# Patient Record
Sex: Male | Born: 1935 | Race: White | Hispanic: No | Marital: Single | Smoking: Former smoker
Health system: Southern US, Community
[De-identification: ages and names within clinical notes are randomized; demographics above are authoritative.]

## PROBLEM LIST (undated history)

## (undated) DIAGNOSIS — D649 Anemia, unspecified: Secondary | ICD-10-CM

## (undated) DIAGNOSIS — E876 Hypokalemia: Secondary | ICD-10-CM

## (undated) DIAGNOSIS — M1990 Unspecified osteoarthritis, unspecified site: Secondary | ICD-10-CM

## (undated) DIAGNOSIS — F0390 Unspecified dementia without behavioral disturbance: Secondary | ICD-10-CM

## (undated) DIAGNOSIS — I872 Venous insufficiency (chronic) (peripheral): Secondary | ICD-10-CM

## (undated) DIAGNOSIS — C4440 Unspecified malignant neoplasm of skin of scalp and neck: Secondary | ICD-10-CM

## (undated) DIAGNOSIS — J189 Pneumonia, unspecified organism: Secondary | ICD-10-CM

## (undated) DIAGNOSIS — I639 Cerebral infarction, unspecified: Secondary | ICD-10-CM

## (undated) DIAGNOSIS — E538 Deficiency of other specified B group vitamins: Secondary | ICD-10-CM

## (undated) DIAGNOSIS — Z8719 Personal history of other diseases of the digestive system: Secondary | ICD-10-CM

## (undated) DIAGNOSIS — E871 Hypo-osmolality and hyponatremia: Secondary | ICD-10-CM

## (undated) DIAGNOSIS — D485 Neoplasm of uncertain behavior of skin: Secondary | ICD-10-CM

## (undated) DIAGNOSIS — I714 Abdominal aortic aneurysm, without rupture, unspecified: Secondary | ICD-10-CM

## (undated) DIAGNOSIS — R634 Abnormal weight loss: Secondary | ICD-10-CM

## (undated) DIAGNOSIS — C679 Malignant neoplasm of bladder, unspecified: Secondary | ICD-10-CM

## (undated) DIAGNOSIS — C649 Malignant neoplasm of unspecified kidney, except renal pelvis: Secondary | ICD-10-CM

## (undated) DIAGNOSIS — I80299 Phlebitis and thrombophlebitis of other deep vessels of unspecified lower extremity: Secondary | ICD-10-CM

## (undated) DIAGNOSIS — E119 Type 2 diabetes mellitus without complications: Secondary | ICD-10-CM

## (undated) DIAGNOSIS — K625 Hemorrhage of anus and rectum: Secondary | ICD-10-CM

## (undated) DIAGNOSIS — M216X9 Other acquired deformities of unspecified foot: Secondary | ICD-10-CM

## (undated) DIAGNOSIS — I739 Peripheral vascular disease, unspecified: Secondary | ICD-10-CM

## (undated) DIAGNOSIS — K219 Gastro-esophageal reflux disease without esophagitis: Secondary | ICD-10-CM

## (undated) DIAGNOSIS — Z9181 History of falling: Secondary | ICD-10-CM

## (undated) DIAGNOSIS — M545 Low back pain: Secondary | ICD-10-CM

## (undated) DIAGNOSIS — N433 Hydrocele, unspecified: Secondary | ICD-10-CM

## (undated) DIAGNOSIS — M7020 Olecranon bursitis, unspecified elbow: Secondary | ICD-10-CM

## (undated) DIAGNOSIS — K5790 Diverticulosis of intestine, part unspecified, without perforation or abscess without bleeding: Secondary | ICD-10-CM

## (undated) DIAGNOSIS — R404 Transient alteration of awareness: Secondary | ICD-10-CM

## (undated) DIAGNOSIS — F329 Major depressive disorder, single episode, unspecified: Secondary | ICD-10-CM

## (undated) DIAGNOSIS — R609 Edema, unspecified: Secondary | ICD-10-CM

## (undated) DIAGNOSIS — I219 Acute myocardial infarction, unspecified: Secondary | ICD-10-CM

## (undated) DIAGNOSIS — I509 Heart failure, unspecified: Secondary | ICD-10-CM

## (undated) DIAGNOSIS — I10 Essential (primary) hypertension: Secondary | ICD-10-CM

## (undated) DIAGNOSIS — R269 Unspecified abnormalities of gait and mobility: Secondary | ICD-10-CM

## (undated) DIAGNOSIS — I4891 Unspecified atrial fibrillation: Secondary | ICD-10-CM

## (undated) DIAGNOSIS — I499 Cardiac arrhythmia, unspecified: Secondary | ICD-10-CM

## (undated) DIAGNOSIS — K5900 Constipation, unspecified: Secondary | ICD-10-CM

## (undated) DIAGNOSIS — E785 Hyperlipidemia, unspecified: Secondary | ICD-10-CM

## (undated) DIAGNOSIS — I2510 Atherosclerotic heart disease of native coronary artery without angina pectoris: Secondary | ICD-10-CM

## (undated) HISTORY — DX: Type 2 diabetes mellitus without complications: E11.9

## (undated) HISTORY — DX: Acute myocardial infarction, unspecified: I21.9

## (undated) HISTORY — DX: Abdominal aortic aneurysm, without rupture: I71.4

## (undated) HISTORY — DX: Constipation, unspecified: K59.00

## (undated) HISTORY — PX: APPENDECTOMY: SHX54

## (undated) HISTORY — DX: Personal history of other diseases of the digestive system: Z87.19

## (undated) HISTORY — DX: Unspecified atrial fibrillation: I48.91

## (undated) HISTORY — DX: Neoplasm of uncertain behavior of skin: D48.5

## (undated) HISTORY — DX: Hemorrhage of anus and rectum: K62.5

## (undated) HISTORY — DX: Heart failure, unspecified: I50.9

## (undated) HISTORY — DX: Peripheral vascular disease, unspecified: I73.9

## (undated) HISTORY — DX: Abdominal aortic aneurysm, without rupture, unspecified: I71.40

## (undated) HISTORY — DX: Edema, unspecified: R60.9

## (undated) HISTORY — DX: Low back pain: M54.5

## (undated) HISTORY — DX: Olecranon bursitis, unspecified elbow: M70.20

## (undated) HISTORY — DX: Unspecified abnormalities of gait and mobility: R26.9

## (undated) HISTORY — DX: Hydrocele, unspecified: N43.3

## (undated) HISTORY — DX: Transient alteration of awareness: R40.4

## (undated) HISTORY — DX: Hypo-osmolality and hyponatremia: E87.1

## (undated) HISTORY — DX: Unspecified dementia without behavioral disturbance: F03.90

## (undated) HISTORY — DX: Deficiency of other specified B group vitamins: E53.8

## (undated) HISTORY — DX: Other acquired deformities of unspecified foot: M21.6X9

## (undated) HISTORY — DX: Anemia, unspecified: D64.9

## (undated) HISTORY — PX: CORONARY ARTERY BYPASS GRAFT: SHX141

## (undated) HISTORY — DX: Malignant neoplasm of bladder, unspecified: C67.9

## (undated) HISTORY — DX: Pneumonia, unspecified organism: J18.9

## (undated) HISTORY — DX: Diverticulosis of intestine, part unspecified, without perforation or abscess without bleeding: K57.90

## (undated) HISTORY — DX: Malignant neoplasm of unspecified kidney, except renal pelvis: C64.9

## (undated) HISTORY — DX: Unspecified malignant neoplasm of skin of scalp and neck: C44.40

## (undated) HISTORY — DX: Cerebral infarction, unspecified: I63.9

## (undated) HISTORY — DX: Essential (primary) hypertension: I10

## (undated) HISTORY — DX: History of falling: Z91.81

## (undated) HISTORY — DX: Hypokalemia: E87.6

## (undated) HISTORY — DX: Venous insufficiency (chronic) (peripheral): I87.2

## (undated) HISTORY — DX: Abnormal weight loss: R63.4

## (undated) HISTORY — PX: EYE SURGERY: SHX253

## (undated) HISTORY — DX: Phlebitis and thrombophlebitis of other deep vessels of unspecified lower extremity: I80.299

## (undated) HISTORY — DX: Hyperlipidemia, unspecified: E78.5

## (undated) HISTORY — DX: Major depressive disorder, single episode, unspecified: F32.9

---

## 1979-06-02 DIAGNOSIS — I219 Acute myocardial infarction, unspecified: Secondary | ICD-10-CM

## 1979-06-02 DIAGNOSIS — I639 Cerebral infarction, unspecified: Secondary | ICD-10-CM

## 1979-06-02 HISTORY — DX: Cerebral infarction, unspecified: I63.9

## 1979-06-02 HISTORY — DX: Acute myocardial infarction, unspecified: I21.9

## 1992-06-01 HISTORY — PX: CORONARY ARTERY BYPASS GRAFT: SHX141

## 2004-06-01 HISTORY — PX: OTHER SURGICAL HISTORY: SHX169

## 2004-06-09 ENCOUNTER — Ambulatory Visit: Payer: Self-pay | Admitting: Internal Medicine

## 2004-08-18 ENCOUNTER — Ambulatory Visit: Payer: Self-pay | Admitting: Internal Medicine

## 2004-12-15 ENCOUNTER — Ambulatory Visit: Payer: Self-pay | Admitting: Internal Medicine

## 2004-12-22 ENCOUNTER — Ambulatory Visit: Payer: Self-pay | Admitting: Internal Medicine

## 2004-12-24 ENCOUNTER — Encounter: Admission: RE | Admit: 2004-12-24 | Discharge: 2004-12-24 | Payer: Self-pay | Admitting: Internal Medicine

## 2005-01-13 ENCOUNTER — Ambulatory Visit: Payer: Self-pay | Admitting: Internal Medicine

## 2005-04-08 DIAGNOSIS — C649 Malignant neoplasm of unspecified kidney, except renal pelvis: Secondary | ICD-10-CM

## 2005-04-08 HISTORY — DX: Malignant neoplasm of unspecified kidney, except renal pelvis: C64.9

## 2005-05-06 ENCOUNTER — Encounter: Admission: RE | Admit: 2005-05-06 | Discharge: 2005-05-06 | Payer: Self-pay | Admitting: Urology

## 2005-05-11 ENCOUNTER — Encounter (INDEPENDENT_AMBULATORY_CARE_PROVIDER_SITE_OTHER): Payer: Self-pay | Admitting: Specialist

## 2005-05-11 ENCOUNTER — Ambulatory Visit (HOSPITAL_COMMUNITY): Admission: RE | Admit: 2005-05-11 | Discharge: 2005-05-11 | Payer: Self-pay | Admitting: Urology

## 2005-05-11 ENCOUNTER — Ambulatory Visit (HOSPITAL_BASED_OUTPATIENT_CLINIC_OR_DEPARTMENT_OTHER): Admission: RE | Admit: 2005-05-11 | Discharge: 2005-05-11 | Payer: Self-pay | Admitting: Urology

## 2005-06-01 DIAGNOSIS — Z8719 Personal history of other diseases of the digestive system: Secondary | ICD-10-CM

## 2005-06-01 HISTORY — DX: Personal history of other diseases of the digestive system: Z87.19

## 2005-06-01 HISTORY — PX: CATARACT EXTRACTION: SUR2

## 2005-10-19 ENCOUNTER — Inpatient Hospital Stay (HOSPITAL_COMMUNITY): Admission: EM | Admit: 2005-10-19 | Discharge: 2005-10-20 | Payer: Self-pay | Admitting: Emergency Medicine

## 2005-10-22 ENCOUNTER — Ambulatory Visit: Payer: Self-pay | Admitting: Gastroenterology

## 2005-10-22 ENCOUNTER — Ambulatory Visit: Payer: Self-pay | Admitting: Internal Medicine

## 2005-11-04 ENCOUNTER — Ambulatory Visit: Payer: Self-pay | Admitting: Gastroenterology

## 2005-11-11 ENCOUNTER — Ambulatory Visit: Payer: Self-pay | Admitting: Gastroenterology

## 2005-11-27 ENCOUNTER — Encounter (INDEPENDENT_AMBULATORY_CARE_PROVIDER_SITE_OTHER): Payer: Self-pay | Admitting: *Deleted

## 2005-11-27 ENCOUNTER — Ambulatory Visit: Payer: Self-pay | Admitting: Gastroenterology

## 2005-11-27 LAB — HM COLONOSCOPY: HM Colonoscopy: ABNORMAL

## 2006-04-02 ENCOUNTER — Ambulatory Visit: Payer: Self-pay | Admitting: Internal Medicine

## 2006-07-19 ENCOUNTER — Ambulatory Visit: Payer: Self-pay | Admitting: Internal Medicine

## 2006-08-31 ENCOUNTER — Ambulatory Visit: Payer: Self-pay | Admitting: Internal Medicine

## 2006-12-13 ENCOUNTER — Ambulatory Visit (HOSPITAL_BASED_OUTPATIENT_CLINIC_OR_DEPARTMENT_OTHER): Admission: RE | Admit: 2006-12-13 | Discharge: 2006-12-13 | Payer: Self-pay | Admitting: Urology

## 2007-01-15 ENCOUNTER — Ambulatory Visit: Payer: Self-pay | Admitting: Family Medicine

## 2007-01-18 ENCOUNTER — Ambulatory Visit: Payer: Self-pay | Admitting: Internal Medicine

## 2007-01-19 ENCOUNTER — Ambulatory Visit: Payer: Self-pay | Admitting: Internal Medicine

## 2007-03-07 ENCOUNTER — Ambulatory Visit (HOSPITAL_BASED_OUTPATIENT_CLINIC_OR_DEPARTMENT_OTHER): Admission: RE | Admit: 2007-03-07 | Discharge: 2007-03-07 | Payer: Self-pay | Admitting: Urology

## 2007-04-01 ENCOUNTER — Ambulatory Visit: Payer: Self-pay | Admitting: Internal Medicine

## 2007-06-27 ENCOUNTER — Encounter: Payer: Self-pay | Admitting: Internal Medicine

## 2007-06-27 DIAGNOSIS — I10 Essential (primary) hypertension: Secondary | ICD-10-CM

## 2007-06-27 DIAGNOSIS — I719 Aortic aneurysm of unspecified site, without rupture: Secondary | ICD-10-CM | POA: Insufficient documentation

## 2007-06-27 DIAGNOSIS — I2510 Atherosclerotic heart disease of native coronary artery without angina pectoris: Secondary | ICD-10-CM

## 2007-06-27 DIAGNOSIS — K5730 Diverticulosis of large intestine without perforation or abscess without bleeding: Secondary | ICD-10-CM | POA: Insufficient documentation

## 2007-06-27 DIAGNOSIS — E1129 Type 2 diabetes mellitus with other diabetic kidney complication: Secondary | ICD-10-CM | POA: Insufficient documentation

## 2007-08-18 ENCOUNTER — Telehealth: Payer: Self-pay | Admitting: Internal Medicine

## 2007-09-27 ENCOUNTER — Encounter: Payer: Self-pay | Admitting: Internal Medicine

## 2007-11-18 ENCOUNTER — Telehealth: Payer: Self-pay | Admitting: Internal Medicine

## 2007-11-30 ENCOUNTER — Ambulatory Visit: Payer: Self-pay | Admitting: Internal Medicine

## 2007-12-01 DIAGNOSIS — E785 Hyperlipidemia, unspecified: Secondary | ICD-10-CM

## 2007-12-22 ENCOUNTER — Encounter: Payer: Self-pay | Admitting: Internal Medicine

## 2007-12-27 ENCOUNTER — Encounter: Payer: Self-pay | Admitting: Internal Medicine

## 2008-01-08 ENCOUNTER — Encounter: Payer: Self-pay | Admitting: Internal Medicine

## 2008-03-15 ENCOUNTER — Encounter: Payer: Self-pay | Admitting: Internal Medicine

## 2008-04-05 ENCOUNTER — Encounter: Payer: Self-pay | Admitting: Internal Medicine

## 2008-04-08 DIAGNOSIS — C679 Malignant neoplasm of bladder, unspecified: Secondary | ICD-10-CM

## 2008-04-08 HISTORY — DX: Malignant neoplasm of bladder, unspecified: C67.9

## 2008-04-18 ENCOUNTER — Encounter: Payer: Self-pay | Admitting: Internal Medicine

## 2008-06-28 ENCOUNTER — Encounter: Payer: Self-pay | Admitting: Internal Medicine

## 2008-08-15 ENCOUNTER — Telehealth: Payer: Self-pay | Admitting: Internal Medicine

## 2008-12-11 ENCOUNTER — Encounter: Payer: Self-pay | Admitting: Internal Medicine

## 2008-12-21 ENCOUNTER — Encounter: Admission: RE | Admit: 2008-12-21 | Discharge: 2008-12-21 | Payer: Self-pay | Admitting: Urology

## 2008-12-28 ENCOUNTER — Encounter (INDEPENDENT_AMBULATORY_CARE_PROVIDER_SITE_OTHER): Payer: Self-pay | Admitting: Urology

## 2008-12-28 ENCOUNTER — Ambulatory Visit (HOSPITAL_BASED_OUTPATIENT_CLINIC_OR_DEPARTMENT_OTHER): Admission: RE | Admit: 2008-12-28 | Discharge: 2008-12-28 | Payer: Self-pay | Admitting: Urology

## 2009-01-07 ENCOUNTER — Encounter: Payer: Self-pay | Admitting: Internal Medicine

## 2009-03-18 ENCOUNTER — Encounter: Payer: Self-pay | Admitting: Internal Medicine

## 2009-04-09 ENCOUNTER — Encounter: Payer: Self-pay | Admitting: Internal Medicine

## 2009-04-24 ENCOUNTER — Encounter: Payer: Self-pay | Admitting: Internal Medicine

## 2009-05-01 ENCOUNTER — Telehealth (INDEPENDENT_AMBULATORY_CARE_PROVIDER_SITE_OTHER): Payer: Self-pay | Admitting: *Deleted

## 2009-05-13 ENCOUNTER — Ambulatory Visit: Payer: Self-pay | Admitting: Internal Medicine

## 2009-05-14 DIAGNOSIS — C679 Malignant neoplasm of bladder, unspecified: Secondary | ICD-10-CM | POA: Insufficient documentation

## 2009-07-11 ENCOUNTER — Encounter: Payer: Self-pay | Admitting: Internal Medicine

## 2009-07-19 ENCOUNTER — Encounter: Payer: Self-pay | Admitting: Internal Medicine

## 2009-07-19 ENCOUNTER — Ambulatory Visit (HOSPITAL_BASED_OUTPATIENT_CLINIC_OR_DEPARTMENT_OTHER): Admission: RE | Admit: 2009-07-19 | Discharge: 2009-07-19 | Payer: Self-pay | Admitting: Urology

## 2009-07-30 ENCOUNTER — Encounter: Payer: Self-pay | Admitting: Internal Medicine

## 2009-08-28 ENCOUNTER — Telehealth: Payer: Self-pay | Admitting: Internal Medicine

## 2009-10-01 ENCOUNTER — Encounter: Payer: Self-pay | Admitting: Internal Medicine

## 2009-10-01 DIAGNOSIS — I2510 Atherosclerotic heart disease of native coronary artery without angina pectoris: Secondary | ICD-10-CM

## 2009-10-01 HISTORY — DX: Atherosclerotic heart disease of native coronary artery without angina pectoris: I25.10

## 2009-10-23 ENCOUNTER — Encounter: Payer: Self-pay | Admitting: Internal Medicine

## 2009-10-24 ENCOUNTER — Encounter: Payer: Self-pay | Admitting: Internal Medicine

## 2009-10-30 ENCOUNTER — Encounter: Payer: Self-pay | Admitting: Internal Medicine

## 2009-12-05 ENCOUNTER — Encounter: Payer: Self-pay | Admitting: Internal Medicine

## 2009-12-10 ENCOUNTER — Ambulatory Visit: Payer: Self-pay | Admitting: Internal Medicine

## 2010-01-30 ENCOUNTER — Encounter: Payer: Self-pay | Admitting: Internal Medicine

## 2010-02-21 ENCOUNTER — Encounter: Payer: Self-pay | Admitting: Internal Medicine

## 2010-02-21 ENCOUNTER — Ambulatory Visit (HOSPITAL_BASED_OUTPATIENT_CLINIC_OR_DEPARTMENT_OTHER): Admission: RE | Admit: 2010-02-21 | Discharge: 2010-02-21 | Payer: Self-pay | Admitting: Urology

## 2010-03-04 ENCOUNTER — Encounter: Payer: Self-pay | Admitting: Internal Medicine

## 2010-03-19 ENCOUNTER — Encounter: Payer: Self-pay | Admitting: Internal Medicine

## 2010-04-02 ENCOUNTER — Encounter: Payer: Self-pay | Admitting: Internal Medicine

## 2010-04-03 ENCOUNTER — Telehealth: Payer: Self-pay | Admitting: Internal Medicine

## 2010-04-04 ENCOUNTER — Ambulatory Visit: Payer: Self-pay | Admitting: Internal Medicine

## 2010-04-04 DIAGNOSIS — R279 Unspecified lack of coordination: Secondary | ICD-10-CM

## 2010-04-04 DIAGNOSIS — H8309 Labyrinthitis, unspecified ear: Secondary | ICD-10-CM | POA: Insufficient documentation

## 2010-04-08 ENCOUNTER — Ambulatory Visit: Payer: Self-pay | Admitting: Internal Medicine

## 2010-04-10 ENCOUNTER — Ambulatory Visit (HOSPITAL_COMMUNITY): Admission: RE | Admit: 2010-04-10 | Discharge: 2010-04-10 | Payer: Self-pay | Admitting: Internal Medicine

## 2010-04-10 ENCOUNTER — Encounter: Payer: Self-pay | Admitting: Internal Medicine

## 2010-04-15 ENCOUNTER — Encounter: Payer: Self-pay | Admitting: Internal Medicine

## 2010-04-18 ENCOUNTER — Ambulatory Visit: Payer: Self-pay | Admitting: Internal Medicine

## 2010-04-29 ENCOUNTER — Encounter: Payer: Self-pay | Admitting: Internal Medicine

## 2010-04-29 ENCOUNTER — Telehealth: Payer: Self-pay | Admitting: Internal Medicine

## 2010-04-30 ENCOUNTER — Encounter: Payer: Self-pay | Admitting: Internal Medicine

## 2010-05-02 ENCOUNTER — Encounter: Payer: Self-pay | Admitting: Internal Medicine

## 2010-05-20 ENCOUNTER — Ambulatory Visit: Payer: Self-pay | Admitting: Internal Medicine

## 2010-05-27 ENCOUNTER — Encounter: Payer: Self-pay | Admitting: Internal Medicine

## 2010-06-04 ENCOUNTER — Encounter: Payer: Self-pay | Admitting: Internal Medicine

## 2010-06-10 ENCOUNTER — Encounter: Payer: Self-pay | Admitting: Internal Medicine

## 2010-06-12 ENCOUNTER — Encounter: Payer: Self-pay | Admitting: Internal Medicine

## 2010-06-30 ENCOUNTER — Encounter
Admission: RE | Admit: 2010-06-30 | Discharge: 2010-06-30 | Payer: Self-pay | Source: Home / Self Care | Attending: Cardiovascular Disease | Admitting: Cardiovascular Disease

## 2010-07-03 NOTE — Miscellaneous (Signed)
Summary: Order/[facility]  Order/[facility]   Imported By: Lester [place] 04/04/2010 10:09:16  _____________________________________________________________________  External Attachment:    Type:   Image     Comment:   External Document

## 2010-07-03 NOTE — Miscellaneous (Signed)
Summary: Treatment plan/[facility]  Treatment plan/[facility]   Imported By: Sherian Rein 05/22/2010 07:46:42  _____________________________________________________________________  External Attachment:    Type:   Image     Comment:   External Document

## 2010-07-03 NOTE — Letter (Signed)
Summary: [facility]   Imported By: Sherian Rein 08/19/2009 08:31:59  _____________________________________________________________________  External Attachment:    Type:   Image     Comment:   External Document

## 2010-07-03 NOTE — Letter (Signed)
Summary: [facility]  [facility]   Imported By: Sherian Rein 06/12/2010 14:09:30  _____________________________________________________________________  External Attachment:    Type:   Image     Comment:   External Document

## 2010-07-03 NOTE — Letter (Signed)
Summary: [facility]  [facility]   Imported By: Lester [place] 07/18/2009 08:13:08  _____________________________________________________________________  External Attachment:    Type:   Image     Comment:   External Document

## 2010-07-03 NOTE — Letter (Signed)
Summary: [facility]  [facility]   Imported By: Sherian Rein 06/26/2010 10:55:57  _____________________________________________________________________  External Attachment:    Type:   Image     Comment:   External Document

## 2010-07-03 NOTE — Letter (Signed)
Summary: Medical Report forms/[facility]  Medical Report forms/[facility]   Imported By: Sherian Rein 12/18/2009 12:18:12  _____________________________________________________________________  External Attachment:    Type:   Image     Comment:   External Document

## 2010-07-03 NOTE — Assessment & Plan Note (Signed)
Summary: f/u to MRI/#/cd   Vital Signs:  Patient profile:   77 year old male Height:      69 inches Weight:      164 pounds BMI:     24.31 O2 Sat:      98 % on Room air Temp:     97.3 degrees F oral Pulse rate:   64 / minute BP sitting:   130 / 62  (left arm) Cuff size:   regular  Vitals Entered By: Bill Salinas CMA (April 18, 2010 11:23 AM)  O2 Flow:  Room air  Primary Care Provider:  Jacques Navy MD   History of Present Illness: [name] [name] returns for follow-up of his dizziness. He did have a negative MRI brain. He reports that the off balance issue remains but is intermittent and not severe.  HIs main concern is driving restrictions. A DMV form was completed that recommended no interstate drving, and no night-time driving. He is unhappy with this restriction of no night time driving. He reports that he took a driving test for his license and passed. He has seen his eye doctor and been given a clean bill of heath. When questioned I told him I restricted him to daytime driving due to the increased hazards of poor visibility in an elderly man with delayed, age approrpriate, reflexes. He does have a hearing Dec 6th in [place] with the [facility]  Current Medications (verified): 1)  Librium 10 Mg Caps (Chlordiazepoxide Hcl) .... Take 1 Tablet By Mouth Every 12 Hours As Needed 2)  Toprol Xl 50 Mg  Tb24 (Metoprolol Succinate) .... Take 1 By Mouth Qd 3)  Altace 10 Mg  Tabs (Ramipril) .... Take 1 By Mouth Qd 4)  Metformin Hcl 1000 Mg  Tabs (Metformin Hcl) .... Take 1 By Mouth Two Times A Day 5)  Norvasc 5 Mg  Tabs (Amlodipine Besylate) .... Take 1 By Mouth Qd 6)  Lipitor 10 Mg  Tabs (Atorvastatin Calcium) .... Take 1 Tablet By Mouth Once A Day 7)  Ecotrin Low Strength 81 Mg  Tbec (Aspirin) .... Take 1 Tablet By Mouth Once A Day 8)  Multivitamins   Tabs (Multiple Vitamin) .... Take 1 Tablet By Mouth Once A Day 9)  Vitamin C .... One Tablet Daily 10)  Levemir Flexpen 100 Unit/ml Soln  (Insulin Detemir) .... 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 - Dr. Tresa Endo                 GI        Dr. Lina Sar                 GU -    Dr. Ginette Pitman - Dr. Elmer Picker  Past Surgical History: Last updated: 11/30/2007 Coronary artery bypass graft-'94  LIMA-LAD, SVG-OM,PD, RCA Cataract extraction '07 EGD (11/27/2005) Cardiac Catherization (03/18/1993) Stress Cardiolite (10/06/04) 02/22/07 Lower extremity arterial doppler (1997) TUR-BT '06  Renal laser ablation of tumor '06  Family History: Last updated: 11/30/2007 non-contributory PMH-FH-SH reviewed-no changes except otherwise noted  Review of Systems  The patient denies anorexia, fever, vision loss, chest pain, syncope, dyspnea on exertion, abdominal pain, and muscle weakness.    Physical Exam  General:  Well-developed,well-nourished,in no acute distress; alert,appropriate and cooperative throughout examination Head:  Normocephalic and atraumatic without obvious abnormalities. No apparent alopecia or balding. Eyes:  pupils equal and pupils round.  C&S clear Lungs:  normal respiratory effort.   Heart:  normal rate and regular rhythm.   Neurologic:  alert & oriented X3, cranial nerves II-XII intact, and gait normal.   Psych:  Oriented X3, normally interactive, good eye contact, and not anxious appearing.     Impression & Recommendations:  Problem # 1:  ATAXIA (ICD-781.3) Continues to be a problem. MRI reviewed  and the report does state there may be old injury at the pons but no acute abnormality. Working diagnosis remains labyrinthitis although CNS based ataxia cannot be entirely ruled out.  Problem # 2:  driving restriction Patient will move ahead with appeal on nighttime driving restriction.  Complete Medication List: 1)  Librium 10 Mg Caps (Chlordiazepoxide hcl) .... Take 1 tablet by mouth every 12 hours as needed 2)  Toprol Xl 50 Mg Tb24 (Metoprolol succinate) .... Take 1 by mouth qd 3)  Altace 10 Mg Tabs (Ramipril) .... Take 1 by mouth qd 4)  Metformin Hcl 1000 Mg Tabs (Metformin hcl) .... Take 1 by mouth two times a day 5)  Norvasc 5 Mg Tabs (Amlodipine besylate) .... Take 1 by mouth qd 6)  Lipitor 10 Mg Tabs (Atorvastatin calcium) .... Take 1 tablet by mouth once a day 7)  Ecotrin Low Strength 81 Mg Tbec (Aspirin) .... Take 1 tablet by mouth once a day 8)  Multivitamins Tabs (Multiple vitamin) .... Take 1 tablet by mouth once a day 9)  Vitamin C  .... One tablet daily 10)  Levemir Flexpen 100 Unit/ml Soln (Insulin detemir) .... 20 unitss at bedtime, 3 day titration schedule Prescriptions: LEVEMIR FLEXPEN 100 UNIT/ML SOLN (INSULIN DETEMIR) 20 unitss at bedtime, 3 day titration schedule  #2 x 12   Entered and Authorized by:   Jacques Navy MD   Signed by:   Jacques Navy MD on 04/18/2010   Method used:   Electronically to        [facility]. [phone]* (retail)       [address]       [place], [place]  [address]       Ph: [phone]       Fax: [phone]   RxID:   [id number]    Orders Added: 1)  Est. Patient Level II [44010]

## 2010-07-03 NOTE — Progress Notes (Signed)
  Phone Note Refill Request Message from:  Fax from Pharmacy on April 29, 2010 3:26 PM     New/Updated Medications: NOVOFINE 32G X 6 MM MISC (INSULIN PEN NEEDLE) 1 three times a day Prescriptions: NOVOFINE 32G X 6 MM MISC (INSULIN PEN NEEDLE) 1 three times a day  #100 x 4   Entered by:   Ami Bullins CMA   Authorized by:   Jacques Navy MD   Signed by:   Bill Salinas CMA on 04/29/2010   Method used:   Electronically to        [facility]. [phone]* (retail)       [address]       [place], [place]  [address]       Ph: [phone]       Fax: [phone]   RxID:   [id number]

## 2010-07-03 NOTE — Progress Notes (Signed)
Summary: OT PT/scheduled appt/pt daughter wants to discontinue Actos.  Phone Note Other Incoming   Caller: [facility] [phone] Rosanne Summary of Call: Rosanne called to check the status of paperwork faxed over requesting MD ok PT and OT for pt for balance problems and repeated falls. Rosanne is requesting paperwork be faxed ASAP to  [phone].  Pt's daughter Junious Dresser also called from [phone] wanting to know if MEN would need to eval pt in office for balance and repeated falls? Please advise, has paperwork been recieved? Initial call taken by: Margaret Pyle, CMA,  April 03, 2010 11:38 AM  Follow-up for Phone Call        Paperwork concerning falls has been received and faxed back in to [facility]. Follow-up by: Daphane Shepherd,  April 03, 2010 11:39 AM  Additional Follow-up for Phone Call Additional follow up Details #1::        Facility called and scheduled an appt per pt daughter for falls. Appt scheduled for 04/04/10 at 2:50pm.  Facility stated pt daughter is requesting that Actos be discontinued as well. [facility]    Additional Follow-up for Phone Call Additional follow up Details #2::    pt here  for ov Follow-up by: Ami Bullins CMA,  April 04, 2010 4:04 PM

## 2010-07-03 NOTE — Letter (Signed)
Summary: [facility]  [facility]   Imported By: Lester [place] 11/12/2009 12:36:53  _____________________________________________________________________  External Attachment:    Type:   Image     Comment:   External Document

## 2010-07-03 NOTE — Letter (Signed)
Summary: [facility]   Imported By: Lester [place] 04/02/2010 09:24:51  _____________________________________________________________________  External Attachment:    Type:   Image     Comment:   External Document

## 2010-07-03 NOTE — Letter (Signed)
Summary: [facility]  [facility]   Imported By: Lester [place] 11/12/2009 11:56:16  _____________________________________________________________________  External Attachment:    Type:   Image     Comment:   External Document

## 2010-07-03 NOTE — Letter (Signed)
Summary: [facility]   Imported By: Sherian Rein 03/11/2010 14:52:14  _____________________________________________________________________  External Attachment:    Type:   Image     Comment:   External Document

## 2010-07-03 NOTE — Assessment & Plan Note (Signed)
Summary: office visit to discuss elevated A1C   Vital Signs:  Patient profile:   77 year old male Height:      69 inches Weight:      168 pounds BMI:     24.90 O2 Sat:      98 % on Room air Temp:     97.4 degrees F oral Pulse rate:   62 / minute BP sitting:   128 / 60  (left arm) Cuff size:   regular  Vitals Entered By: Bill Salinas CMA (May 20, 2010 9:02 AM)  O2 Flow:  Room air CC: ov to discuss hemoglobin A1C/ ab   Primary Care Provider:  Jacques Navy MD  CC:  ov to discuss hemoglobin A1C/ ab.  History of Present Illness: patinet presents for management of diabetes. His lab Dec 1 st revealed an A1C of 8.1%. Reviewed chart: in November he was started on levemir. His fasting blood sugars are in the 80's. The A1C also included Sept, Oct when he was not on insulin.   Current Medications (verified): 1)  Librium 10 Mg Caps (Chlordiazepoxide Hcl) .... Take 1 Tablet By Mouth Every 12 Hours As Needed 2)  Toprol Xl 50 Mg  Tb24 (Metoprolol Succinate) .... Take 1 By Mouth Qd 3)  Altace 10 Mg  Tabs (Ramipril) .... Take 1 By Mouth Qd 4)  Metformin Hcl 1000 Mg  Tabs (Metformin Hcl) .... Take 1 By Mouth Two Times A Day 5)  Norvasc 5 Mg  Tabs (Amlodipine Besylate) .... Take 1 By Mouth Qd 6)  Lipitor 10 Mg  Tabs (Atorvastatin Calcium) .... Take 1 Tablet By Mouth Once A Day 7)  Ecotrin Low Strength 81 Mg  Tbec (Aspirin) .... Take 1 Tablet By Mouth Once A Day 8)  Multivitamins   Tabs (Multiple Vitamin) .... Take 1 Tablet By Mouth Once A Day 9)  Vitamin C .... One Tablet Daily 10)  Levemir Flexpen 100 Unit/ml Soln (Insulin Detemir) .... 20 Unitss At Bedtime, 3 Day Titration Schedule 11)  Novofine 32g X 6 Mm Misc (Insulin Pen Needle) .Marland Kitchen.. 1 Three Times A Day  Allergies (verified): No Known Drug Allergies   Impression & Recommendations:  Problem # 1:  DIABETES MELLITUS, TYPE II (ICD-250.00) Reivewed patients record of A1C is very good.   Plan - continue present regimen.   His  updated medication list for this problem includes:    Altace 10 Mg Tabs (Ramipril) .Marland Kitchen... Take 1 by mouth qd    Metformin Hcl 1000 Mg Tabs (Metformin hcl) .Marland Kitchen... Take 1 by mouth two times a day    Ecotrin Low Strength 81 Mg Tbec (Aspirin) .Marland Kitchen... Take 1 tablet by mouth once a day    Levemir Flexpen 100 Unit/ml Soln (Insulin detemir) .Marland Kitchen... 20 unitss at bedtime, 3 day titration schedule  Complete Medication List: 1)  Librium 10 Mg Caps (Chlordiazepoxide hcl) .... Take 1 tablet by mouth every 12 hours as needed 2)  Toprol Xl 50 Mg Tb24 (Metoprolol succinate) .... Take 1 by mouth qd 3)  Altace 10 Mg Tabs (Ramipril) .... Take 1 by mouth qd 4)  Metformin Hcl 1000 Mg Tabs (Metformin hcl) .... Take 1 by mouth two times a day 5)  Norvasc 5 Mg Tabs (Amlodipine besylate) .... Take 1 by mouth qd 6)  Lipitor 10 Mg Tabs (Atorvastatin calcium) .... Take 1 tablet by mouth once a day 7)  Ecotrin Low Strength 81 Mg Tbec (Aspirin) .... Take 1 tablet by mouth once a  day 8)  Multivitamins Tabs (Multiple vitamin) .... Take 1 tablet by mouth once a day 9)  Vitamin C  .... One tablet daily 10)  Levemir Flexpen 100 Unit/ml Soln (Insulin detemir) .... 20 unitss at bedtime, 3 day titration schedule 11)  Novofine 32g X 6 Mm Misc (Insulin pen needle) .Marland Kitchen.. 1 three times a day   Orders Added: 1)  Est. Patient Level II [[address]109]

## 2010-07-03 NOTE — Letter (Signed)
Summary: [facility]  [facility]   Imported By: Sherian Rein 06/17/2010 09:19:54  _____________________________________________________________________  External Attachment:    Type:   Image     Comment:   External Document

## 2010-07-03 NOTE — Progress Notes (Signed)
  Phone Note Refill Request Message from:  Fax from Pharmacy on August 28, 2009 2:07 PM  Refills Requested: Medication #1:  LIBRIUM 10 MG CAPS Take 1 tablet by mouth every 12 hours as needed received fax from [facility] [address], please Advise Refill thanks  Initial call taken by: Ami Bullins CMA,  August 28, 2009 2:08 PM  Follow-up for Phone Call        ok to refill x 5 Follow-up by: Jacques Navy MD,  August 28, 2009 2:37 PM    Prescriptions: LIBRIUM 10 MG CAPS (CHLORDIAZEPOXIDE HCL) Take 1 tablet by mouth every 12 hours as needed  #100 x 5   Entered by:   Ami Bullins CMA   Authorized by:   Jacques Navy MD   Signed by:   Bill Salinas CMA on 08/28/2009   Method used:   Electronically to        [facility]. [phone]* (retail)       [address]       [place], [place]  [address]       Ph: [phone]       Fax: [phone]   RxID:   [id number]   Appended Document:  Spoke with pharmacy and verified prescription.

## 2010-07-03 NOTE — Letter (Signed)
   [facility] [address] [place], [place]  [address] Phone: [phone]      April 15, 2010   [facility] [address] [place], [place] [address]  RE:  LAB RESULTS  Dear  Mr. ALMS,  The following is an interpretation of your most recent lab tests.  Please take note of any instructions provided or changes to medications that have resulted from your lab work. MRI of the brain reveals chronic changes of hardened arteries in the Pons = brain stem but no acute stroke. MRA shows normal blood vessels.  Possible but not sure that your poor balance is related to these changes. continue physical therapy to improve gait and balance.  Please come see me if you have any questions about these lab results.   Sincerely Yours,    Jacques Navy MD

## 2010-07-03 NOTE — Letter (Signed)
Summary: [facility]   Imported By: Lester [place] 06/20/2010 08:58:51  _____________________________________________________________________  External Attachment:    Type:   Image     Comment:   External Document

## 2010-07-03 NOTE — Assessment & Plan Note (Signed)
Summary: increased falls-lb   Vital Signs:  Patient profile:   77 year old male Height:      69 inches Weight:      161 pounds BMI:     23.86 O2 Sat:      97 % on Room air Temp:     98.1 degrees F oral Pulse rate:   77 / minute BP sitting:   112 / 64  (left arm) Cuff size:   regular  Vitals Entered By: Bill Salinas CMA (April 04, 2010 4:09 PM)  O2 Flow:  Room air CC: pt here for evaluation of frequent falls/ he will get pneumonia vaccine today/ Pt has also been on round of amoxicillen and mucinex for a cold/ ab   Primary Care Provider:  Jacques Navy MD  CC:  pt here for evaluation of frequent falls/ he will get pneumonia vaccine today/ Pt has also been on round of amoxicillen and mucinex for a cold/ ab.  History of Present Illness: Patient had a bad fall Tuesday sustaining laceration to the lip with facial  bruising. He was seen at [facility] for repair. He had chest x-ray and wrist films that by patient report were normal. He had another fall Wednsday. He was walking back to his room and fell again. He had no LOC, no prodroma, no focal weakness. He was not injured. His daughter is with him and confirms this history: specifically no LOC at time of fall.  Current Medications (verified): 1)  Actos 15 Mg Tabs (Pioglitazone Hcl) .Marland Kitchen.. 1 By Mouth Once Daily 2)  Librium 10 Mg Caps (Chlordiazepoxide Hcl) .... Take 1 Tablet By Mouth Every 12 Hours As Needed 3)  Toprol Xl 50 Mg  Tb24 (Metoprolol Succinate) .... Take 1 By Mouth Qd 4)  Altace 10 Mg  Tabs (Ramipril) .... Take 1 By Mouth Qd 5)  Metformin Hcl 1000 Mg  Tabs (Metformin Hcl) .... Take 1 By Mouth Two Times A Day 6)  Norvasc 5 Mg  Tabs (Amlodipine Besylate) .... Take 1 By Mouth Qd 7)  Lipitor 10 Mg  Tabs (Atorvastatin Calcium) .... Take 1 Tablet By Mouth Once A Day 8)  Ecotrin Low Strength 81 Mg  Tbec (Aspirin) .... Take 1 Tablet By Mouth Once A Day 9)  Multivitamins   Tabs (Multiple Vitamin) .... Take 1 Tablet By Mouth  Once A Day 10)  Vitamin C .... 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 - Dr. Tresa Endo                 GI        Dr. Lina Sar                 GU -    Dr. Ginette Pitman - Dr. 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  exercises daily End-of-Life: DNR, DNI unless reversible illness; no heoric measures to artificially sustain life.   Review of Systems  The patient denies anorexia, fever, weight loss, weight gain, decreased hearing, chest pain, dyspnea on exertion, prolonged cough, abdominal pain, hematuria, incontinence, muscle weakness, difficulty walking, unusual weight change, and abnormal bleeding.    Physical Exam  General:  alert, well-developed, and well-nourished.   Head:  normocephalic and atraumatic.   Eyes:  vision grossly intact, pupils equal, and pupils round.   Neck:  supple and full ROM.   Lungs:  normal respiratory effort and normal breath sounds.   Heart:  normal rate and regular rhythm.   Msk:  normal ROM, no  joint tenderness, no joint swelling, and no joint deformities.   Pulses:  2+ radial Neurologic:  alert & oriented X3, cranial nerves II-XII intact, and strength normal in all extremities.  ataxic gait and unable to do a tandem gait. Skin:  bruised and swollen lower lip. bruised chin and face. Psych:  Oriented X3, memory intact for recent and remote, normally interactive, and not anxious appearing.     Impression & Recommendations:  Problem # 1:  LABYRINTHITIS (ICD-386.30) Neuro exam is unremarkable except for ataxia which is most likely due to labyrinthitis.  Plan meclizine 12.5 mg q 6 for dizziness.        for persistent symptoms will need to have neuro-imaging to r/o any neurologic injury.  Problem # 2:  DIABETES MELLITUS, TYPE II (ICD-250.00) Patient needs to stop actos for cardiac reasons. He has not had good control.  Plan - start basal insulin therapy with levemir. Pt instructed in a 3 day titration cycle - see pt instruction.  The following medications were removed from the medication list:    Actos 15 Mg Tabs (Pioglitazone hcl) .Marland Kitchen... 1 by mouth once daily His updated medication list for this problem includes:    Altace 10 Mg Tabs (Ramipril) .Marland Kitchen... Take 1 by mouth qd    Metformin Hcl 1000 Mg Tabs (Metformin hcl) .Marland Kitchen... Take 1 by mouth two times a day    Ecotrin Low Strength 81 Mg Tbec (Aspirin) .Marland Kitchen... Take 1 tablet by mouth once a day    Levemir Flexpen 100 Unit/ml Soln (Insulin detemir) .Marland Kitchen... 20 unitss at bedtime, 3 day titration schedule  Complete Medication List: 1)  Librium 10 Mg Caps (Chlordiazepoxide hcl) .... Take 1 tablet by mouth every 12 hours as needed 2)  Toprol Xl 50 Mg Tb24 (Metoprolol succinate) .... Take 1 by mouth qd 3)  Altace 10 Mg Tabs (Ramipril) .... Take 1 by mouth qd 4)  Metformin Hcl 1000 Mg Tabs (Metformin hcl) .... Take 1 by mouth two times a day 5)  Norvasc 5 Mg Tabs (Amlodipine besylate) .... Take 1 by mouth qd 6)  Lipitor 10 Mg Tabs (Atorvastatin  calcium) .... Take 1 tablet by mouth once a day 7)  Ecotrin Low Strength 81 Mg Tbec (Aspirin) .... Take 1 tablet by mouth once a day 8)  Multivitamins Tabs (Multiple vitamin) .... Take 1 tablet by mouth once a day 9)  Vitamin C  .... One tablet daily 10)  Levemir Flexpen 100 Unit/ml Soln (Insulin detemir) .... 20 unitss at bedtime, 3 day titration schedule  Other  Orders: Pneumococcal Vaccine (16109) Admin 1st Vaccine (60454) Radiology Referral (Radiology)  Patient Instructions: 1)  Loss of balance - on exam there are problems with gait and balance. May be "inner ear" but the findings make it necessary to rule out any possibility of a small stroke affecting the cerebellum or brainstem. Plan - MRI brain and MRA of the intracranial vessels. This important because it may dictate using a more effective blood thinner than low dose aspirin 2)  Diabetes - poor control and need to come off actos. Plan - basal insulin therapy using Levemir starting at 20 units administered at bedtime. You will need to check your fasting blood sugar every morning. If the glucose is greater than 150 3 days in a row increase the levemir by 3 units. Repeat the same cycle until we have control or you get to  35 units. Continue taking metformin.    Orders Added: 1)  Pneumococcal Vaccine [90732] 2)  Admin 1st Vaccine [90471] 3)  Est. Patient Level III [09811] 4)  Radiology Referral [Radiology] 5)  Est. Patient Level IV [91478]   Immunizations Administered:  Pneumonia Vaccine:    Vaccine Type: Pneumovax    Site: left deltoid    Mfr: Merck    Dose: 0.5 ml    Route: IM    Given by: Lamar Sprinkles, CMA    Exp. Date: 09/26/2011    Lot #: [id number]    VIS given: 12/28/95 version given April 04, 2010.   Immunizations Administered:  Pneumonia Vaccine:    Vaccine Type: Pneumovax    Site: left deltoid    Mfr: Merck    Dose: 0.5 ml    Route: IM    Given by: Lamar Sprinkles, CMA    Exp. Date: 09/26/2011    Lot #:  [id number]    VIS given: 12/28/95 version given April 04, 2010.

## 2010-07-03 NOTE — Letter (Signed)
Summary: [facility]  [facility]   Imported By: Lester [place] 02/06/2010 09:33:14  _____________________________________________________________________  External Attachment:    Type:   Image     Comment:   External Document

## 2010-07-03 NOTE — Miscellaneous (Signed)
Summary: [facility]   Imported By: Lester [place] 04/15/2010 08:35:37  _____________________________________________________________________  External Attachment:    Type:   Image     Comment:   External Document

## 2010-07-03 NOTE — Medication Information (Signed)
Summary: Diabetic supplies/[facility]  Diabetic supplies/[facility]   Imported By: Lester [place] 05/07/2010 08:57:16  _____________________________________________________________________  External Attachment:    Type:   Image     Comment:   External Document

## 2010-07-04 ENCOUNTER — Ambulatory Visit (HOSPITAL_COMMUNITY)
Admission: RE | Admit: 2010-07-04 | Discharge: 2010-07-04 | Disposition: A | Discharge status: Home / Self Care | Payer: Medicare Other | Source: Ambulatory Visit | Attending: Cardiovascular Disease | Admitting: Cardiovascular Disease

## 2010-07-04 DIAGNOSIS — Z951 Presence of aortocoronary bypass graft: Secondary | ICD-10-CM | POA: Insufficient documentation

## 2010-07-04 DIAGNOSIS — I2510 Atherosclerotic heart disease of native coronary artery without angina pectoris: Secondary | ICD-10-CM | POA: Insufficient documentation

## 2010-07-04 DIAGNOSIS — I4891 Unspecified atrial fibrillation: Secondary | ICD-10-CM | POA: Insufficient documentation

## 2010-07-04 DIAGNOSIS — Z01810 Encounter for preprocedural cardiovascular examination: Secondary | ICD-10-CM | POA: Insufficient documentation

## 2010-07-04 DIAGNOSIS — Z7901 Long term (current) use of anticoagulants: Secondary | ICD-10-CM | POA: Insufficient documentation

## 2010-07-04 DIAGNOSIS — I252 Old myocardial infarction: Secondary | ICD-10-CM | POA: Insufficient documentation

## 2010-07-04 HISTORY — DX: Unspecified atrial fibrillation: I48.91

## 2010-07-04 LAB — GLUCOSE, CAPILLARY: Glucose-Capillary: 57 mg/dL — ABNORMAL LOW (ref 70–99)

## 2010-07-13 NOTE — Op Note (Signed)
  NAME:  [name] [name], [name] [name] NO.:  [id number]  MEDICAL RECORD NO.:  [id number]           PATIENT TYPE:  O  LOCATION:  CATH                         FACILITY:  [facility]  PHYSICIAN:  Nicki Guadalajara, M.D.     DATE OF BIRTH:  Jun 28, 1920  DATE OF PROCEDURE:  07/04/2010 DATE OF DISCHARGE:  07/04/2010                              OPERATIVE REPORT   INDICATIONS:  Mr. [name] is an 77 year old gentleman who has known CAD and suffered an inferior wall myocardial infarction in 1981.  He underwent CABG surgery in 1994.  He recently was found to be in atrial fibrillation, questionable duration, in November at which time he was started on Coumadin anticoagulation as well as amiodarone.  He has been documented to have therapeutic anticoagulation for over a month and now presents for outpatient DC cardioversion.  Baseline ECG shows atrial fibrillation but a ventricular rate in the 60s.  Anesthesia was provided by Dr. Gypsy Balsam, and the patient received 80 mg of Diprivan intravenously.  Biphasic synchronized cardioversion was done at 120 joules using AP pads with restoration of sinus rhythm.  The patient tolerated the procedure well.  The 12-lead ECG confirmed sinus bradycardia at 56 beats per minute with first-degree AV block with a PR interval 248 milliseconds.  The patient tolerated the procedure well.  CONCLUSION:  Successful DC cardioversion with restoration of normal sinus rhythm from atrial fibrillation.          ______________________________ Nicki Guadalajara, M.D.     TK/MEDQ  D:  07/04/2010  T:  07/05/2010  Job:  [id number]  Electronically Signed by Nicki Guadalajara M.D. on 07/10/2010 04:16:14 PM

## 2010-07-23 ENCOUNTER — Encounter: Payer: Self-pay | Admitting: Internal Medicine

## 2010-08-14 LAB — POCT I-STAT 4, (NA,K, GLUC, HGB,HCT)
Glucose, Bld: 212 mg/dL — ABNORMAL HIGH (ref 70–99)
HCT: 42 % (ref 39.0–52.0)
Hemoglobin: 14.3 g/dL (ref 13.0–17.0)
Potassium: 4.1 mEq/L (ref 3.5–5.1)
Sodium: 134 mEq/L — ABNORMAL LOW (ref 135–145)

## 2010-08-14 LAB — GLUCOSE, CAPILLARY: Glucose-Capillary: 199 mg/dL — ABNORMAL HIGH (ref 70–99)

## 2010-08-19 NOTE — Letter (Signed)
Summary: [facility]  [facility]   Imported By: Sherian Rein 08/13/2010 11:18:06  _____________________________________________________________________  External Attachment:    Type:   Image     Comment:   External Document

## 2010-08-20 LAB — POCT I-STAT 4, (NA,K, GLUC, HGB,HCT)
Glucose, Bld: 243 mg/dL — ABNORMAL HIGH (ref 70–99)
HCT: 40 % (ref 39.0–52.0)
Hemoglobin: 13.6 g/dL (ref 13.0–17.0)
Potassium: 4.1 mEq/L (ref 3.5–5.1)
Sodium: 131 mEq/L — ABNORMAL LOW (ref 135–145)

## 2010-08-20 LAB — GLUCOSE, CAPILLARY: Glucose-Capillary: 225 mg/dL — ABNORMAL HIGH (ref 70–99)

## 2010-09-07 LAB — GLUCOSE, CAPILLARY
Glucose-Capillary: 204 mg/dL — ABNORMAL HIGH (ref 70–99)
Glucose-Capillary: 208 mg/dL — ABNORMAL HIGH (ref 70–99)
Glucose-Capillary: 222 mg/dL — ABNORMAL HIGH (ref 70–99)

## 2010-09-07 LAB — POCT HEMOGLOBIN-HEMACUE: Hemoglobin: 15.3 g/dL (ref 13.0–17.0)

## 2010-09-07 LAB — BASIC METABOLIC PANEL
BUN: 10 mg/dL (ref 6–23)
CO2: 24 mEq/L (ref 19–32)
Calcium: 9.3 mg/dL (ref 8.4–10.5)
Chloride: 97 mEq/L (ref 96–112)
Creatinine, Ser: 0.9 mg/dL (ref 0.4–1.5)
GFR calc Af Amer: >60 mL/min (ref >60)
GFR calc non Af Amer: >60 mL/min (ref >60)
Glucose, Bld: 303 mg/dL — ABNORMAL HIGH (ref 70–99)
Potassium: 4.6 mEq/L (ref 3.5–5.1)
Sodium: 131 mEq/L — ABNORMAL LOW (ref 135–145)

## 2010-10-02 ENCOUNTER — Telehealth: Payer: Self-pay | Admitting: *Deleted

## 2010-10-02 ENCOUNTER — Ambulatory Visit (INDEPENDENT_AMBULATORY_CARE_PROVIDER_SITE_OTHER): Payer: Medicare Other | Admitting: Internal Medicine

## 2010-10-02 ENCOUNTER — Encounter: Payer: Self-pay | Admitting: *Deleted

## 2010-10-02 VITALS — BP 122/60 | HR 47 | Temp 97.0°F | Wt 146.0 lb

## 2010-10-02 DIAGNOSIS — R634 Abnormal weight loss: Secondary | ICD-10-CM

## 2010-10-02 DIAGNOSIS — R001 Bradycardia, unspecified: Secondary | ICD-10-CM

## 2010-10-02 DIAGNOSIS — E119 Type 2 diabetes mellitus without complications: Secondary | ICD-10-CM

## 2010-10-02 DIAGNOSIS — I498 Other specified cardiac arrhythmias: Secondary | ICD-10-CM

## 2010-10-02 LAB — GLUCOSE, POCT (MANUAL RESULT ENTRY): POC Glucose: 119

## 2010-10-02 MED ORDER — ESOMEPRAZOLE MAGNESIUM 40 MG PO CPDR: 40.0000 mg | DELAYED_RELEASE_CAPSULE | Freq: Every day | ORAL | Status: DC

## 2010-10-02 NOTE — Patient Instructions (Signed)
1. Diabetes - with your weight loss the insulin and metformin doses are too high. Plan - reduce levemir to 210 units at bedtime. Reduce metformin to 500mg  (1/2 tablet) AM and PM. 2. Heart rate - call Dr. Landry Dyke office about stopping the metoprolol 3. Weight loss - sounds like it is due to decreased calorie intake. Plan - resume taking something to protect your stomach from acid irritation - Nexium 40mg  every morning - new Rx sent in. If you miss meals you should have available glucerna supplement for diabetics or something like a protein bar. Please do not loose any more weight. Return in 1 month for a weigh in.

## 2010-10-02 NOTE — Telephone Encounter (Signed)
Spoke w/daughter - Pt has not been checking his cbg's but 2 recent dr's apts pt's cbg was between 50 and 60's. Per daughter - pt was been sleeping a lot lately, has decreased appetite and lost approx 12 lbs over the last few mths. Scheduled for OV today to discuss.

## 2010-10-03 ENCOUNTER — Ambulatory Visit: Payer: Medicare Other | Admitting: Internal Medicine

## 2010-10-05 ENCOUNTER — Encounter: Payer: Self-pay | Admitting: Internal Medicine

## 2010-10-05 NOTE — Progress Notes (Signed)
Subjective:    Patient ID: [name] [name], male    DOB: 08-Jan-1921, 77 y.o.   MRN: [id number]  HPI [name] [name] is an 77 y/o who is followed for diabetes with basal insulin therapy and oral metformin. He presents due to several episodes of hypoglycemia. Since the initiation of this regimen he has lost considerable weight - 22lbs in less than 6 months. He has been experiencing increased fatigue and per his daughter, present today as adjunct historian, worsening confusion, ataxia with falls and very poor calorie intake. It seems he is sleeping through mealtime but when taken out to eat has a good appetite. He denies Nausea, dysphagia, odynophagia but does have diminished appetite.    He has been bradycardic and his metoprolol was recently reduced from 50 to 25mg  daily. He continues to be bradycardic. He is followed closely by his cardiologist - Dr. Marolyn Haller.  Past Medical History  Diagnosis Date  . CVA (cerebral infarction) 1981    MI  . Type II or unspecified type diabetes mellitus without mention of complication, not stated as uncontrolled   . Diverticulosis   . History of GI diverticular bleed 2007  . HTN (hypertension)   . AAA (abdominal aortic aneurysm)     CT 2006 3.2 cm  . Bladder cancer     Transitional cell carcinoma excised 2006  . Renal malignant tumor     transitional cell carcinoma lasered 2006  . Hyperlipidemia   . Hydrocele, left   . Inguinal hernia    Past Surgical History  Procedure Date  . Coronary artery bypass graft '94    LIMA-LAD, SVG-OM, PD, RCA  . Cataract extraction 2007  . Renal laser ablation of tumor 2006   No family history on file. History   Social History  . Marital Status: Single    Spouse Name: N/A    Number of Children: N/A  . Years of Education: N/A   Occupational History  . Retired    Social History Main Topics  . Smoking status: Not on file  . Smokeless tobacco: Not on file  . Alcohol Use: Not on file  . Drug Use: Not on file  .  Sexually Active: Not on file   Other Topics Concern  . 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  Regular Exercise -  YES.  END OF LIFE: DNR, DNI unless reversible illness; no heroic measures to artificially sustain life.        Review of Systems Review of Systems  Constitutional:  Negative for fever, chills, activity change. Marked unexpected weight change with 22 lbs weight loss.  HENT: Positive for mild hearing loss, negative for  ear pain, congestion, neck stiffness and postnasal drip.   Eyes: Negative for pain, discharge and visual disturbance.  Respiratory: Negative for chest tightness and wheezing.   Cardiovascular: Negative for chest pain and palpitations.       [No decreased exercise tolerance Gastrointestinal: [No change in bowel habit. No bloating or gas. No reflux or indigestion Genitourinary: Negative for urgency, frequency, flank pain and difficulty urinating.  Musculoskeletal: Negative for myalgias, back pain, arthralgias and gait problem.  Neurological: Negative for dizziness, tremors, weakness and headaches.  Hematological: Negative for adenopathy.  Psychiatric/Behavioral: Negative for behavioral problems and dysphoric mood.       Objective:   Physical Exam Vitals reviewed - bradycardia noted Elderly white male who is thin but no gaunt in  no distress HEENT - [place]/AT, C&S clear Neck - supple Nodes - negative Chest - lungs are clear Cor - regular bradycardia Abdomen - BS +, no HSM, no tenderness Neuro - A&O x 3, CN II-XII grossly intact.        Assessment & Plan:  1. - hypoglycemia - needs medication dose adjustment in accord with weight loss.  Plan - reduce Lantus to 10 units qhs           Reduce metformin to 500mg  bid.           Continue to monitor CBGs  2. Weight loss - very significant amount of weight loss with no GI symptoms. He does admit to poor intake due to many missed meals.  Plan -  for any missed meal he is to use Glucerna or similar product           Weight check in 4 weeks - for continued loss will need further GI evaluation.  3. Bradycardia - advised the patient, and his daughter, to contact Dr. Landry Dyke office in regard to stopping metoprolol

## 2010-10-14 NOTE — Op Note (Signed)
NAME:  [name] [name], [name] [name]                  ACCOUNT NO.:  [id number]   MEDICAL RECORD NO.:  [id number]          PATIENT TYPE:  AMB   LOCATION:  [facility]                         FACILITY:  [facility]   PHYSICIAN:  Mark C. Vernie Ammons, M.D.  DATE OF BIRTH:  1921-04-24   DATE OF PROCEDURE:  12/28/2008  DATE OF DISCHARGE:                               OPERATIVE REPORT   PREOPERATIVE DIAGNOSES:  1. Bladder tumor.  2. History of renal pelvic transitional cell carcinoma.   POSTOPERATIVE DIAGNOSES:  1. Bladder tumor.  2. History of renal pelvic transitional cell carcinoma.   PROCEDURE.:  1. Diagnostic cystoscopy.  2. Transurethral resection of bladder tumor.  3. Bilateral ureteral catheterization.  4. Bilateral retrograde pyelogram with interpretation.   SURGEON:  Mark C. Vernie Ammons, M.D.   ANESTHESIA:  General.   SPECIMENS:  Bladder tumor to pathology.   BLOOD LOSS:  Zero.   DRAINS:  None.   COMPLICATIONS:  None.   INDICATIONS:  The patient is an 77 year old male who has a history of  transitional cell carcinoma of the bladder.  He was originally found to  have a low grade papillary noninvasive tumor which was resected in  December 2006.  He was then found to have a filling defect in the right  renal pelvis and this was treated endoscopically, also in December 2006.  It was found to be low-grade and he has been followed with upper and  lower tract studies.  A recent CT scan with contrast did not delineate  the distal ureters completely.  However, no hydronephrosis was noted.  A  surveillance cystoscopy revealed a recurrence on the bladder floor, just  posterior to the right ureteral orifice.  He was brought to the  operating room for bilateral retrograde pyelograms and resection of the  tumor.  He understands the risks, complications, alternatives and  limitations.   DESCRIPTION OF OPERATION:  After informed consent, the patient was  brought to the major OR, placed on the table, administered  general  anesthesia and then moved to the dorsal lithotomy position.  His  genitalia were sterilely prepped and draped.  An official time-out was  then performed.   Initially the 21-French cystoscope with 12-degree lens was passed per  urethra, which was noted be normal down to the sphincter, which was  intact.  The prostatic urethra revealed no lesions.  There was some  bilobar hypertrophy with some elongation of the prostatic urethra.  Upon  entering the bladder, I noted mild trabeculation.  The bladder tumor was  identified and appeared to be papillary.  It was located in two areas,  one was lateral and the other was medial to the course of the ureter,  but both were posteriorly located relative to the right ureteral  orifice.  Full and systematic inspection of the bladder revealed no  other tumor, stones or inflammatory lesions.   Bilateral retrograde pyelograms were then performed in the standard  fashion, using direct fluoroscopy throughout the procedure and full-  strength contrast.  I passed a 6-French open-ended ureteral catheter  through the cystoscope and first  into the left ureteral orifice.  After  cannulating the left ureter, the retrograde pyelogram was performed and  revealed a normal ureter throughout its length.  The collecting system  was filled and also noted to be free of any filling defects or mass  effect.  I then cannulated the right ureteral orifice and performed an  identical procedure, noting also a normal ureter, as well as collecting  system.   I then advanced the ureteral 6-French open-end ureteral catheter up the  right ureter under direct fluoroscopy.  I then left this in place in  order to better guide my resection of the bladder tumors.  The  cystoscope was then removed.   The 26-French resectoscope sheath with Timberlake obturator was then  introduced into the bladder and the 12-degree lens with resectoscope  element inserted.  I resected both the  lateral and medial bladder  tumors.  They were small and superficial and the actual resection  appeared to possibly have caused thermal destruction of the lesion.  However, they were photographed previously for documentation purposes.  I then fulgurated the mucosa surrounding both of the resection sites and  at no time was I in close approximation to the ureteral orifice, which  was identified with the ureteral stent in place.  The pieces of bladder  tumor were then removed from the bladder and the bladder was drained.  The open-ended catheter was removed and then the resectoscope was  removed.  The patient was then awakened and taken to the recovery room  in stable and satisfactory condition.  He tolerated the procedure well  and there were no intraoperative complications.  I will give him a  prescription for Pyridium and he will follow up with me in the office  next week to discuss the results of his pathology report.      Mark C. Vernie Ammons, M.D.  Electronically Signed     MCO/MEDQ  D:  12/28/2008  T:  12/28/2008  Job:  [id number]

## 2010-10-14 NOTE — Op Note (Signed)
NAME:  [name] [name], [name] [name]                  ACCOUNT NO.:  [id number]   MEDICAL RECORD NO.:  [id number]          PATIENT TYPE:  AMB   LOCATION:  [facility]                         FACILITY:  [facility]   PHYSICIAN:  Mark C. Vernie Ammons, M.D.  DATE OF BIRTH:  1921-02-15   DATE OF PROCEDURE:  03/07/2007  DATE OF DISCHARGE:                               OPERATIVE REPORT   PREOPERATIVE DIAGNOSIS:  Recurrent left hydrocele.   POSTOPERATIVE DIAGNOSIS:  Recurrent left hydrocele.   OPERATION/PROCEDURE:  Left hydrocelectomy.   SURGEON:  Mark C. Vernie Ammons, M.D.   ANESTHESIA:  General.   ESTIMATED BLOOD LOSS:  Minimal.   DRAINS:  Quarter-inch inch Penrose drain in the left hemi-scrotum.   COMPLICATIONS:  None.   INDICATIONS:  The patient is an 77 year old white male who underwent a  left hydrocelectomy.  The skin who underwent a left hydrocelectomy.  No  unusual findings were noted at the time, and he did well  postoperatively, but the hydrocele recurred.  He is brought back to the  OR for repeat hydrocelectomy as it is symptomatic.  He desires treatment  for that reason.  I went over the risks and complications associated  with the procedure.  He understands and elected to proceed.   DESCRIPTION OF OPERATION:  After informed consent, the patient was major  OR, placed on the table, administered general anesthesia.  Genitalia was  sterilely prepped and draped.  An official time-out was performed.  I  then made a incision in the midline in the median raphe and carried this  down through the deep tissue to expose the hydrocele within the left  hemiscrotum.  I opened the pseudocapsule and drained several hundred  milliliters of dark amber fluid.  I then opened the pseudocapsules  further and noted the testicle to be normal in appearance.  I excised a  portion of the pseudocapsule and the remaining surface of the  pseudocapsule was then fulgurated with the flat side of the Bovie and  also partially denuded with the  use of with a Ray-Tec sponge.  I rubbed  the inner surface with the Ray-Tec vigorously and then fulgurated it.   I then located the most dependent portion of the left hemiscrotum, made  an incision in the scrotal skin and passed a hemostat from within the  left hemiscrotum and out through the scrotal skin incision, grasped the  Penrose drain and drew this back into the left hemiscrotum.  I then  secured it to the scrotal skin using a 3-0 chromic suture and a sterile  safety pin.   I then closed the left hemiscrotum first by closing the pseudo capsule  with a running 3-0 chromic suture and then closed this scrotal skin with  running 3-0 chromic as well.  Collodion was applied to the scrotal skin  incision.  A dry gauze dressing was applied to the area of the Penrose  and fluffed 4x4s and a scrotal support applied as well.  Prior to  closure of the skin, I injected 0.5% Marcaine plain Marcaine and the  patient was awakened and taken  to the recovery room in stable  satisfactory condition.  He  tolerated procedure well.  There were no  intraoperative complications.   I am going to maintain him on Keflex 500 mg b.i.d. I gave him 20 of  those and he will also receive 30 Vicodin HP.  He will follow-up in my  office in 2 weeks.  In 5 days, he will begin advancing the Penrose drain  by approximately 1/2 inch per day until it falls out.  Written  instructions regarding this and postop care were given to the patient.      Mark C. Vernie Ammons, M.D.  Electronically Signed     MCO/MEDQ  D:  03/07/2007  T:  03/07/2007  Job:  [id number]

## 2010-10-14 NOTE — Op Note (Signed)
NAME:  [name] [name], [name] [name]                  ACCOUNT NO.:  [id number]   MEDICAL RECORD NO.:  [id number]          PATIENT TYPE:  AMB   LOCATION:  [facility]                         FACILITY:  [facility]   PHYSICIAN:  Mark C. Vernie Ammons, M.D.  DATE OF BIRTH:  [birth year]   DATE OF PROCEDURE:  12/13/2006  DATE OF DISCHARGE:                               OPERATIVE REPORT   PREOPERATIVE DIAGNOSIS:  Symptomatic left hydrocele.   POSTOPERATIVE DIAGNOSIS:  Symptomatic left hydrocele.   PROCEDURE PERFORMED:  Hydrocelectomy.   SURGEON:  Ihor Gully, MD   ASSISTANT:  Shela Leff   ANESTHESIA:  General.   INDICATION FOR PROCEDURE:  [name] is an 77 year old male with a known  history of simple hydrocele.  This has enlarged to the point where it is  causing him discomfort, and he desires surgical management.   DESCRIPTION OF PROCEDURE:  The patient brought to the operating room.  He is identified by his arm band.  Informed consent is verified, and  preoperative time-out is performed.  After the successful induction of  general anesthesia, the patient's scrotum was shaved.  Sequential  compression devices were in play.   Perioperative antibiotics were administered.  The operative site was  prepped and draped in the usual manner.  A vertical incision was made in  the patient's median raphe.  Blunt and sharp dissection was used to  enter dartos.  We then carried out dissection toward the left  hemiscrotum.  The soft tissue overlying the hydrocele was dissected off  using fine Metzenbaums.  Once we had adequately dissected the tissue  overlying the hydrocele, hydrocele and testis were delivered.  The  hydrocele sac was entered sharply and drained.  Drainage was 225 mL.  We  then performed a subtotal excision of the hydrocele sac.  It was then  everted and closed in a bottle-neck repair.  Then used both cautery  reversibly to ensure excellent hemostasis.  Once our hemostasis was  excellent, we proceeded to  closure.  The testis was redelivered into the  left hemi-scrotum.  The dartos fascia was closed with a running 3-0  chromic.  The skin was closed with running 3-0 chromic.  At this time,  the procedure was terminated.  The patient tolerated the procedure well,  and there were no complications.  Dr. Ihor Gully was the attending  physician, was present for the entirety of the procedure.   DISPOSITION:  The patient was transported safely to the PACU.  He will  be discharged to home with Vicodin and Keflex.     ______________________________  Terie Purser, MD      Veverly Fells. Vernie Ammons, M.D.  Electronically Signed    JH/MEDQ  D:  12/13/2006  T:  12/13/2006  Job:  [id number]

## 2010-10-17 NOTE — Op Note (Signed)
NAME:  [name] [name], [name] [name]                  ACCOUNT NO.:  [id number]   MEDICAL RECORD NO.:  [id number]          PATIENT TYPE:  AMB   LOCATION:  [facility]                         FACILITY:  [facility]   PHYSICIAN:  Mark C. Vernie Ammons, M.D.  DATE OF BIRTH:  11-24-1920   DATE OF PROCEDURE:  05/11/2005  DATE OF DISCHARGE:                                 OPERATIVE REPORT   PREOPERATIVE DIAGNOSES:  1.  Right renal pelvis filling defect.  2.  Bladder tumor.   POSTOPERATIVE DIAGNOSES:  1.  Meatal stenosis.  2.  Right renal pelvis tumor.  3.  Bladder tumor approximately 6 cm in total aggregate diameter.   PROCEDURE:  Cystoscopy with urethral dilatation, right retrograde pyelogram  with interpretation, right ureteroscopy and biopsy of renal pelvis tumor  with laser coagulation of tumor bed and transurethral resection of bladder  tumor with double-J stent placement.   SURGEON:  Mark C. Vernie Ammons, M.D.   ANESTHESIA:  General.   SPECIMENS:  A portion of right renal pelvis tumor to pathology and portions  of bladder tumor to pathology.   BLOOD LOSS:  Minimal.   DRAINS:  18-French Foley catheter and a 6-French 26 cm double-J stent in the  right ureter with string.   COMPLICATIONS:  None.   INDICATIONS:  The patient is an 77 year old white male who had an episode of  hematuria. His urine cytology was unremarkable. However, CT scan showed a  filling defect in the right renal pelvis. Cystoscopy in my office revealed  an obvious bladder tumor on the left wall of the bladder as well as some  suspicious lesions on the right wall. He is brought to the OR today for  evaluation and treatment of the right renal pelvic lesion as well as  transurethral resection of his bladder tumors. The risks, complications and  alternatives were discussed. He understands and elected to proceed.   DESCRIPTION OF OPERATION:  After informed consent, the patient was brought  to the major OR, placed on the table, administered general  anesthesia and  then moved to the dorsal lithotomy position. A 22-French cystoscope was then  passed into the bladder under direct visualization. The urethra was noted to  be normal down to the sphincter which appears intact. The prostatic urethra  revealed trilobar hypertrophy but no lesions. The bladder was then  inspected. A tumor was noted on the left wall that was approximately 5 cm in  size and had one obvious papillary component and then the rest appeared to  be on the surface of the mucosa in a papillary configuration but not raised.  On the right wall, I also noted a papillary configuration of what appeared  to be early transitional cell carcinoma as well. Ureteral orifices normal  configuration and position. The bladder was inspected with both the 12 and  70 degree lenses and no additional lesions were identified.   A 6-French open-ended ureteral catheter was then placed in the right renal  orifice and a right retrograde pyelogram was performed by injecting contrast  under direct fluoroscopy. The ureter was noted to be  entirely normal  throughout its length. As the renal pelvis began to fill, I noted to filling  defect in the central portion of the renal pelvis is quite obvious. The  calyces appeared sharp and normal without any mass effect.   A 0.038 inch floppy-tip guidewire was then passed up the right ureter. I  then passed the access sheath up the right ureter and removed the obturator  as well as the guidewire. The flexible cystoscope was then passed through  the access sheath into the area of the renal pelvis. I was able to identify  the filling defect as a rounded tumor that appeared to have a central stalk.  I was able to get a nitinol basket around the lesion and was able to  essentially pluck it off the floor of the renal pelvis. I was unable to get  the tumor though to pull down through the ureter, it kept falling out of the  nitinol basket, so I removed the basket  and drained the renal pelvis through  the ureteroscope with a syringe and I was able to get some representative  bits of the lesion to send for pathology. I then used the 200 micron holmium  laser fiber to fulgurate the base of the tumor. I then replaced the  guidewire and backloaded the cystoscope and placed a double-J stent with  good curl being noted in the renal pelvis and bladder.   I then attempted to pass the 28-French resectoscope sheath but was unable to  negotiate it beyond the urethral meatus. I therefore dilated the distal  urethra with Sissy Hoff sounds to 30-French and was able to then easily  passed the 28-French resectoscope with Timberlake obturator into the  bladder. The tumor was identified and on the right wall first. I fulgurated  this, it was only a small area and I completely fulgurated the lesion. On  the right wall, the more suspicious lesion was then resected completely. I  then fulgurated the base and the surrounding mucosa. The lesion approximated  the left orifice but did not involve it, it posterior and superior to that.  I then removed the portions of tumor using the Ellik evacuator and an 22-  [place] Foley catheter was then placed. The string was left affixed to the  stent and the catheter connected to close system drainage.   The patient will be given some Pyridium plus and he will return to my office  to have his stent and catheter removed in 1 week. The portions of the  bladder tumor were sent for pathologic evaluation. He tolerated the  procedure well with no intraoperative complications.      Mark C. Vernie Ammons, M.D.  Electronically Signed     MCO/MEDQ  D:  05/11/2005  T:  05/12/2005  Job:  [id number]

## 2010-10-17 NOTE — Discharge Summary (Signed)
NAME:  [name] [name], [name] [name] NO.:  [id number]   MEDICAL RECORD NO.:  [id number]          PATIENT TYPE:  INP   LOCATION:  1407                         FACILITY:  [facility]   PHYSICIAN:  Lina Sar, M.D. [facility]  DATE OF BIRTH:  [birth year]   DATE OF ADMISSION:  10/19/2005  DATE OF DISCHARGE:  10/20/2005                                 DISCHARGE SUMMARY   ADMITTING DIAGNOSES:  1.  An 77 year old male with probable diverticular bleed.  2.  History of diverticulosis and colon polyps.  3.  Hypertension.  4.  Adult onset diabetes mellitus.  5.  Hyperlipidemia.  6.  History of bladder cancer.   DISCHARGE DIAGNOSES:  1.  Resolved diverticular bleed.  2.  Mild anemia secondary to above.  3.  History of diverticulosis and colon polyps.  4.  Hypertension.  5.  Adult onset diabetes mellitus.  6.  Hyperlipidemia.  7.  History of bladder cancer.   CONSULTATIONS:  None.   PROCEDURES:  None   BRIEF HISTORY:  [name] [name] was very nice 77 year old white male with history  of diverticular disease and colon polyps, also with bladder cancer status  post resection November 2006.  This was a papillary carcinoma.  He also has  hypertension, hyperlipidemia, and adult-onset diabetes mellitus.  The  patient has not had any prior history of bleeding other than a very minor  episode prior to colonoscopy about 5 years ago.  At this time he presents  with onset on the evening prior to admission with an urge for a bowel  movement followed by a stool mixed with a lot of bright red blood. On the  morning of admission, he had several similar episodes, though in smaller  amounts and describes the stool as being dark red. His last episode was  about 2 hours prior to admission and seemed to be a lesser volume.  He had  not had any associated abdominal pain, did have some vague nausea, no  diaphoresis, chest pain, shortness of breath, dizziness, etc..  He had  called our office, was advised to come to the  emergency room where he was  seen and admitted, noted to be hemodynamically stable. Hemoglobin was 13.5  on admission, and stool was grossly bloody.   LABORATORY STUDIES ON ADMISSION:  May 21: WBC 9.8, hemoglobin 13.5,  hematocrit of 39.7, MCV of 97, platelets 229.  Serial values were obtained  later on May 21. Hemoglobin was 12.4, hematocrit of 34.7, and May 22,  hemoglobin 12.3, hematocrit of 35.4, and a second value on May 22 was 12.5,  hematocrit of 36.2.  Pro time 12.7, INR 0.9. Sodium 129, potassium 5.5,  glucose 243, BUN 17, creatinine 0.9.  Liver function studies normal.   HOSPITAL COURSE:  The patient was admitted to the service of Dr. Lina Sar  who was covering on call.  He was placed at bedrest, started on clear liquid  diet, IV fluids, and serial hemoglobin and hematocrit.  Fortunately, he did  not manifest any further active bleeding after admission, and the following  morning his  hemoglobin had drifted but was still quite acceptable at 12.3.  He had no further stools after admission, and we opted to follow hemoglobin  later that same day which again was stable at 12.5, and allowed him on  discharge to home with instructions to stay off of aspirin for approximately  2 weeks, to follow a low-residue diet over the next week and then regular  thereafter. He was to follow up with Dr. Victorino Dike in the office on June 6  at 2:30 p.m. and to call for any problems with evidence of re-bleeding in  the interim.   CONDITION ON DISCHARGE:  Stable.      Mike Gip, P.A.-C. [facility]      Lina Sar, M.D. [facility]  Electronically Signed    AE/MEDQ  D:  11/04/2005  T:  11/04/2005  Job:  [id number]

## 2010-10-17 NOTE — Assessment & Plan Note (Signed)
[facility]                           PRIMARY CARE OFFICE NOTE   NAME:[name], [name] BUSK                         MRN:          [id number]  DATE:07/19/2006                            DOB:          1921-05-31    [name] [name] is an 77 year old soon to be 77 year old Caucasian gentleman  followed for multiple medical problems who presents for a followup and  evaluation. The patient was last seen in primary care January 13, 2005.  In the interval, he has continued to be followed by cardiology, Dr. Daphene Jaeger with last office note from June 02, 2006. The patient at that  time was stable and doing well. The patient is also followed by the GI  service and was last seen November 04, 2005 after having had a recent  hospitalization for a GI bleed based on a diverticular bleed May 21 to  Oct 20, 2005.   The patient continues to be followed by Dr. Vernie Ammons every 3 months for  bladder cancer treated December 2006 with laser coagulation. Also  followed for erectile dysfunction. The patient did have a cystoscopy  February 24, 2006 with the bladder having 1+ trabeculation and normal-  appearing mucosa. He is due for IVP and tomography in March 2008.   The patient has been followed by ophthalmology, Dr. Elmer Picker and has  undergone bilateral cataract extraction staged October 2007 and December  2007 with intraocular lens implants and has been doing well.   The patient reports he has low back pain but has managed to find a  method of relief using ice packs such as back pain and radiation of leg  pain is resolved.   PAST MEDICAL HISTORY:  SURGICAL:  1. CABG with LIMA to LAD,  SVG to OM, PD, RCA in 1994.  2. Cataract extraction staged October 2007 and December 2007.   MEDICAL:  1. The patient had the usual childhood diseases.  2. Hypertension.  3. Coronary artery disease with MI in 1981.  4. Non-insulin dependent diabetes.  5. Aortic aneurysm that has been stable.  6.  Diverticulosis with diverticular bleed.   CHART REVIEW:  1. Last upper endoscopy November 27, 2005 with gastritis that was chronic      with duodenitis without hemorrhage.  2. Last colonoscopy November 27, 2005 with colon polyps, diverticulosis      that is significant.  3. Last cardiac catheterization on our chart from March 18, 1993.  4. Last nuclear stress test August 06, 2004 with an estimated ejection      fraction of 55% evident of inferior and apical scar with no signs      of active ischemia.  5. Last abdominal ultrasound December 05, 1996 with a stable aortic      aneurysm 2.9 x 3.0 cm.  6. Last lower extremity arterial Doppler in 1997 showed normal ABIs      with no abnormalities noted.  7. Last hospitalization May 21 to Oct 20, 2005 for diverticular bleed.  8. Last CT abdomen with and without contrast from April 21, 2005  with gallstones, early aneurysmal change of the abdominal aorta      with maximal diameter of 3.2 cm. CT of the pelvis with prostatic      enlargement, bilateral inguinal hernias with left hydrocele.  9. Last lumbar spine film December 24, 2004 with scoliosis with associated      severe degenerative change.  10.Left knee film with no significant abnormality was noted. Right      knee film with mild degenerative changes was noted.  11.Last chest x-ray from November 08, 2001 with no evidence of acute      disease.   CURRENT MEDICATIONS:  1. Toprol XL 50 mg daily.  2. Vitamin C 1000 mg daily.  3. Amaryl 4 mg daily.  4. Lipitor 10 mg daily.  5. Altace 10 mg daily.  6. Metformin 500 mg b.i.d.  7. Enteric coated aspirin 81 mg daily.  8. Norvasc 5 mg daily.  9. Librium 10 mg p.r.n.   FAMILY HISTORY:  Noncontributory.   SOCIAL HISTORY:  The patient is widowed now I think 7 years. He does  have a significant other who has been seeing on a regular basis. The  patient goes to the gym on a daily basis, he is walking at least a mile  a day. 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   REVIEW OF SYSTEMS:  The patient has had no constitutional,  cardiovascular, respiratory, GI or GU complaints and reports he feels  well.   HABITS:  Tobacco none, alcohol averaging 1 ounce per day.   PHYSICAL EXAMINATION:  VITAL SIGNS:  Temperature was 97, blood pressure  129/57, pulse was 47, weight 167.  GENERAL:  This is a well-nourished gentleman looking younger than his  stated chronologic age in no acute distress.  HEENT:  Normocephalic, atraumatic. EACs and TMs were normal. Oropharynx  revealed the patient to have full dentures, no buccal lesions were  noted. The posterior pharynx was clear. Conjunctiva and sclera was  clear. Pupils equal, round and reactive to light and accommodation.  Funduscopic exam was deferred to Dr. Elmer Picker.  NECK:  Supple without thyromegaly.  NODES:  No adenopathy was noted in the cervical or supraclavicular  regions.  CHEST:  No CVA tenderness.  LUNGS:  Clear to auscultation and percussion.  CARDIOVASCULAR:  2+ radial pulses, no JVD or carotid bruits. He had a  quiet precordium with a regular rate and rhythm without murmurs, rubs or  gallops.  ABDOMEN:  Soft, no guarding, no rebound. No organosplenomegaly was  appreciated.  GENITALIA/RECTAL:  Deferred to GI and GU.  EXTREMITIES:  Without clubbing, cyanosis, edema or deformity.  NEUROLOGIC:  Nonfocal.   DATABASE:  From [facility] June 23, 2006,  hemoglobin 14.8 grams, white count was 7900 with a normal differential,  hemoglobin A1c 7.3%. Chemistries with a glucose of 162, creatinine was  0.9. Liver functions were normal. Cholesterol 141, triglycerides 138,  HDL was 50, LDL was 62.   ASSESSMENT/PLAN:  1. Diabetes. The patient's hemoglobin A1c is mildly elevated at 7.3%.      The patient reports his CBGs have been slightly elevated in the 140     range.  The patient is to increase his  metformin to 1000 mg q.a.m.,      500 mg in the p.m. He will have routine followup of his A1c.  2. Cardiovascular. The patient seems stable at this  time. He is      followed on a regular basis by Dr. Daphene Jaeger. The patient's blood      pressure has been well controlled and remains so.  3. Lipids. The patient's last lipid panel was excellent at goal. He      will continue on his present regimen.  4. GU. The patient is followed on a regular basis by Dr.  Vernie Ammons. He      has been stable and doing well.  5. Peripheral vascular disease. The patient has a stable aortic      aneurysm that has been followed on a regular basis.   HEALTH MAINTENANCE:  The patient's current and up-to-date with  colorectal cancer screening. He had a pneumonia vaccine in 1999.   In summary, he is a very delightful gentleman who is medically stable at  this time. I have asked him to return to see me on an as needed basis.     Rosalyn Gess Norins, MD  Electronically Signed    MEN/MedQ  DD: 07/20/2006  DT: 07/20/2006  Job #: [id number]   cc:   Nicki Guadalajara, M.D.  Veverly Fells. Vernie Ammons, M.D.  Mayer Masker

## 2010-10-17 NOTE — H&P (Signed)
NAME:  [name] [name], [name] [name] NO.:  [id number]   MEDICAL RECORD NO.:  [id number]          PATIENT TYPE:  INP   LOCATION:  1407                         FACILITY:  [facility]   PHYSICIAN:  Lina Sar, M.D. [facility]  DATE OF BIRTH:  [birth year]   DATE OF ADMISSION:  10/19/2005  DATE OF DISCHARGE:                                HISTORY & PHYSICAL   CHIEF COMPLAINT:  Rectal bleeding.   HISTORY:  [name] [name] is a very nice 77 year old white male with history of  diverticulosis and colon polyps.  He has also had a bladder CA which was  removed in November 2006.  This was a papillary carcinoma, he has  hypertension, hyperlipidemia and adult-onset diabetes mellitus.   The patient has not had any prior history of bleeding other than a minor  episode prior to his colonoscopy 5 years ago.  He has now had onset last  evening with an urge for a bowel movement which was followed by a stool with  lots of bright red blood in the commode.  This morning he has had several  similar episodes, though smaller amounts.  He says these all had dark red  blood.  His last episode was approximately 2 hours ago and again decreasing  in amount.  He has not had any associated abdominal pain, has had some vague  nausea, no diaphoresis, chest pain, shortness of breath, dizziness, etc.  He  called our office and was advised to come to the emergency room where was  seen, evaluated and admitted.  He is currently hemodynamically stable.  Labs  show a WBC of 9.8, hemoglobin 13.5, hematocrit of 39.7, sodium 129,  potassium of 5, glucose 243, LFTs within normal limits and albumin of 4.2.  His last colonoscopy was done in June 2002 and at that time noted to have  moderate left-sided diverticulosis from the transverse to the sigmoid colon  and no polyps.  At this time he is admitted for observation with probable  diverticular hemorrhage.   CURRENT MEDICATIONS:  1.  Toprol XL 50 daily.  2.  Aspirin 325 daily.  3.  Lipitor  10 daily.  4.  Altace 10 daily.  5.  Metformin 500 b.i.d.  6.  Amaryl 4 mg daily.   ALLERGIES:  NO KNOWN DRUG ALLERGIES.   PAST HISTORY:  As outlined above.   SOCIAL HISTORY:  The patient lives alone.  His daughter is supportive and  accompanies him today.  He is generally active, golfs, walks 1-2 miles  daily.  No ETOH and no tobacco.   FAMILY HISTORY:  Family history is negative for colon cancer or polyps.   REVIEW OF SYSTEMS:  CARDIOVASCULAR:  Denies any chest pain or anginal  symptoms.  He just had a Cardiolite and has a followup with Dr. Tresa Endo next  week.  He says he generally walks 1-2 miles daily.  PULMONARY:  Negative.  GU:  Negative.  Follows with Dr. Vernie Ammons on a regular basis.  NEURO:  He  says he has had an unsteady gait the past year or two which  he attributes to  age.   PHYSICAL EXAMINATION:  GENERAL:  Well-developed elderly white male in no  acute distress.  VITAL SIGNS:  Temperature is 97.3, blood pressure 151/68, pulse of 60,  respirations 16.  HEENT: Nontraumatic, normocephalic.  EOMI.  PERLA.  Sclerae anicteric.  CARDIOVASCULAR:  Regular rhythm with S1 and S2.  PULMONARY:  Clear to A&P.  ABDOMEN:  Abdomen is soft and bowel sounds are active.  He is nontender.  There is no palpable mass or hepatosplenomegaly.  RECTAL:  Dark red stool in the rectal vault.  There is no mass.  EXTREMITIES:  Extremities without clubbing, cyanosis or edema.  NEURO:  Grossly nonfocal.   IMPRESSION:  1.  An 77 year old white male with probable diverticular hemorrhage      hemodynamically stable.  2.  History of diverticulosis and colon polyps.  3.  Hypertension.  4.  Adult-onset diabetes mellitus.  5.  Hyperlipidemia.  6.  History of bladder cancer.   PLAN:  1.  The patient is admitted to the service of Dr. Lina Sar for IV fluid      hydration, bowel rest, serial H&H, he will be transfused as indicated.      We will consider follow-up colonoscopy but not necessarily during  this      admission.  2.  Hold aspirin.  3.  Hold Amaryl and metformin for the time being.      Mike Gip, P.A.-C. [facility]      Lina Sar, M.D. [facility]  Electronically Signed    AE/MEDQ  D:  10/19/2005  T:  10/19/2005  Job:  [id number]

## 2010-10-31 ENCOUNTER — Telehealth: Payer: Self-pay | Admitting: *Deleted

## 2010-10-31 NOTE — Telephone Encounter (Signed)
Left mess to call office back Monday

## 2010-10-31 NOTE — Telephone Encounter (Signed)
Is the daughter calling for a refill or because she has concerns about this medication. If it is for refill - OK x 5. For concerns - may need OV with the patient.  Patient was to call back with a weight check after his last visit - how is his weight: up , down or the same?

## 2010-10-31 NOTE — Telephone Encounter (Signed)
Daughter is req a call back regarding her father's RX for librium.

## 2010-11-04 ENCOUNTER — Other Ambulatory Visit: Payer: Self-pay | Admitting: Internal Medicine

## 2010-11-13 DIAGNOSIS — I739 Peripheral vascular disease, unspecified: Secondary | ICD-10-CM

## 2010-11-13 HISTORY — DX: Peripheral vascular disease, unspecified: I73.9

## 2010-12-15 ENCOUNTER — Other Ambulatory Visit: Payer: Self-pay | Admitting: *Deleted

## 2010-12-15 MED ORDER — CHLORDIAZEPOXIDE HCL 10 MG PO CAPS: 10.0000 mg | ORAL_CAPSULE | Freq: Two times a day (BID) | ORAL | Status: DC | PRN

## 2011-02-19 ENCOUNTER — Telehealth: Payer: Self-pay | Admitting: *Deleted

## 2011-02-19 NOTE — Telephone Encounter (Signed)
I reviewed labs with Dr Yetta Barre. Pt needs appointment with Dr Debby Bud next week

## 2011-02-19 NOTE — Telephone Encounter (Signed)
Daughter called, Pt had labs at Dr [facility] office (cardiologist). They said results would be faxed over to Dr Debby Bud. Per Daughter HGB was 9.6 and A1c 7.1. She is req advisement and to know if labs have been received.

## 2011-02-19 NOTE — Telephone Encounter (Signed)
Daughter called, Pt had labs at Dr [facility] office (cardiologist). They said results would be faxed over. Per Daughter HGB was 9.6 and A1c 7.1. She is req advisement and to know if labs have been received.

## 2011-02-23 ENCOUNTER — Ambulatory Visit (INDEPENDENT_AMBULATORY_CARE_PROVIDER_SITE_OTHER): Payer: Medicare Other | Admitting: Internal Medicine

## 2011-02-23 VITALS — BP 138/74 | HR 63 | Temp 97.6°F | Wt 149.0 lb

## 2011-02-23 DIAGNOSIS — G479 Sleep disorder, unspecified: Secondary | ICD-10-CM

## 2011-02-23 DIAGNOSIS — I10 Essential (primary) hypertension: Secondary | ICD-10-CM

## 2011-02-23 DIAGNOSIS — R413 Other amnesia: Secondary | ICD-10-CM

## 2011-02-23 DIAGNOSIS — E785 Hyperlipidemia, unspecified: Secondary | ICD-10-CM

## 2011-02-23 DIAGNOSIS — R279 Unspecified lack of coordination: Secondary | ICD-10-CM

## 2011-02-23 DIAGNOSIS — E119 Type 2 diabetes mellitus without complications: Secondary | ICD-10-CM

## 2011-02-23 DIAGNOSIS — D649 Anemia, unspecified: Secondary | ICD-10-CM | POA: Insufficient documentation

## 2011-02-23 MED ORDER — MEMANTINE HCL 28 X 5 MG & 21 X 10 MG PO TABS: ORAL_TABLET | ORAL | Status: DC

## 2011-02-23 NOTE — Patient Instructions (Signed)
1. Anemia with a Hemoglobin of 9.6 from a baseline of 14.6 last November.

## 2011-02-23 NOTE — Progress Notes (Signed)
  Subjective:    Patient ID: [name] [name], male    DOB: 1921/04/20, 77 y.o.   MRN: [id number]  HPI [name] [name] presents with his daughter and son. He has been having more trouble with falls and balance despite the use of his walker. He has not had loss of consciousness.   The children are concerned about some memory loss and when confronted on this he admits to some memory loss which he feels is minor.   He has a disrupted sleep habit: sleeps poorly at night and then will take long naps in the day to the point of missing meals.   Patient had lab work at [facility] and was found to have anemia w/ Hgb 9.6 down from his baseline of 14. He has had no signs of bleeding. He has no acute symptoms  Past Medical History  Diagnosis Date  . CVA (cerebral infarction) 1981    MI  . Type II or unspecified type diabetes mellitus without mention of complication, not stated as uncontrolled   . Diverticulosis   . History of GI diverticular bleed 2007  . HTN (hypertension)   . AAA (abdominal aortic aneurysm)     CT 2006 3.2 cm  . Bladder cancer     Transitional cell carcinoma excised 2006  . Renal malignant tumor     transitional cell carcinoma lasered 2006  . Hyperlipidemia   . Hydrocele, left   . Inguinal hernia    Past Surgical History  Procedure Date  . Coronary artery bypass graft '94    LIMA-LAD, SVG-OM, PD, RCA  . Cataract extraction 2007  . Renal laser ablation of tumor 2006   No family history on file. History   Social History  . Marital Status: Single    Spouse Name: N/A    Number of Children: N/A  . Years of Education: N/A   Occupational History  . Retired    Social History Main Topics  . Smoking status: Not on file  . Smokeless tobacco: Not on file  . Alcohol Use: Not on file  . Drug Use: Not on file  . Sexually Active: Not on file   Other Topics Concern  . 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  Regular Exercise -   YES.  END OF LIFE: DNR, DNI unless reversible illness; no heroic measures to artificially sustain life.        Review of Systems System review is negative for any constitutional, cardiac, pulmonary, GI or neuro symptoms or complaints     Objective:   Physical Exam Vitals reviewed - stable BP Gen'l - elderly white male who appears a bit frail Pulm - normal respirations Cor - RRR Neuro - good clock face exercise, oriented to day/date/year, reasonably conversant with current events, 2/3 word recall       Assessment & Plan:  o report of

## 2011-02-25 ENCOUNTER — Other Ambulatory Visit (INDEPENDENT_AMBULATORY_CARE_PROVIDER_SITE_OTHER): Payer: Medicare Other

## 2011-02-25 DIAGNOSIS — R413 Other amnesia: Secondary | ICD-10-CM | POA: Insufficient documentation

## 2011-02-25 DIAGNOSIS — G479 Sleep disorder, unspecified: Secondary | ICD-10-CM | POA: Insufficient documentation

## 2011-02-25 DIAGNOSIS — D649 Anemia, unspecified: Secondary | ICD-10-CM

## 2011-02-25 LAB — CBC WITH DIFFERENTIAL/PLATELET
Basophils Absolute: 0 10*3/uL (ref 0.0–0.1)
Eosinophils Absolute: 0.2 10*3/uL (ref 0.0–0.7)
Hemoglobin: 9.8 g/dL — ABNORMAL LOW (ref 13.0–17.0)
Lymphocytes Relative: 18.1 % (ref 12.0–46.0)
MCHC: 31.9 g/dL (ref 30.0–36.0)
Monocytes Relative: 5.7 % (ref 3.0–12.0)
Neutro Abs: 5.2 10*3/uL (ref 1.4–7.7)
Platelets: 289 10*3/uL (ref 150.0–400.0)
RDW: 19.6 % — ABNORMAL HIGH (ref 11.5–14.6)

## 2011-02-25 LAB — IBC PANEL
Iron: 25 ug/dL — ABNORMAL LOW (ref 42–165)
Saturation Ratios: 7.2 % — ABNORMAL LOW (ref 20.0–50.0)
Transferrin: 248.6 mg/dL (ref 212.0–360.0)

## 2011-02-25 NOTE — Assessment & Plan Note (Signed)
On metformin. Had outside lab which revealed reasonable control. He has not had any hypoglycemic episodes.  Plan - continue present meds

## 2011-02-25 NOTE — Assessment & Plan Note (Signed)
Patient with indications of memory loss. Discussed options with patient and his children.  Plan - Namenda - titration pak. If tolerated routine full dose treatment

## 2011-02-25 NOTE — Assessment & Plan Note (Signed)
Controlled per outside labs (sent to be scanned)

## 2011-02-25 NOTE — Assessment & Plan Note (Signed)
BP Readings from Last 3 Encounters:  02/23/11 138/74  10/02/10 122/60  05/20/10 128/60   Adquate control on present medications

## 2011-02-25 NOTE — Assessment & Plan Note (Signed)
Continuing problem. Had MRI brain and MRA head in Nov '11 with no sign of infarct or injury of vascular disease. He has had several falls. He does report increased dizziness and balance problems with position change.  Plan - continued use of rolling walker           "Rule of 20"- with position change

## 2011-02-25 NOTE — Assessment & Plan Note (Signed)
Reviewed sleep hygiene in detail: regular hours to rise and retire; no stimulants; sleep sanctuary; avoid extinction behaviors and no napping. No Rx medications but he may use benadryl 25 mg if he needs a sleep aid.

## 2011-02-25 NOTE — Assessment & Plan Note (Signed)
Patient with new onset anemia with Hgb 9.6 with no signs or symptoms of a bleed. He has not had increased shortness of breath or chest pain.  Plan - lab evaluation: repeat CBC, IBC panel, retic count

## 2011-02-26 ENCOUNTER — Other Ambulatory Visit: Payer: Self-pay | Admitting: *Deleted

## 2011-02-26 MED ORDER — ESOMEPRAZOLE MAGNESIUM 40 MG PO CPDR: 40.0000 mg | DELAYED_RELEASE_CAPSULE | Freq: Every day | ORAL | Status: DC

## 2011-03-03 ENCOUNTER — Telehealth: Payer: Self-pay | Admitting: *Deleted

## 2011-03-03 NOTE — Telephone Encounter (Signed)
Pt's son calling reg his low hemoglobin.

## 2011-03-04 NOTE — Telephone Encounter (Signed)
Marked iron deficiency.  Hgb stable at 9.6. Plan - Iron 325mg  bid Retic count not in EMR - check with the lab

## 2011-03-05 NOTE — Telephone Encounter (Signed)
Pt and his son informed

## 2011-03-12 LAB — POCT I-STAT 4, (NA,K, GLUC, HGB,HCT)
Glucose, Bld: 162 — ABNORMAL HIGH
HCT: 43
Hemoglobin: 14.6
Operator id: 268271
Potassium: 4.2
Sodium: 135

## 2011-03-17 LAB — I-STAT 8, (EC8 V) (CONVERTED LAB)
Acid-Base Excess: 1
BUN: 17
Bicarbonate: 26.5 — ABNORMAL HIGH
Chloride: 101
Glucose, Bld: 185 — ABNORMAL HIGH
HCT: 45
Hemoglobin: 15.3
Operator id: 268271
Potassium: 4.3
Sodium: 136
TCO2: 28
pCO2, Ven: 42.7 — ABNORMAL LOW
pH, Ven: 7.4 — ABNORMAL HIGH

## 2011-03-19 ENCOUNTER — Other Ambulatory Visit (INDEPENDENT_AMBULATORY_CARE_PROVIDER_SITE_OTHER): Payer: Medicare Other

## 2011-03-19 ENCOUNTER — Ambulatory Visit (INDEPENDENT_AMBULATORY_CARE_PROVIDER_SITE_OTHER): Payer: Medicare Other | Admitting: Endocrinology

## 2011-03-19 ENCOUNTER — Encounter: Payer: Self-pay | Admitting: Endocrinology

## 2011-03-19 DIAGNOSIS — D649 Anemia, unspecified: Secondary | ICD-10-CM

## 2011-03-19 DIAGNOSIS — E871 Hypo-osmolality and hyponatremia: Secondary | ICD-10-CM

## 2011-03-19 DIAGNOSIS — E119 Type 2 diabetes mellitus without complications: Secondary | ICD-10-CM

## 2011-03-19 DIAGNOSIS — Z79899 Other long term (current) drug therapy: Secondary | ICD-10-CM

## 2011-03-19 DIAGNOSIS — D72829 Elevated white blood cell count, unspecified: Secondary | ICD-10-CM

## 2011-03-19 DIAGNOSIS — M109 Gout, unspecified: Secondary | ICD-10-CM

## 2011-03-19 DIAGNOSIS — E538 Deficiency of other specified B group vitamins: Secondary | ICD-10-CM

## 2011-03-19 LAB — CBC WITH DIFFERENTIAL/PLATELET
Basophils Relative: 0 % (ref 0.0–3.0)
Eosinophils Relative: 0 % (ref 0.0–5.0)
Lymphocytes Relative: 4.7 % — ABNORMAL LOW (ref 12.0–46.0)
Monocytes Absolute: 0.6 10*3/uL (ref 0.1–1.0)
Monocytes Relative: 4 % (ref 3.0–12.0)
Neutrophils Relative %: 91.3 % — ABNORMAL HIGH (ref 43.0–77.0)
Platelets: 223 10*3/uL (ref 150.0–400.0)
RBC: 3.82 Mil/uL — ABNORMAL LOW (ref 4.22–5.81)
WBC: 15.6 10*3/uL — ABNORMAL HIGH (ref 4.5–10.5)

## 2011-03-19 LAB — IBC PANEL
Iron: 7 ug/dL — ABNORMAL LOW (ref 42–165)
Saturation Ratios: 2.1 % — ABNORMAL LOW (ref 20.0–50.0)
Transferrin: 237.4 mg/dL (ref 212.0–360.0)

## 2011-03-19 LAB — HEMOGLOBIN A1C: Hgb A1c MFr Bld: 6.6 % — ABNORMAL HIGH (ref 4.6–6.5)

## 2011-03-19 LAB — BASIC METABOLIC PANEL
Calcium: 8.6 mg/dL (ref 8.4–10.5)
Creatinine, Ser: 1.2 mg/dL (ref 0.4–1.5)
GFR: 62.46 mL/min (ref >60.00)
Sodium: 127 mEq/L — ABNORMAL LOW (ref 135–145)

## 2011-03-19 LAB — VITAMIN B12: Vitamin B-12: 149 pg/mL — ABNORMAL LOW (ref 211–911)

## 2011-03-19 LAB — TSH: TSH: 0.97 u[IU]/mL (ref 0.35–5.50)

## 2011-03-19 NOTE — Progress Notes (Signed)
Subjective:    Patient ID: [name] [name], male    DOB: 1921/02/02, 77 y.o.   MRN: [id number]  HPI [facility]  He was recently started on namenda.  He is here with dtr (connie post).  She says pt has few weeks of hallucinations, going to get meds in the middle of the night, and worsening of memory loss.  Last night, he was found sitting of the floor.  He denies fall.  No apparent injury.  She says cbg was checked, and it was normal (she does not know actual number).  dtr says pt has been ambulating with walker this am. Past Medical History  Diagnosis Date  . CVA (cerebral infarction) 1981    MI  . Type II or unspecified type diabetes mellitus without mention of complication, not stated as uncontrolled   . Diverticulosis   . History of GI diverticular bleed 2007  . HTN (hypertension)   . AAA (abdominal aortic aneurysm)     CT 2006 3.2 cm  . Bladder cancer     Transitional cell carcinoma excised 2006  . Renal malignant tumor     transitional cell carcinoma lasered 2006  . Hyperlipidemia   . Hydrocele, left   . Inguinal hernia     Past Surgical History  Procedure Date  . Coronary artery bypass graft '94    LIMA-LAD, SVG-OM, PD, RCA  . Cataract extraction 2007  . Renal laser ablation of tumor 2006    History   Social History  . Marital Status: Single    Spouse Name: N/A    Number of Children: N/A  . Years of Education: N/A   Occupational History  . Retired    Social History Main Topics  . Smoking status: Former Games developer  . Smokeless tobacco: Not on file  . Alcohol Use: Not on file  . Drug Use: Not on file  . Sexually Active: Not on file   Other Topics Concern  . 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  Regular Exercise -  YES.  END OF LIFE: DNR, DNI unless reversible illness; no heroic measures to artificially sustain life.     Current Outpatient Prescriptions on File Prior to Visit  Medication  Sig Dispense Refill  . amiodarone (PACERONE) 300 MG tablet Take 300 mg by mouth daily.        Marland Kitchen amLODipine (NORVASC) 5 MG tablet Take 5 mg by mouth daily.        Marland Kitchen aspirin 81 MG EC tablet Take 81 mg by mouth daily.        Marland Kitchen atorvastatin (LIPITOR) 10 MG tablet Take 10 mg by mouth daily. 3 times a week      . chlordiazePOXIDE (LIBRIUM) 10 MG capsule Take 1 capsule (10 mg total) by mouth every 12 (twelve) hours as needed.  50 capsule  5  . esomeprazole (NEXIUM) 40 MG capsule Take 1 capsule (40 mg total) by mouth daily.  30 capsule  11  . insulin detemir (LEVEMIR FLEXPEN) 100 UNIT/ML injection Inject 5 Units into the skin at bedtime.       . Insulin Pen Needle (NOVOFINE) 32G X 6 MM MISC as directed.        . metFORMIN (GLUCOPHAGE) 1000 MG tablet take 1 tablet by mouth twice a day with food  60 tablet  4  . metoprolol (TOPROL-XL) 50 MG 24 hr tablet Take 50  mg by mouth daily.        Marland Kitchen warfarin (COUMADIN) 1 MG tablet Take 1 mg by mouth as directed.          No Known Allergies  No family history on file.  BP 120/60  Pulse 96  Temp(Src) 97.2 F (36.2 C) (Oral)  Wt 140 lb 12 oz (63.844 kg)  SpO2 97%    Review of Systems Denies loc, headaches, and n/v    Objective:   Physical Exam Vital signs: see vs page Gen: elderly, frail, no distress.  In wheelchair head: no deformity.  No trauma eyes: no periorbital swelling, no proptosis external nose and ears are normal mouth: no lesion seen Tm's: no blood LUNGS:  Clear to auscultation HEART:  Regular rate and rhythm without murmurs noted. Normal S1,S2.   Motor: strength 4/5 diffusely PSYCH: Alert and oriented to self, 2013, and dr norins'office).  Does not appear anxious nor depressed.  Lab Results  Component Value Date   WBC 15.6* 03/19/2011   HGB 9.9* 03/19/2011   HCT 30.5* 03/19/2011   PLT 223.0 03/19/2011   GLUCOSE 202* 03/19/2011   NA 127* 03/19/2011   K 4.1 03/19/2011   CL 88* 03/19/2011   CREATININE 1.2 03/19/2011   BUN 20  03/19/2011   CO2 27 03/19/2011   TSH 0.97 03/19/2011   HGBA1C 6.6* 03/19/2011      Assessment & Plan:  Hyponatremia, slightly worse fe-deficiency anemia, needs increased rx b-12 deficiency, new Dm, overcontrolled for this insulin. Dementia, slightly worse.  There are several possible reasons for this. Leukocytosis.  Could be due to the stress of the episode yesterday, or could mean occult infection.

## 2011-03-19 NOTE — Patient Instructions (Addendum)
Stop namenda for now blood tests are being requested for you today.  please call [phone] to hear your test results.  You will be prompted to enter the 9-digit "MRN" number that appears at the top left of this page, followed by #.  Then you will hear the message. Please come back for a follow-up appointment with dr Debby Bud in 2 weeks. it is critically important to prevent falling down (keep floor areas well-lit, dry, and free of loose objects.  If you have a cane, walker, or wheelchair, you should use it, even for short trips around the house.  Also, try not to rush.) (update: i left message on phone-tree thu evening:   Reduce levemir to 5 units sq qam.   Start b-12 1000 mcg im q month.   Skip 1 day of altace, then resume at 2.5 mg po daily.  Check ua 288.60.  Please send specimen to our lab, or result to [place]. Start iron sulfate 325 mg po bid.   We'll advise [facility] of these med changes).

## 2011-03-20 DIAGNOSIS — E538 Deficiency of other specified B group vitamins: Secondary | ICD-10-CM | POA: Insufficient documentation

## 2011-03-20 DIAGNOSIS — D72829 Elevated white blood cell count, unspecified: Secondary | ICD-10-CM | POA: Insufficient documentation

## 2011-03-24 ENCOUNTER — Telehealth: Payer: Self-pay

## 2011-03-24 ENCOUNTER — Ambulatory Visit (INDEPENDENT_AMBULATORY_CARE_PROVIDER_SITE_OTHER): Payer: Medicare Other | Admitting: Internal Medicine

## 2011-03-24 VITALS — BP 142/70 | HR 72 | Temp 97.8°F | Wt 139.0 lb

## 2011-03-24 DIAGNOSIS — C4491 Basal cell carcinoma of skin, unspecified: Secondary | ICD-10-CM

## 2011-03-24 DIAGNOSIS — F29 Unspecified psychosis not due to a substance or known physiological condition: Secondary | ICD-10-CM

## 2011-03-24 DIAGNOSIS — N390 Urinary tract infection, site not specified: Secondary | ICD-10-CM

## 2011-03-24 DIAGNOSIS — R410 Disorientation, unspecified: Secondary | ICD-10-CM

## 2011-03-24 MED ORDER — SULFAMETHOXAZOLE-TRIMETHOPRIM 800-160 MG PO TABS: 1.0000 | ORAL_TABLET | Freq: Two times a day (BID) | ORAL | Status: AC

## 2011-03-24 NOTE — Progress Notes (Signed)
  Subjective:    Patient ID: [name] [name], male    DOB: 18-Dec-1920, 77 y.o.   MRN: [id number]  HPI [name] [name] was seen by Dr. Sherrilyn Rist on Oct 18th after an episode of confusion. Exam was unremarkabe. Lab revealed persistent anemia with Hgb 9.9, leukocytosis at 16.6. [facility] that was by report positive. He was taken off namenda and his sensorium has returned to normal.   He does have a skin lesion on the right vertex scalp.   Past Medical History  Diagnosis Date  . CVA (cerebral infarction) 1981    MI  . Type II or unspecified type diabetes mellitus without mention of complication, not stated as uncontrolled   . Diverticulosis   . History of GI diverticular bleed 2007  . HTN (hypertension)   . AAA (abdominal aortic aneurysm)     CT 2006 3.2 cm  . Bladder cancer     Transitional cell carcinoma excised 2006  . Renal malignant tumor     transitional cell carcinoma lasered 2006  . Hyperlipidemia   . Hydrocele, left   . Inguinal hernia    Past Surgical History  Procedure Date  . Coronary artery bypass graft '94    LIMA-LAD, SVG-OM, PD, RCA  . Cataract extraction 2007  . Renal laser ablation of tumor 2006   No family history on file. History   Social History  . Marital Status: Single    Spouse Name: N/A    Number of Children: N/A  . Years of Education: N/A   Occupational History  . Retired    Social History Main Topics  . Smoking status: Former Games developer  . Smokeless tobacco: Not on file  . Alcohol Use: Not on file  . Drug Use: Not on file  . Sexually Active: Not on file   Other Topics Concern  . 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  Regular Exercise -  YES.  END OF LIFE: DNR, DNI unless reversible illness; no heroic measures to artificially sustain life.        Review of Systems System review is negative for any constitutional, cardiac, pulmonary, GI or neuro symptoms or complaints other than  as described in the HPI.     Objective:   Physical Exam Vitals reviewed - stable Gen'l - elderly, thin white male who appears chronically ill HEENT - [place]/AT, C&S clear Pulm - normal respirations Cor - 2+ radial , RRR Derm - 2x1 cm lesion with eroded center and rolled edge at the right vertex scalp Neuro - A&O person, place. CN II-XII normal facial symmetry, PERRLA, EOMI, Gait - unsteady, requires use of rolling walker       Assessment & Plan:  1. Confusion - this cleared with cessation of Namenda.  2. UTI - U/A and culture faxed from [facility] - unusual organism that is pan sensative.  Plan - Septra DS bid x 7  3. DMV license application - will complete but discouraged patient from driving.

## 2011-03-24 NOTE — Telephone Encounter (Signed)
FYI.Marland KitchenMarland KitchenRosanna w [name] called triage [facility] stating that they will fax results of U/A and culture. Patient has UTI and will need antibiotic

## 2011-03-25 NOTE — Telephone Encounter (Signed)
Results received, faxed over order for cipro 250 bid x 7 days

## 2011-03-26 ENCOUNTER — Encounter: Payer: Self-pay | Admitting: Internal Medicine

## 2011-04-02 ENCOUNTER — Ambulatory Visit: Payer: Medicare Other | Admitting: Internal Medicine

## 2011-04-09 ENCOUNTER — Encounter (HOSPITAL_COMMUNITY): Payer: Self-pay | Admitting: Emergency Medicine

## 2011-04-09 ENCOUNTER — Emergency Department (HOSPITAL_COMMUNITY)
Admission: EM | Admit: 2011-04-09 | Discharge: 2011-04-09 | Disposition: A | Discharge status: Home / Self Care | Payer: Medicare Other | Attending: Emergency Medicine | Admitting: Emergency Medicine

## 2011-04-09 ENCOUNTER — Emergency Department (HOSPITAL_COMMUNITY): Payer: Medicare Other

## 2011-04-09 DIAGNOSIS — R4182 Altered mental status, unspecified: Secondary | ICD-10-CM | POA: Insufficient documentation

## 2011-04-09 DIAGNOSIS — R404 Transient alteration of awareness: Secondary | ICD-10-CM | POA: Insufficient documentation

## 2011-04-09 DIAGNOSIS — Z8659 Personal history of other mental and behavioral disorders: Secondary | ICD-10-CM

## 2011-04-09 DIAGNOSIS — Z8551 Personal history of malignant neoplasm of bladder: Secondary | ICD-10-CM | POA: Insufficient documentation

## 2011-04-09 DIAGNOSIS — E119 Type 2 diabetes mellitus without complications: Secondary | ICD-10-CM | POA: Insufficient documentation

## 2011-04-09 DIAGNOSIS — K625 Hemorrhage of anus and rectum: Secondary | ICD-10-CM

## 2011-04-09 DIAGNOSIS — F0390 Unspecified dementia without behavioral disturbance: Secondary | ICD-10-CM

## 2011-04-09 DIAGNOSIS — F29 Unspecified psychosis not due to a substance or known physiological condition: Secondary | ICD-10-CM | POA: Insufficient documentation

## 2011-04-09 DIAGNOSIS — I80299 Phlebitis and thrombophlebitis of other deep vessels of unspecified lower extremity: Secondary | ICD-10-CM

## 2011-04-09 DIAGNOSIS — Z951 Presence of aortocoronary bypass graft: Secondary | ICD-10-CM | POA: Insufficient documentation

## 2011-04-09 DIAGNOSIS — I10 Essential (primary) hypertension: Secondary | ICD-10-CM | POA: Insufficient documentation

## 2011-04-09 DIAGNOSIS — R634 Abnormal weight loss: Secondary | ICD-10-CM

## 2011-04-09 DIAGNOSIS — E538 Deficiency of other specified B group vitamins: Secondary | ICD-10-CM

## 2011-04-09 HISTORY — DX: Unspecified dementia, unspecified severity, without behavioral disturbance, psychotic disturbance, mood disturbance, and anxiety: F03.90

## 2011-04-09 HISTORY — DX: Hemorrhage of anus and rectum: K62.5

## 2011-04-09 HISTORY — DX: Abnormal weight loss: R63.4

## 2011-04-09 HISTORY — DX: Deficiency of other specified B group vitamins: E53.8

## 2011-04-09 HISTORY — DX: Phlebitis and thrombophlebitis of other deep vessels of unspecified lower extremity: I80.299

## 2011-04-09 LAB — CBC
MCH: 25.9 pg — ABNORMAL LOW (ref 26.0–34.0)
MCHC: 31.9 g/dL (ref 30.0–36.0)
MCV: 81 fL (ref 78.0–100.0)
Platelets: 379 10*3/uL (ref 150–400)
RBC: 3.52 MIL/uL — ABNORMAL LOW (ref 4.22–5.81)
RDW: 21.5 % — ABNORMAL HIGH (ref 11.5–15.5)

## 2011-04-09 LAB — COMPREHENSIVE METABOLIC PANEL
AST: 20 U/L (ref 0–37)
CO2: 30 mEq/L (ref 19–32)
Calcium: 9.3 mg/dL (ref 8.4–10.5)
Creatinine, Ser: 1.27 mg/dL (ref 0.50–1.35)
GFR calc Af Amer: 56 mL/min — ABNORMAL LOW (ref >90)
GFR calc non Af Amer: 49 mL/min — ABNORMAL LOW (ref >90)
Glucose, Bld: 199 mg/dL — ABNORMAL HIGH (ref 70–99)
Sodium: 129 mEq/L — ABNORMAL LOW (ref 135–145)
Total Protein: 6.5 g/dL (ref 6.0–8.3)

## 2011-04-09 LAB — DIFFERENTIAL
Eosinophils Absolute: 0 10*3/uL (ref 0.0–0.7)
Eosinophils Relative: 0 % (ref 0–5)
Lymphs Abs: 1.3 10*3/uL (ref 0.7–4.0)
Monocytes Absolute: 0.5 10*3/uL (ref 0.1–1.0)
Neutrophils Relative %: 82 % — ABNORMAL HIGH (ref 43–77)

## 2011-04-09 LAB — URINALYSIS, ROUTINE W REFLEX MICROSCOPIC
Nitrite: NEGATIVE
Specific Gravity, Urine: 1.019 (ref 1.005–1.030)
Urobilinogen, UA: 1 mg/dL (ref 0.0–1.0)
pH: 5.5 (ref 5.0–8.0)

## 2011-04-09 LAB — URINE MICROSCOPIC-ADD ON

## 2011-04-09 MED ORDER — SODIUM CHLORIDE 0.9 % IV BOLUS (SEPSIS)
500.0000 mL | Freq: Once | INTRAVENOUS | Status: AC
  Administered 2011-04-09: 500 mL via INTRAVENOUS

## 2011-04-09 NOTE — ED Provider Notes (Addendum)
History     CSN: [id number] Arrival date & time: 04/09/2011  4:56 PM   First MD Initiated Contact with Patient 04/09/11 1823      Chief Complaint  Patient presents with  . Altered Mental Status     HPI Pt presenting to ed with c/o altered mental status and decreased appetite, weight loss and r/o low hemoglobin. Pt [facility]  Past Medical History  Diagnosis Date  . CVA (cerebral infarction) 1981    MI  . Type II or unspecified type diabetes mellitus without mention of complication, not stated as uncontrolled   . Diverticulosis   . History of GI diverticular bleed 2007  . HTN (hypertension)   . AAA (abdominal aortic aneurysm)     CT 2006 3.2 cm  . Bladder cancer     Transitional cell carcinoma excised 2006  . Renal malignant tumor     transitional cell carcinoma lasered 2006  . Hyperlipidemia   . Hydrocele, left   . Inguinal hernia     Past Surgical History  Procedure Date  . Coronary artery bypass graft '94    LIMA-LAD, SVG-OM, PD, RCA  . Cataract extraction 2007  . Renal laser ablation of tumor 2006  . Coronary artery bypass graft     59yrs ago  . Appendectomy   . Eye surgery     History reviewed. No pertinent family history.  History  Substance Use Topics  . Smoking status: Former Games developer  . Smokeless tobacco: Not on file  . Alcohol Use: Not on file     occassionally      Review of Systems  Unable to perform ROS: Other    Allergies  Review of patient's allergies indicates no known allergies.  Home Medications   Current Outpatient Rx  Name Route Sig Dispense Refill  . AMIODARONE HCL 200 MG PO TABS Oral Take 200 mg by mouth daily.      . ASPIRIN EC 325 MG PO TBEC Oral Take 325 mg by mouth daily.      . ATORVASTATIN CALCIUM 10 MG PO TABS Oral Take 10 mg by mouth every Monday, Wednesday, and Friday.     Marland Kitchen BISACODYL 10 MG RE SUPP Rectal Place 10 mg rectally daily as needed. For constipation.     Marland Kitchen  FERROUS SULFATE 325 (65 FE) MG PO TABS Oral Take 325 mg by mouth 2 (two) times daily.      Marland Kitchen HYDROCHLOROTHIAZIDE 12.5 MG PO CAPS Oral Take 12.5 mg by mouth daily.      . INSULIN DETEMIR 100 UNIT/ML [place] SOLN Subcutaneous Inject 5 Units into the skin daily.     Marland Kitchen OMEPRAZOLE 20 MG PO CPDR Oral Take 20 mg by mouth daily.      Marland Kitchen POLYETHYLENE GLYCOL 3350 PO PACK Oral Take 17 g by mouth every other day. Hold for constipation.     Marland Kitchen RAMIPRIL 2.5 MG PO CAPS Oral Take 2.5 mg by mouth daily.      Marland Kitchen VITAMIN B-12 1000 MCG PO TABS Oral Take 1,000 mcg by mouth daily.      . INSULIN PEN NEEDLE 32G X 6 MM MISC  as directed.        BP 138/61  Pulse 74  Temp(Src) 98 F (36.7 C) (Oral)  Resp 18  SpO2 100%  Physical Exam  Nursing note and vitals reviewed. Constitutional: He appears well-developed.  HENT:  Head: Atraumatic.  Eyes: Conjunctivae are normal.  Neck: Normal range of motion.  Cardiovascular: Normal rate and intact distal pulses.   Pulmonary/Chest: Effort normal. No respiratory distress.  Abdominal: Soft. Normal appearance. He exhibits no distension. There is no tenderness. There is no rebound.  Musculoskeletal: Normal range of motion.  Neurological: He displays normal reflexes. No cranial nerve deficit.  Skin: Skin is warm and dry. No rash noted. He is not diaphoretic.  Psychiatric: He has a normal mood and affect. His behavior is normal.    ED Course  Procedures (including critical care time)  Labs Reviewed  CBC - Abnormal; Notable for the following:    RBC 3.52 (*)    Hemoglobin 9.1 (*)    HCT 28.5 (*)    MCH 25.9 (*)    RDW 21.5 (*)    All other components within normal limits  DIFFERENTIAL - Abnormal; Notable for the following:    Neutrophils Relative 82 (*)    Neutro Abs 8.3 (*)    All other components within normal limits  COMPREHENSIVE METABOLIC PANEL - Abnormal; Notable for the following:    Sodium 129 (*)    Chloride 92 (*)    Glucose, Bld 199 (*)    BUN 30 (*)     Albumin 3.3 (*)    Total Bilirubin 0.2 (*)    GFR calc non Af Amer 49 (*)    GFR calc Af Amer 56 (*)    All other components within normal limits  URINALYSIS, ROUTINE W REFLEX MICROSCOPIC - Abnormal; Notable for the following:    Appearance CLOUDY (*)    Glucose, UA 250 (*)    Leukocytes, UA SMALL (*)    All other components within normal limits  URINE MICROSCOPIC-ADD ON  URINE CULTURE   Dg Chest 2 View  04/09/2011  *RADIOLOGY REPORT*  Clinical Data: Altered level of consciousness, hypertension  CHEST - 2 VIEW  Comparison: 06/30/2010.  Findings:  Low lung volumes.  Chronic left hemidiaphragm elevation. Cardiomegaly. Calcified tortuous aorta.  Prior CABG.  Mild vascular congestion without overt infiltrates or failure.  IMPRESSION: Mild vascular congestion.  No active infiltrates. Slight clearing of bibasilar atelectasis compared with priors.  Original Report Authenticated By: Elsie Stain, M.D.   Ct Head Wo Contrast  04/09/2011  *RADIOLOGY REPORT*  Clinical Data: Altered level of consciousness  CT HEAD WITHOUT CONTRAST  Technique:  Contiguous axial images were obtained from the base of the skull through the vertex without contrast.  Comparison: 04/10/2010  Findings: Chronic ischemic changes and global atrophy.  No mass effect, midline shift, or acute intracranial hemorrhage.  Mastoid air cells are clear.  Visualized paranasal sinuses are clear. Cranium is intact.  IMPRESSION: No acute intracranial pathology.  Original Report Authenticated By: Donavan Burnet, M.D.     I spoke with the family and discuss the results with him.  At this time the patient can return to the nursing home facility and Dr. Chilton Si can continue with his workup.  Diagnosis: Transient confusion.   MDM          Nelia Shi, MD 04/09/11 2035  Nelia Shi, MD 06/21/11 [phone]

## 2011-04-09 NOTE — ED Notes (Signed)
Pt presenting to ed with c/o altered mental status and decreased appetite, weight loss and r/o low hemoglobin. Pt told to present [facility] from [facility]

## 2011-04-12 LAB — URINE CULTURE: Culture  Setup Time: 201211090206

## 2011-04-27 DIAGNOSIS — R269 Unspecified abnormalities of gait and mobility: Secondary | ICD-10-CM

## 2011-04-27 HISTORY — DX: Unspecified abnormalities of gait and mobility: R26.9

## 2011-05-07 DIAGNOSIS — E871 Hypo-osmolality and hyponatremia: Secondary | ICD-10-CM

## 2011-05-07 DIAGNOSIS — F329 Major depressive disorder, single episode, unspecified: Secondary | ICD-10-CM

## 2011-05-07 HISTORY — DX: Hypo-osmolality and hyponatremia: E87.1

## 2011-05-07 HISTORY — DX: Major depressive disorder, single episode, unspecified: F32.9

## 2011-05-12 ENCOUNTER — Encounter: Payer: Self-pay | Admitting: Internal Medicine

## 2011-06-11 DIAGNOSIS — M216X9 Other acquired deformities of unspecified foot: Secondary | ICD-10-CM

## 2011-06-11 DIAGNOSIS — R404 Transient alteration of awareness: Secondary | ICD-10-CM

## 2011-06-11 DIAGNOSIS — C4440 Unspecified malignant neoplasm of skin of scalp and neck: Secondary | ICD-10-CM

## 2011-06-11 HISTORY — DX: Unspecified malignant neoplasm of skin of scalp and neck: C44.40

## 2011-06-11 HISTORY — DX: Other acquired deformities of unspecified foot: M21.6X9

## 2011-06-11 HISTORY — DX: Transient alteration of awareness: R40.4

## 2011-06-25 DIAGNOSIS — Z9181 History of falling: Secondary | ICD-10-CM

## 2011-06-25 HISTORY — DX: History of falling: Z91.81

## 2011-07-09 DIAGNOSIS — M545 Low back pain, unspecified: Secondary | ICD-10-CM

## 2011-07-09 HISTORY — DX: Low back pain, unspecified: M54.50

## 2011-07-29 DIAGNOSIS — K5900 Constipation, unspecified: Secondary | ICD-10-CM

## 2011-07-29 HISTORY — DX: Constipation, unspecified: K59.00

## 2011-08-26 DIAGNOSIS — I872 Venous insufficiency (chronic) (peripheral): Secondary | ICD-10-CM

## 2011-08-26 HISTORY — DX: Venous insufficiency (chronic) (peripheral): I87.2

## 2011-09-04 ENCOUNTER — Other Ambulatory Visit: Payer: Self-pay | Admitting: Plastic Surgery

## 2011-09-07 ENCOUNTER — Encounter (HOSPITAL_BASED_OUTPATIENT_CLINIC_OR_DEPARTMENT_OTHER): Payer: Self-pay | Admitting: *Deleted

## 2011-09-07 NOTE — Progress Notes (Signed)
[facility]ed [facility]-got med list-faxed then preop instructions-what meds to take. Talked with daughter and daughter in law where to come- and when. Got notes from dr Tresa Endo with ekg 12/12-needs istat

## 2011-09-10 ENCOUNTER — Encounter (HOSPITAL_BASED_OUTPATIENT_CLINIC_OR_DEPARTMENT_OTHER)
Admission: RE | Disposition: A | Discharge status: Home / Self Care | Payer: Self-pay | Source: Ambulatory Visit | Attending: Plastic Surgery

## 2011-09-10 ENCOUNTER — Encounter (HOSPITAL_BASED_OUTPATIENT_CLINIC_OR_DEPARTMENT_OTHER): Payer: Self-pay | Admitting: Anesthesiology

## 2011-09-10 ENCOUNTER — Encounter (HOSPITAL_BASED_OUTPATIENT_CLINIC_OR_DEPARTMENT_OTHER): Payer: Self-pay | Admitting: *Deleted

## 2011-09-10 ENCOUNTER — Ambulatory Visit (HOSPITAL_BASED_OUTPATIENT_CLINIC_OR_DEPARTMENT_OTHER)
Admission: RE | Admit: 2011-09-10 | Discharge: 2011-09-10 | Disposition: A | Discharge status: Home / Self Care | Payer: Medicare Other | Source: Ambulatory Visit | Attending: Plastic Surgery | Admitting: Plastic Surgery

## 2011-09-10 DIAGNOSIS — Z538 Procedure and treatment not carried out for other reasons: Secondary | ICD-10-CM | POA: Insufficient documentation

## 2011-09-10 HISTORY — DX: Unspecified osteoarthritis, unspecified site: M19.90

## 2011-09-10 HISTORY — DX: Cardiac arrhythmia, unspecified: I49.9

## 2011-09-10 HISTORY — DX: Gastro-esophageal reflux disease without esophagitis: K21.9

## 2011-09-10 HISTORY — DX: Anemia, unspecified: D64.9

## 2011-09-10 HISTORY — DX: Atherosclerotic heart disease of native coronary artery without angina pectoris: I25.10

## 2011-09-10 SURGERY — IRRIGATION AND DEBRIDEMENT WOUND
Anesthesia: General

## 2011-09-10 SURGICAL SUPPLY — 85 items
APL SKNCLS STERI-STRIP NONHPOA (GAUZE/BANDAGES/DRESSINGS)
BAG DECANTER FOR FLEXI CONT (MISCELLANEOUS) IMPLANT
BANDAGE ELASTIC 3 VELCRO ST LF (GAUZE/BANDAGES/DRESSINGS) IMPLANT
BANDAGE ELASTIC 4 VELCRO ST LF (GAUZE/BANDAGES/DRESSINGS) IMPLANT
BANDAGE ELASTIC 6 VELCRO ST LF (GAUZE/BANDAGES/DRESSINGS) IMPLANT
BANDAGE GAUZE ELAST BULKY 4 IN (GAUZE/BANDAGES/DRESSINGS) IMPLANT
BENZOIN TINCTURE PRP APPL 2/3 (GAUZE/BANDAGES/DRESSINGS) IMPLANT
BLADE MINI RND TIP GREEN BEAV (BLADE) IMPLANT
BLADE SURG 10 STRL SS (BLADE) IMPLANT
BLADE SURG 15 STRL LF DISP TIS (BLADE) ×1 IMPLANT
BLADE SURG 15 STRL SS (BLADE)
BNDG CMPR 9X4 STRL LF SNTH (GAUZE/BANDAGES/DRESSINGS)
BNDG COHESIVE 1X5 TAN STRL LF (GAUZE/BANDAGES/DRESSINGS) IMPLANT
BNDG COHESIVE 4X5 TAN STRL (GAUZE/BANDAGES/DRESSINGS) IMPLANT
BNDG ESMARK 4X9 LF (GAUZE/BANDAGES/DRESSINGS) IMPLANT
CANISTER OMNI JUG 16 LITER (MISCELLANEOUS) IMPLANT
CANISTER SUCTION 1200CC (MISCELLANEOUS) IMPLANT
CANISTER SUCTION 2500CC (MISCELLANEOUS) IMPLANT
CHLORAPREP W/TINT 26ML (MISCELLANEOUS) IMPLANT
CLOTH BEACON ORANGE TIMEOUT ST (SAFETY) ×1 IMPLANT
CORDS BIPOLAR (ELECTRODE) IMPLANT
COVER MAYO STAND STRL (DRAPES) ×1 IMPLANT
COVER TABLE BACK 60X90 (DRAPES) ×1 IMPLANT
DECANTER SPIKE VIAL GLASS SM (MISCELLANEOUS) IMPLANT
DRAIN PENROSE 1/2X12 LTX STRL (WOUND CARE) IMPLANT
DRAPE EXTREMITY T 121X128X90 (DRAPE) IMPLANT
DRAPE INCISE IOBAN 66X45 STRL (DRAPES) IMPLANT
DRSG EMULSION OIL 3X3 NADH (GAUZE/BANDAGES/DRESSINGS) IMPLANT
DRSG PAD ABDOMINAL 8X10 ST (GAUZE/BANDAGES/DRESSINGS) IMPLANT
ELECT COATED BLADE 2.86 ST (ELECTRODE) IMPLANT
ELECT NDL TIP 2.8 STRL (NEEDLE) IMPLANT
ELECT NEEDLE TIP 2.8 STRL (NEEDLE) IMPLANT
ELECT REM PT RETURN 9FT ADLT (ELECTROSURGICAL)
ELECTRODE REM PT RTRN 9FT ADLT (ELECTROSURGICAL) ×1 IMPLANT
GAUZE SPONGE 4X4 12PLY STRL LF (GAUZE/BANDAGES/DRESSINGS) IMPLANT
GAUZE XEROFORM 1X8 LF (GAUZE/BANDAGES/DRESSINGS) IMPLANT
GAUZE XEROFORM 5X9 LF (GAUZE/BANDAGES/DRESSINGS) IMPLANT
GLOVE BIO SURGEON STRL SZ 6.5 (GLOVE) ×1 IMPLANT
GOWN PREVENTION PLUS XLARGE (GOWN DISPOSABLE) ×1 IMPLANT
HANDPIECE INTERPULSE COAX TIP (DISPOSABLE)
IV NS IRRIG 3000ML ARTHROMATIC (IV SOLUTION) IMPLANT
NDL HYPO 30GX1 BEV (NEEDLE) IMPLANT
NEEDLE 27GAX1X1/2 (NEEDLE) IMPLANT
NEEDLE HYPO 30GX1 BEV (NEEDLE) IMPLANT
NS IRRIG 1000ML POUR BTL (IV SOLUTION) ×1 IMPLANT
PACK BASIN DAY SURGERY FS (CUSTOM PROCEDURE TRAY) ×1 IMPLANT
PADDING CAST ABS 3INX4YD NS (CAST SUPPLIES)
PADDING CAST ABS 4INX4YD NS (CAST SUPPLIES)
PADDING CAST ABS COTTON 3X4 (CAST SUPPLIES) IMPLANT
PADDING CAST ABS COTTON 4X4 ST (CAST SUPPLIES) IMPLANT
PENCIL BUTTON HOLSTER BLD 10FT (ELECTRODE) IMPLANT
SET HNDPC FAN SPRY TIP SCT (DISPOSABLE) IMPLANT
SHEET MEDIUM DRAPE 40X70 STRL (DRAPES) IMPLANT
SLEEVE SCD COMPRESS KNEE MED (MISCELLANEOUS) IMPLANT
SPLINT PLASTER CAST XFAST 3X15 (CAST SUPPLIES) IMPLANT
SPLINT PLASTER XTRA FASTSET 3X (CAST SUPPLIES)
SPONGE GAUZE 4X4 12PLY (GAUZE/BANDAGES/DRESSINGS) ×1 IMPLANT
SPONGE LAP 18X18 X RAY DECT (DISPOSABLE) IMPLANT
SPONGE LAP 4X18 X RAY DECT (DISPOSABLE) IMPLANT
STAPLER VISISTAT 35W (STAPLE) IMPLANT
STOCKINETTE 4X48 STRL (DRAPES) IMPLANT
STOCKINETTE 6  STRL (DRAPES)
STOCKINETTE 6 STRL (DRAPES) ×1 IMPLANT
STOCKINETTE IMPERVIOUS LG (DRAPES) IMPLANT
STRIP CLOSURE SKIN 1/2X4 (GAUZE/BANDAGES/DRESSINGS) IMPLANT
SUCTION FRAZIER TIP 10 FR DISP (SUCTIONS) IMPLANT
SURGILUBE 2OZ TUBE FLIPTOP (MISCELLANEOUS) IMPLANT
SUT ETHILON 3 0 PS 1 (SUTURE) IMPLANT
SUT ETHILON 4 0 P 3 18 (SUTURE) IMPLANT
SUT ETHILON 5 0 PS 2 18 (SUTURE) IMPLANT
SUT PROLENE 3 0 PS 2 (SUTURE) IMPLANT
SUT SILK 3 0 PS 1 (SUTURE) IMPLANT
SUT VIC AB 3-0 FS2 27 (SUTURE) IMPLANT
SUT VIC AB 5-0 P-3 18X BRD (SUTURE) IMPLANT
SUT VIC AB 5-0 P3 18 (SUTURE)
SUT VIC AB 5-0 PS2 18 (SUTURE) IMPLANT
SYR BULB IRRIGATION 50ML (SYRINGE) IMPLANT
SYR CONTROL 10ML LL (SYRINGE) ×1 IMPLANT
TAPE HYPAFIX 6X30 (GAUZE/BANDAGES/DRESSINGS) IMPLANT
TOWEL OR 17X24 6PK STRL BLUE (TOWEL DISPOSABLE) ×1 IMPLANT
TRAY DSU PREP LF (CUSTOM PROCEDURE TRAY) IMPLANT
TUBE CONNECTING 20X1/4 (TUBING) IMPLANT
UNDERPAD 30X30 INCONTINENT (UNDERPADS AND DIAPERS) ×1 IMPLANT
WATER STERILE IRR 1000ML POUR (IV SOLUTION) ×1 IMPLANT
YANKAUER SUCT BULB TIP NO VENT (SUCTIONS) IMPLANT

## 2011-09-10 NOTE — Progress Notes (Signed)
Pt reports he ate a piece of chocolate candy at 0800. Discussed with Dr. Noreene Larsson who said surgery would require postponement. Dr. Kelly Splinter unable to postpone and will call pt to reschedule.

## 2011-09-11 ENCOUNTER — Encounter (HOSPITAL_BASED_OUTPATIENT_CLINIC_OR_DEPARTMENT_OTHER): Payer: Self-pay | Admitting: Plastic Surgery

## 2011-09-24 NOTE — Progress Notes (Signed)
To arrive by [id number] Will ck to see if connie coming

## 2011-09-28 ENCOUNTER — Encounter (HOSPITAL_BASED_OUTPATIENT_CLINIC_OR_DEPARTMENT_OTHER): Admission: RE | Payer: Self-pay | Source: Ambulatory Visit

## 2011-09-28 ENCOUNTER — Ambulatory Visit (HOSPITAL_BASED_OUTPATIENT_CLINIC_OR_DEPARTMENT_OTHER): Admission: RE | Admit: 2011-09-28 | Payer: Medicare Other | Source: Ambulatory Visit | Admitting: Plastic Surgery

## 2011-09-28 SURGERY — IRRIGATION AND DEBRIDEMENT WOUND
Anesthesia: General

## 2011-10-19 DIAGNOSIS — D485 Neoplasm of uncertain behavior of skin: Secondary | ICD-10-CM

## 2011-10-19 HISTORY — DX: Neoplasm of uncertain behavior of skin: D48.5

## 2011-11-04 NOTE — H&P (Signed)
  History and Physical  Olney Monier   08/25/2011 3:00 PM Office Visit  MRN: [id number]  Department: [facility]  Dept Phone: [phone]  Description: Male DOB: [birth year]  Provider: Wayland Denis, DO   Diagnoses  -  Squamous cell carcinoma of scalp   - Primary   173.42      Subjective:    Patient ID: [name] [name] is a 78 y.o. male.  HPI Patient is an 78 yrs old wm here with his son for a history and physical for repair of a scalp wound. He underwent excision of a Squamous cell carcinoma of his scalp 04/15/12 by Moh's technique. Clear margins were obtained. He was skin grafted and the graft broke down. He has been doing dressing changes over the past two months with triple antibiotic ointment. He is at a retirement home at the present moment. The wound is 3 x 3 cm with bone exposed. There is minimal periwound redness and no sign of infection.  He is doing dressing changes at home.  The following portions of the patient's history were reviewed and updated as appropriate: allergies, current medications, past family history, past medical history, past social history, past surgical history and problem list.  Review of Systems  Constitutional: Negative.   HENT: Negative.   Respiratory: Negative.   Cardiovascular: Negative.   Gastrointestinal: Negative.   Genitourinary: Negative.   Musculoskeletal: Negative.   Neurological: Negative.   Psychiatric/Behavioral: Negative.       Objective:    Physical Exam  Constitutional: He appears well-developed and well-nourished.  HENT:   Head: Normocephalic.   Eyes: Pupils are equal, round, and reactive to light.  Cardiovascular: Normal rate.   Pulmonary/Chest: Effort normal. No respiratory distress.  Neurological: He is alert.  Psychiatric: He has a normal mood and affect. His behavior is normal. Judgment and thought content normal.  Assessment:   Squamous cell carcinoma of scalp        Plan:   Plan for repair in the operating  room with local flap, Acell or skin graft.  Risks and complications were reviewed and include bleeding, pain scar and risk of anesthesia.

## 2012-01-20 DIAGNOSIS — J189 Pneumonia, unspecified organism: Secondary | ICD-10-CM

## 2012-01-20 HISTORY — DX: Pneumonia, unspecified organism: J18.9

## 2012-02-03 DIAGNOSIS — M7020 Olecranon bursitis, unspecified elbow: Secondary | ICD-10-CM

## 2012-02-03 HISTORY — DX: Olecranon bursitis, unspecified elbow: M70.20

## 2012-04-13 LAB — TSH: TSH: 2.32 u[IU]/mL (ref <=5.90)

## 2012-05-17 ENCOUNTER — Other Ambulatory Visit (HOSPITAL_COMMUNITY): Payer: Self-pay | Admitting: Cardiovascular Disease

## 2012-05-17 DIAGNOSIS — I2584 Coronary atherosclerosis due to calcified coronary lesion: Secondary | ICD-10-CM

## 2012-06-02 LAB — CBC AND DIFFERENTIAL
HCT: 37 % — AB (ref 41–53)
Hemoglobin: 12.4 g/dL — AB (ref 13.5–17.5)
Platelets: 179 10*3/uL (ref 150–399)
WBC: 8.1 10^3/mL

## 2012-06-13 DIAGNOSIS — I509 Heart failure, unspecified: Secondary | ICD-10-CM

## 2012-06-13 HISTORY — DX: Heart failure, unspecified: I50.9

## 2012-06-22 DIAGNOSIS — E876 Hypokalemia: Secondary | ICD-10-CM

## 2012-06-22 DIAGNOSIS — R609 Edema, unspecified: Secondary | ICD-10-CM

## 2012-06-22 HISTORY — DX: Hypokalemia: E87.6

## 2012-06-22 HISTORY — DX: Edema, unspecified: R60.9

## 2012-07-24 ENCOUNTER — Encounter: Payer: Self-pay | Admitting: *Deleted

## 2012-07-29 IMAGING — CT CT HEAD W/O CM
2 series · 16 of 30 positions shown, 20 images · non-contrast
Comparison: 04/10/2010

CLINICAL DATA: Altered level of consciousness

CT HEAD WITHOUT CONTRAST
TECHNIQUE: Contiguous axial images were obtained from the base of
the skull through the vertex without contrast.

[Series 2: head w/o · axial · non-contrast · 0.48mm/px · z∈[-146,-21]mm · 13 of 31 slices shown, 17 images]
[im 3/31  brain]
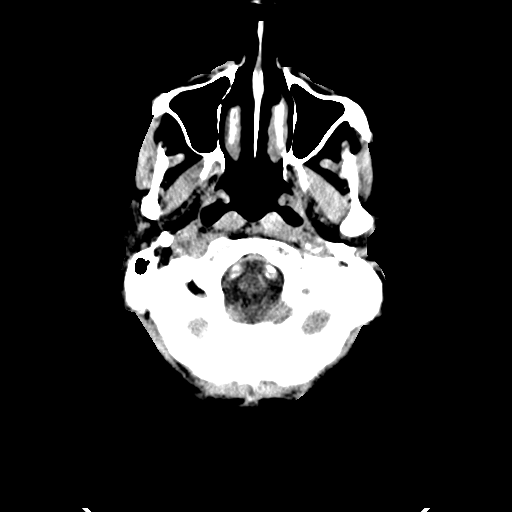
[im 3/31  bone]
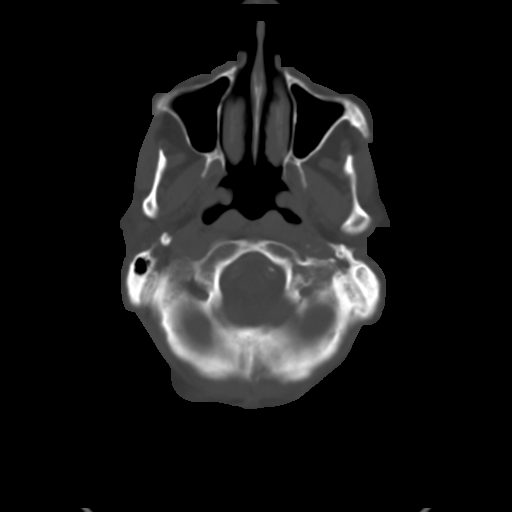
[im 5/31  brain]
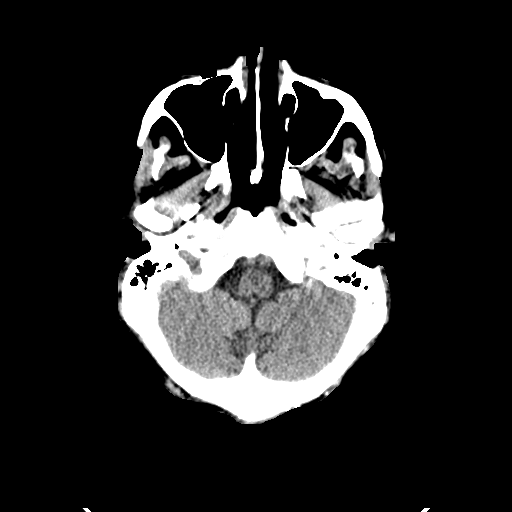
[im 7/31  brain]
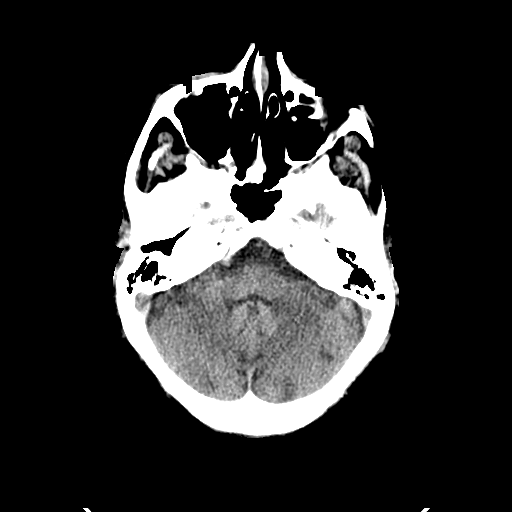
[im 9/31  brain]
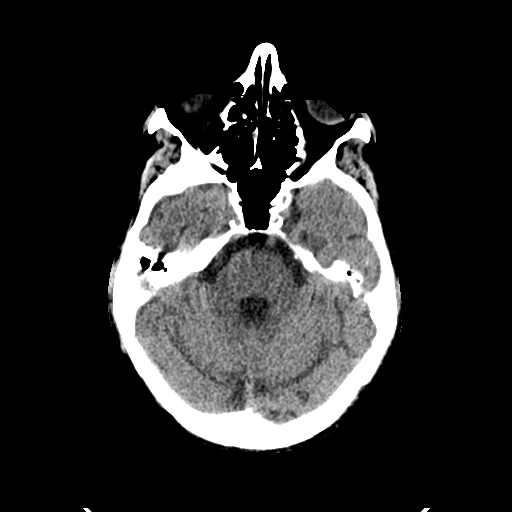
[im 11/31  brain]
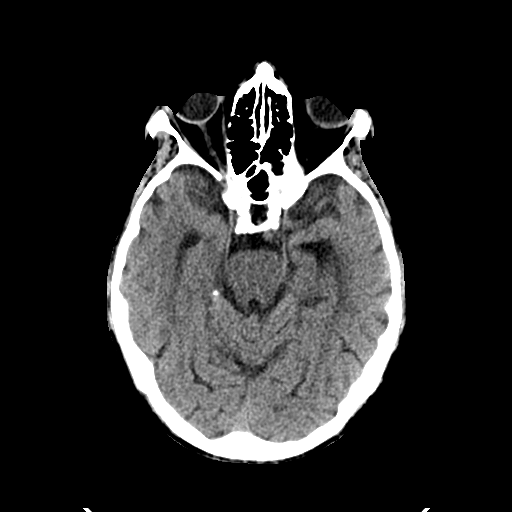
[im 11/31  bone]
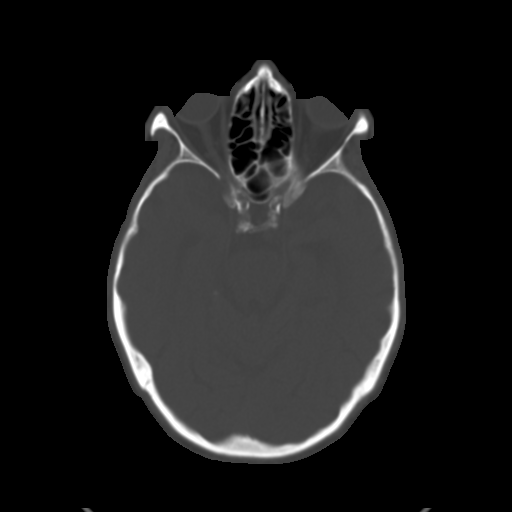
[im 13/31  brain]
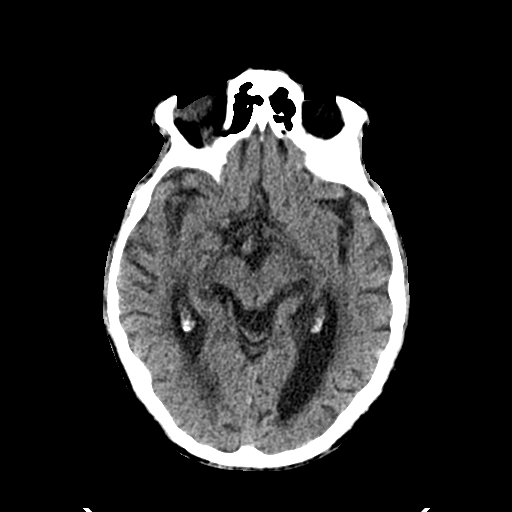
[im 16/31  brain]
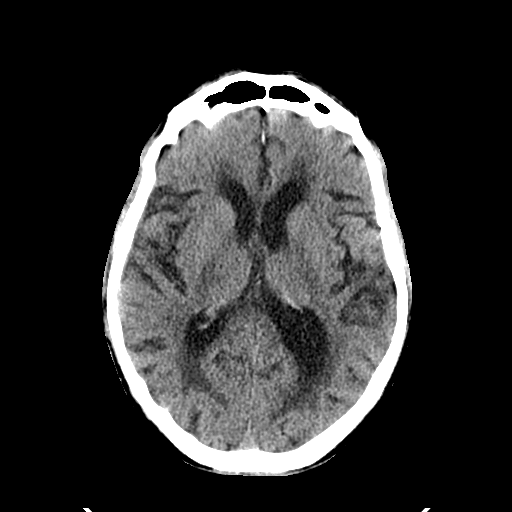
[im 18/31  brain]
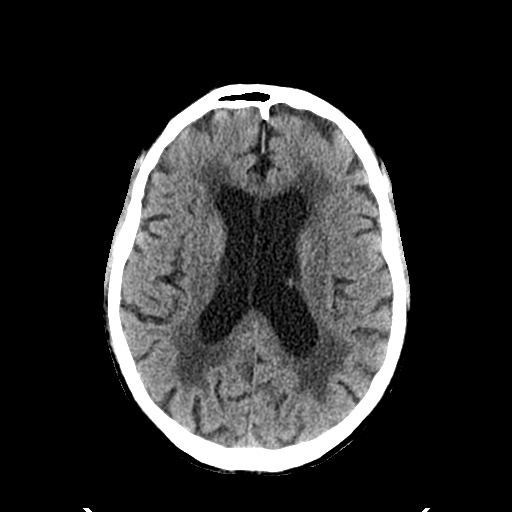
[im 20/31  brain]
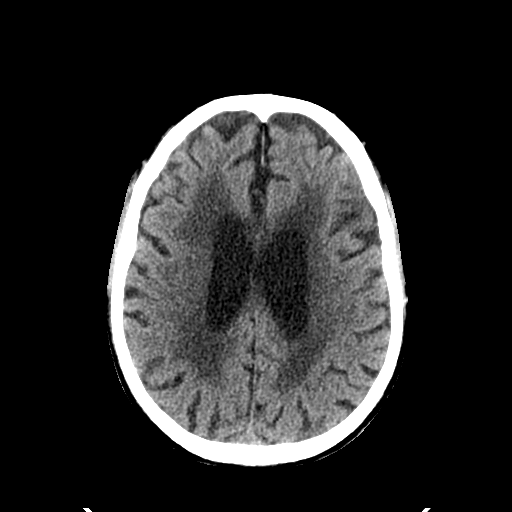
[im 20/31  bone]
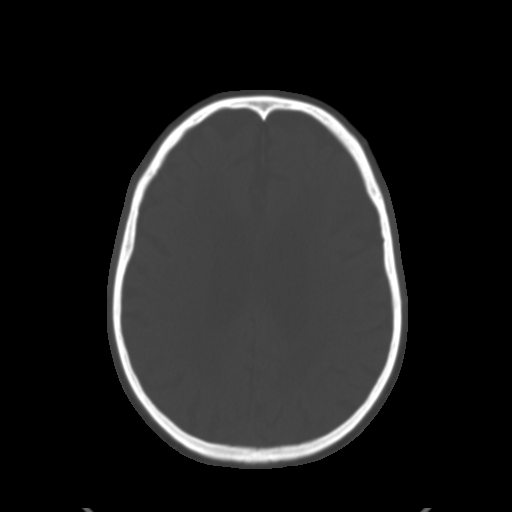
[im 22/31  brain]
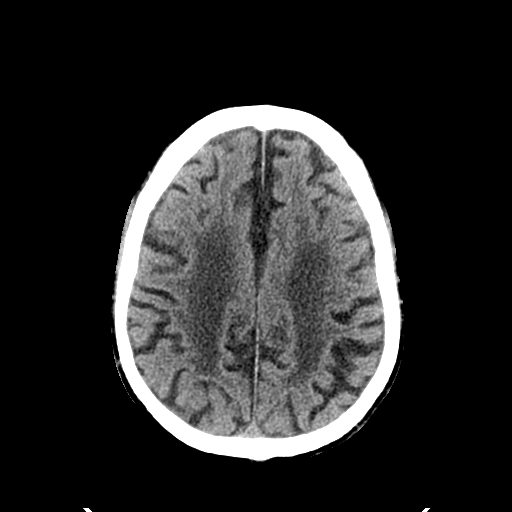
[im 24/31  brain]
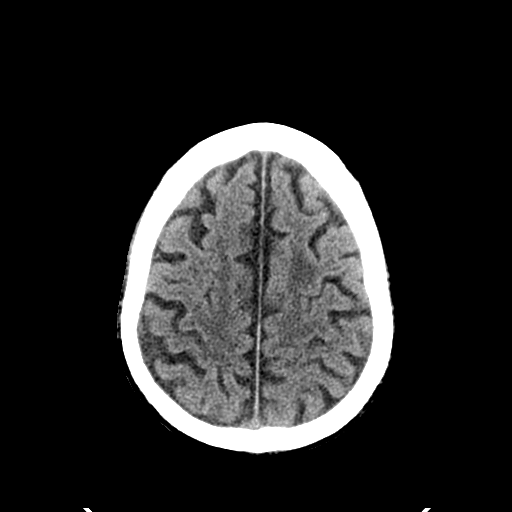
[im 26/31  brain]
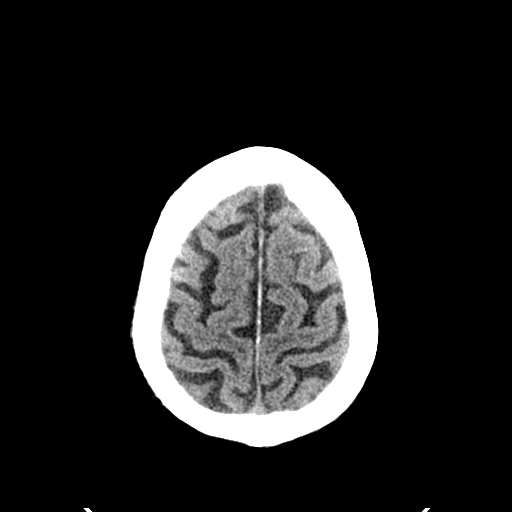
[im 28/31  brain]
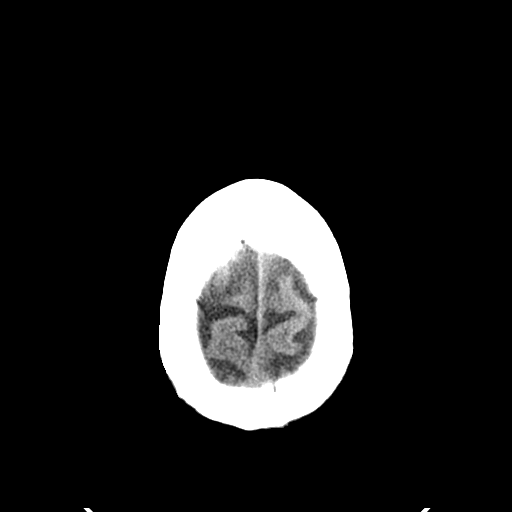
[im 28/31  bone]
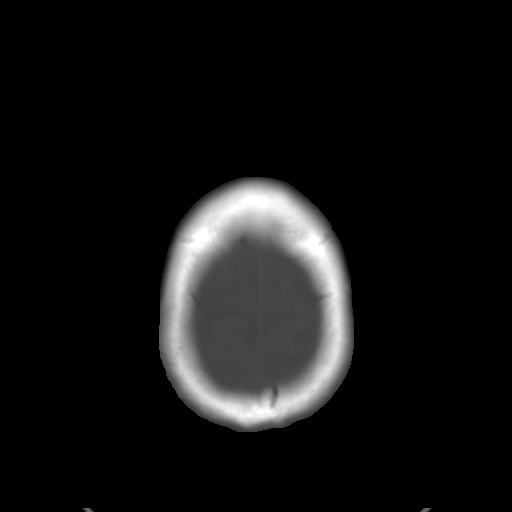

[Series 3: bone windows · axial · 0.48mm/px · z∈[-146,-106]mm · 3 of 31 slices shown]
[im 3/31  bone]
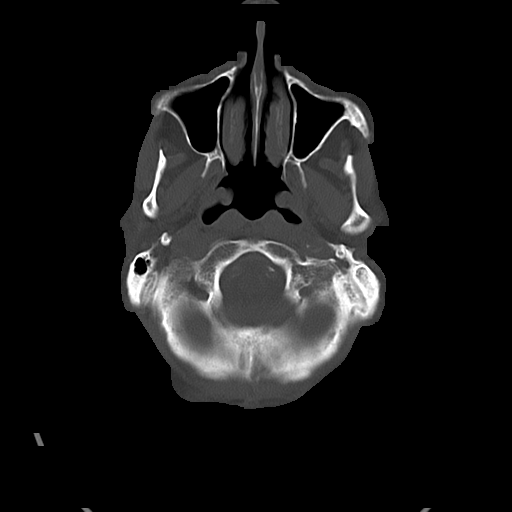
[im 7/31  bone]
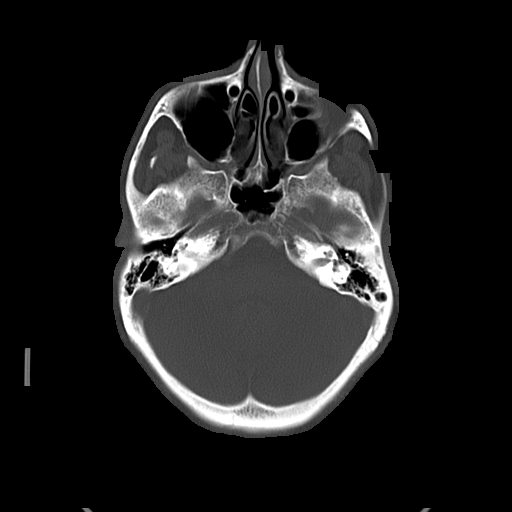
[im 11/31  bone]
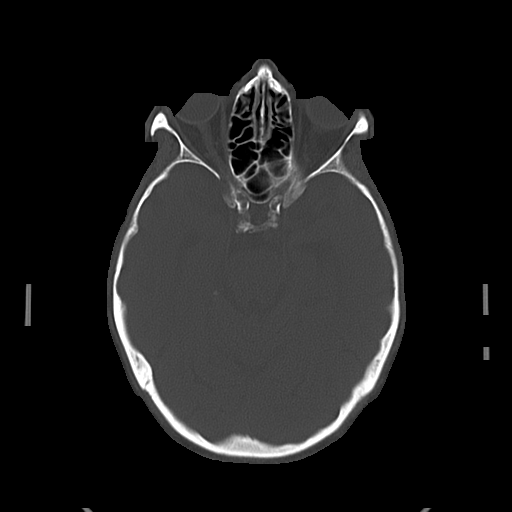

[16 of 30 positions shown; findings below may reference images not displayed]

FINDINGS: Chronic ischemic changes and global atrophy.  No mass
effect, midline shift, or acute intracranial hemorrhage.  Mastoid
air cells are clear.  Visualized paranasal sinuses are clear.
Cranium is intact.
IMPRESSION: No acute intracranial pathology.

## 2012-09-01 ENCOUNTER — Non-Acute Institutional Stay (SKILLED_NURSING_FACILITY): Payer: Medicare Other | Admitting: Nurse Practitioner

## 2012-09-01 DIAGNOSIS — D649 Anemia, unspecified: Secondary | ICD-10-CM

## 2012-09-01 DIAGNOSIS — I10 Essential (primary) hypertension: Secondary | ICD-10-CM

## 2012-09-01 DIAGNOSIS — E119 Type 2 diabetes mellitus without complications: Secondary | ICD-10-CM

## 2012-09-01 DIAGNOSIS — R413 Other amnesia: Secondary | ICD-10-CM

## 2012-09-01 NOTE — Progress Notes (Signed)
Subjective:    Patient ID: [name] [name], male    DOB: [birth year], 79 y.o.   MRN: [id number]  HPI   08/03/12 BLE edema trace on Furosemide 10mg , serum K 3.71/28/14 on Kcl  Hx of dementia: less agitation and confusion since Aricept stopped.  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 MMSE 19/2013. Aricept stopped relate increased confusion/agitation.   DEPRESSION, ACUTE MAJOR  stable off Mirtazapine 7.5mg    HTN UNSPECIFIED  controlled Lisinopril 5mg   MYOCARDIAL INFACTON, OLD  Bypass 1982, 1994  CAD  ASA and statin for risk reduction  AFIB  had cardioversion 07/2010, on ASA,  Amiodarone, rate controlled.    CVA, LATE EFFECT  1981 no apparent residual.   AAA S RUPTURE  3.2 cm CT 2006   DVT  1982  GERD  stable on Omeprazole.   INGUINAL HERNIA  hx of it.   DIVERTICULITIS  GI bleed in the past 2007  HYDROCELE  left   Review of Systems  Constitutional: Negative for fever, chills, diaphoresis and fatigue.  HENT: Positive for hearing loss (mild). Negative for ear pain, congestion, rhinorrhea, trouble swallowing, neck pain, neck stiffness, voice change and sinus pressure.   Eyes: Negative for pain, redness, itching and visual disturbance.  Respiratory: Negative for cough, choking, chest tightness, shortness of breath and wheezing.   Cardiovascular: Positive for leg swelling (trace). Negative for chest pain and palpitations.  Gastrointestinal: Negative for abdominal pain, constipation and abdominal distention.   Endocrine: Negative for cold intolerance, heat intolerance, polydipsia, polyphagia and polyuria.       Has been treated for type II diabetes  Genitourinary: Positive for frequency (drippling ). Negative for urgency and flank pain.  Musculoskeletal: Positive for back pain, arthralgias and gait problem (walker and w/c).  Skin: Positive for rash (BLE itching and rash resolved on Benadryl and hydrocortisone topical). Negative for wound.  Allergic/Immunologic: Negative.   Neurological: Negative for tremors, seizures, facial asymmetry, speech difficulty, weakness, numbness and headaches.  Hematological: Negative.   Psychiatric/Behavioral: Positive for confusion (related to dementia). Negative for hallucinations, behavioral problems, sleep disturbance, dysphoric mood, decreased concentration and agitation. The patient is not nervous/anxious.        Objective:   Physical Exam  Constitutional: He is oriented to person, place, and time. He appears well-developed and well-nourished.  HENT:  Head: Normocephalic and atraumatic.  Eyes: Conjunctivae and EOM are normal. Pupils are equal, round, and reactive to light.  Neck: Normal range of motion. Neck supple. No JVD present. No thyromegaly present.  Cardiovascular: Normal rate, regular rhythm and normal heart sounds.   No murmur heard. Pulmonary/Chest: Effort normal. He has no wheezes. He has rales (bibailar dry rales). He exhibits no tenderness.  Abdominal: Soft. Bowel sounds are normal. There is no tenderness.  Musculoskeletal: Normal range of motion. He exhibits no edema and no tenderness.  Lymphadenopathy:    He has no cervical adenopathy.  Neurological: He is alert and oriented to person, place, and time. He has normal reflexes. He displays normal reflexes. No  cranial nerve deficit. He exhibits normal muscle tone. Coordination normal.  Skin: Skin is warm and dry. No rash noted. No erythema.  Psychiatric: He has a normal mood and affect. His  behavior is normal. Judgment and thought content normal.    Lab reviewed:  10/15/11 CBC wbc 6.3, Hgb 11.5, Hct 35.2, plt 231   BMP Na 135, K 4.4, glucose 52, Bun 26, creatinine 1.01, Ca 9.2 11/24/11 BMP Na 133, K 4.9, glucose 131, Bun 30, creatinine 1.16, Ca 8.9   Hgb A1c 7.4 12/21/11 UA 80,000c/ml lactobacillus--not treated 12/22/11 BMP Na 131, K 4.0, glucose 75, Bun 30, creatinine 1.15, Ca 8.9 01/14/12 CBC wbc 7.7, Hgb 10.9, Hct 30.9, Plt 209   BMP Na 135, K 4.1, glucose 144, Bun 28, creatinine 1.02, Ca 8.6 01/21/12 BNP 179.9 04/13/12  CBC wbc 6.7, Hgb 12.2, Hct 35.6, plt 208   CMP Na 143, K 4.0, glucose 94, Bun 37, creatinine 1.07, Ca 9.1, LFT wnl   TSH 2.318   Hgb A1c 6.6 05/08/12  UA no growth.  06/14/12  CMP Na 141, K 4.1, glucose 238, Bun 36, creatinine 1.09, Ca 8.7, LFT wnl except total protein 5.5, albumin 3.2   BNP 159 06/21/12  BMP Na 146, K 3.4, glucose 137, Bun 38, creatinine 1.17, Ca 9.2 06/28/12  BMP Na 142, K 3.7, glucose 151, Bun 39, creatinine 1.21, Ca 9.2     Assessment & Plan:   CARCINOMA, BLADDER, TRANSITIONAL CELL No hematuria presently.   Marland Kitchen DIABETES MELLITUS, TYPE II controlled  . HYPERTENSION Controlled.   . CORONARY ARTERY DISEASE No angina  . Anemia Resolved, dc Fe, f/u CBC in one month.   . Memory loss  Functioning adequately in SNF  . Sleep disorder  stable  . Hyposmolality and/or hyponatremia  Resolved, Na 142 06/28/12  . Vitamin B 12 deficiency Continue B12  . Leukocytosis resolved

## 2012-10-05 ENCOUNTER — Non-Acute Institutional Stay (SKILLED_NURSING_FACILITY): Payer: Medicare Other | Admitting: Nurse Practitioner

## 2012-10-05 DIAGNOSIS — I2510 Atherosclerotic heart disease of native coronary artery without angina pectoris: Secondary | ICD-10-CM

## 2012-10-05 DIAGNOSIS — I10 Essential (primary) hypertension: Secondary | ICD-10-CM

## 2012-10-05 DIAGNOSIS — D649 Anemia, unspecified: Secondary | ICD-10-CM

## 2012-10-05 DIAGNOSIS — E119 Type 2 diabetes mellitus without complications: Secondary | ICD-10-CM

## 2012-10-05 DIAGNOSIS — K219 Gastro-esophageal reflux disease without esophagitis: Secondary | ICD-10-CM | POA: Insufficient documentation

## 2012-10-05 DIAGNOSIS — I471 Supraventricular tachycardia: Secondary | ICD-10-CM

## 2012-10-05 NOTE — Assessment & Plan Note (Signed)
Improved, off Fe, takes Vit B12 daily, update CBC

## 2012-10-05 NOTE — Assessment & Plan Note (Signed)
Takes Novolog 8u with lunch 5u with breakfast and dinner  for CBG>150 and Levemir 10units

## 2012-10-05 NOTE — Assessment & Plan Note (Signed)
Risk reduction with ASA and statin.

## 2012-10-05 NOTE — Progress Notes (Signed)
Chief Complaint:  Chief Complaint  Patient presents with  . Medical Managment of Chronic Issues     HPI:   Problem List Items Addressed This Visit     ICD-9-CM   HYPERTENSION     Controlled on Lisinopril 5mg , Lasix 10mg , f/u BMP    CORONARY ARTERY DISEASE     Risk reduction with ASA and statin.     Anemia     Improved, off Fe, takes Vit B12 daily, update CBC    GERD (gastroesophageal reflux disease)   Paroxysmal supraventricular tachycardia   DIABETES MELLITUS, TYPE II - Primary (Chronic)     Takes Novolog 8u with lunch 5u with breakfast and dinner  for CBG>150 and Levemir 10units       Review of Systems:  Review of Systems  Constitutional: Negative for fever, chills, weight loss, malaise/fatigue and diaphoresis.  HENT: Positive for hearing loss. Negative for ear pain, congestion, sore throat and neck pain.   Eyes: Negative for pain, discharge and redness.  Respiratory: Negative for cough, sputum production, shortness of breath and wheezing.   Cardiovascular: Negative for chest pain, palpitations, orthopnea, claudication, leg swelling and PND.  Gastrointestinal: Negative for heartburn, nausea, vomiting, abdominal pain, diarrhea and constipation.  Genitourinary: Positive for frequency. Negative for dysuria, urgency, hematuria and flank pain.  Musculoskeletal: Positive for back pain and joint pain. Negative for myalgias and falls.  Skin: Negative for itching and rash.  Neurological: Negative for dizziness, tingling, tremors, sensory change, speech change, focal weakness, seizures, loss of consciousness, weakness and headaches.  Endo/Heme/Allergies: Negative for environmental allergies and polydipsia. Does not bruise/bleed easily.  Psychiatric/Behavioral: Positive for memory loss. Negative for depression and hallucinations. The patient is not nervous/anxious and does not have insomnia.      Medications: Patient's Medications  New Prescriptions   No medications  on file  Previous Medications   AMIODARONE (PACERONE) 200 MG TABLET    Take 200 mg by mouth daily.     ASPIRIN EC 325 MG TABLET    Take 325 mg by mouth daily.     ATORVASTATIN (LIPITOR) 10 MG TABLET    Take 10 mg by mouth every Monday, Wednesday, and Friday.    BISACODYL (DULCOLAX) 10 MG SUPPOSITORY    Place 10 mg rectally daily as needed. For constipation.    FERROUS SULFATE 325 (65 FE) MG TABLET    Take 325 mg by mouth 2 (two) times daily.     HYDROCHLOROTHIAZIDE (MICROZIDE) 12.5 MG CAPSULE    Take 12.5 mg by mouth daily.     INSULIN DETEMIR (LEVEMIR FLEXPEN) 100 UNIT/ML INJECTION    Inject 8 Units into the skin at bedtime.    INSULIN PEN NEEDLE (NOVOFINE) 32G X 6 MM MISC    as directed.    LISINOPRIL (PRINIVIL,ZESTRIL) 5 MG TABLET    Take 5 mg by mouth daily.   METFORMIN (GLUCOPHAGE) 500 MG TABLET    Take 500 mg by mouth 2 (two) times daily with a meal.   OMEPRAZOLE (PRILOSEC) 20 MG CAPSULE    Take 20 mg by mouth daily.     POLYETHYLENE GLYCOL (MIRALAX / GLYCOLAX) PACKET    Take 17 g by mouth every other day. Hold for constipation.    VITAMIN B-12 (CYANOCOBALAMIN) 1000 MCG TABLET    Take 1,000 mcg by mouth daily.    Modified Medications   No medications on file  Discontinued Medications   No medications on file     Physical Exam: Physical Exam  Constitutional: He is oriented to person, place, and time. He appears well-developed and well-nourished.  HENT:  Head: Normocephalic and atraumatic.  Eyes: Conjunctivae and EOM are normal. Pupils are equal, round, and reactive to light.  Neck: Normal range of motion. Neck supple. No JVD present. No thyromegaly present.  Cardiovascular: Normal rate and normal heart sounds.  An irregular rhythm present.  No murmur heard. Pulmonary/Chest: Effort normal. He has no wheezes. He has rales (bibailar dry rales). He exhibits no tenderness.  Abdominal: Soft. Bowel sounds are normal. There is no tenderness.  Musculoskeletal: Normal range of motion. He  exhibits no edema and no tenderness.  Lymphadenopathy:    He has no cervical adenopathy.  Neurological: He is alert and oriented to person, place, and time. He has normal reflexes. No cranial nerve deficit. He exhibits normal muscle tone. Coordination normal.  Skin: Skin is warm and dry. No rash noted. No erythema.  Psychiatric: He has a normal mood and affect. His behavior is normal. Judgment and thought content normal.     Filed Vitals:   10/05/12 1626  BP: 136/63  Pulse: 71  Temp: 98.2 F (36.8 C)  TempSrc: Tympanic  Resp: 22      Labs reviewed: Basic Metabolic Panel:  Recent Labs  16/10/96 06/28/12  NA  --  142  K  --  3.7  BUN  --  39*  CREATININE  --  1.2  TSH 2.32  --     Liver Function Tests: No results found for this basename: AST, ALT, ALKPHOS, BILITOT, PROT, ALBUMIN,  in the last 8760 hours  CBC:  Recent Labs  06/02/12  WBC 8.1  HGB 12.4*  HCT 37*  PLT 179    Anemia Panel: No results found for this basename: IRON, FOLATE, VITAMINB12,  in the last 8760 hours  Significant Diagnostic Results:     Assessment/Plan DIABETES MELLITUS, TYPE II Takes Novolog 8u with lunch 5u with breakfast and dinner  for CBG>150 and Levemir 10units  HYPERTENSION Controlled on Lisinopril 5mg , Lasix 10mg , f/u BMP  Anemia Improved, off Fe, takes Vit B12 daily, update CBC  CORONARY ARTERY DISEASE Risk reduction with ASA and statin.       Family/ staff Communication: none   Goals of care: SNF   Labs/tests ordered EKG, CBC, CMP, TSH, Hgb A1c.    Patient ID: [name] [name], male   DOB: 1920/10/26, 79 y.o.   MRN: [id number]

## 2012-10-05 NOTE — Assessment & Plan Note (Signed)
Controlled on Lisinopril 5mg , Lasix 10mg , f/u BMP

## 2012-10-11 LAB — CBC AND DIFFERENTIAL
HCT: 32 % — AB (ref 41–53)
Hemoglobin: 10.8 g/dL — AB (ref 13.5–17.5)
Platelets: 201 10*3/uL (ref 150–399)
WBC: 7.9 10^3/mL

## 2012-10-11 LAB — HEPATIC FUNCTION PANEL
AST: 18 U/L (ref 14–40)
Bilirubin, Total: 0.6 mg/dL

## 2012-10-11 LAB — BASIC METABOLIC PANEL: Sodium: 139 mmol/L (ref 137–147)

## 2012-10-11 LAB — HEMOGLOBIN A1C: Hgb A1c MFr Bld: 7.8 % — AB (ref 4.0–6.0)

## 2012-10-11 LAB — TSH: TSH: 2.97 u[IU]/mL (ref 0.41–5.90)

## 2012-10-31 ENCOUNTER — Non-Acute Institutional Stay (SKILLED_NURSING_FACILITY): Payer: Medicare Other | Admitting: Nurse Practitioner

## 2012-10-31 DIAGNOSIS — I471 Supraventricular tachycardia, unspecified: Secondary | ICD-10-CM

## 2012-10-31 DIAGNOSIS — N183 Chronic kidney disease, stage 3 unspecified: Secondary | ICD-10-CM | POA: Insufficient documentation

## 2012-10-31 DIAGNOSIS — I10 Essential (primary) hypertension: Secondary | ICD-10-CM

## 2012-10-31 DIAGNOSIS — E119 Type 2 diabetes mellitus without complications: Secondary | ICD-10-CM

## 2012-10-31 DIAGNOSIS — Z66 Do not resuscitate: Secondary | ICD-10-CM

## 2012-10-31 NOTE — Assessment & Plan Note (Signed)
Rate controlled on Amiodarone 200mg  daily.

## 2012-10-31 NOTE — Assessment & Plan Note (Signed)
Stage II, Crcl is about 55, takes Furosemide 10mg  and Lisinopril 5mg --may have to discontinue both--f/u BMP in 2 weeks.

## 2012-10-31 NOTE — Assessment & Plan Note (Signed)
Controlled on Lisinopril 5mg, Lasix 10mg, f/u BMP 

## 2012-10-31 NOTE — Progress Notes (Signed)
Patient ID: [name] [name], male   DOB: [birth year], 79 y.o.   MRN: [id number]  Chief Complaint:  Chief Complaint  Patient presents with  . Medical Managment of Chronic Issues     HPI:   Problem List Items Addressed This Visit   DIABETES MELLITUS, TYPE II (Chronic)     Takes Novolog 8u with lunch 5u with breakfast and dinner  for CBG>150 and Levemir 10units, Hgb A1c 7.8 10/11/12      Chronic renal disease, stage III (Chronic)     Stage II, Crcl is about 55, takes Furosemide 10mg  and Lisinopril 5mg --may have to discontinue both--f/u BMP in 2 weeks.     HYPERTENSION     Controlled on Lisinopril 5mg , Lasix 10mg , f/u BMP      Paroxysmal supraventricular tachycardia     Rate controlled on Amiodarone 200mg  daily.     DNR (do not resuscitate) - Primary      Review of Systems:  Review of Systems  Constitutional: Negative for fever, chills, weight loss, malaise/fatigue and diaphoresis.  HENT: Positive for hearing loss. Negative for ear pain, congestion, sore throat and neck pain.   Eyes: Negative for pain, discharge and redness.  Respiratory: Negative for cough, sputum production, shortness of breath and wheezing.   Cardiovascular: Negative for chest pain, palpitations, orthopnea, claudication, leg swelling and PND.  Gastrointestinal: Negative for heartburn, nausea, vomiting, abdominal pain, diarrhea and constipation.  Genitourinary: Positive for frequency. Negative for dysuria, urgency, hematuria and flank pain.  Musculoskeletal: Positive for back pain and joint pain. Negative for myalgias and falls.  Skin: Negative for itching and rash.  Neurological: Negative for dizziness, tingling, tremors, sensory change, speech change, focal weakness, seizures, loss of consciousness, weakness and headaches.  Endo/Heme/Allergies: Negative for environmental allergies and polydipsia. Does not bruise/bleed easily.  Psychiatric/Behavioral: Positive for memory loss. Negative for depression and  hallucinations. The patient is not nervous/anxious and does not have insomnia.      Medications: Patient's Medications  New Prescriptions   No medications on file  Previous Medications   AMIODARONE (PACERONE) 200 MG TABLET    Take 200 mg by mouth daily.     ASPIRIN EC 325 MG TABLET    Take 325 mg by mouth daily.     ATORVASTATIN (LIPITOR) 10 MG TABLET    Take 10 mg by mouth every Monday, Wednesday, and Friday.    BISACODYL (DULCOLAX) 10 MG SUPPOSITORY    Place 10 mg rectally daily as needed. For constipation.    FERROUS SULFATE 325 (65 FE) MG TABLET    Take 325 mg by mouth 2 (two) times daily.     HYDROCHLOROTHIAZIDE (MICROZIDE) 12.5 MG CAPSULE    Take 12.5 mg by mouth daily.     INSULIN DETEMIR (LEVEMIR FLEXPEN) 100 UNIT/ML INJECTION    Inject 8 Units into the skin at bedtime.    INSULIN PEN NEEDLE (NOVOFINE) 32G X 6 MM MISC    as directed.    LISINOPRIL (PRINIVIL,ZESTRIL) 5 MG TABLET    Take 5 mg by mouth daily.   METFORMIN (GLUCOPHAGE) 500 MG TABLET    Take 500 mg by mouth 2 (two) times daily with a meal.   OMEPRAZOLE (PRILOSEC) 20 MG CAPSULE    Take 20 mg by mouth daily.     POLYETHYLENE GLYCOL (MIRALAX / GLYCOLAX) PACKET    Take 17 g by mouth every other day. Hold for constipation.    VITAMIN B-12 (CYANOCOBALAMIN) 1000 MCG TABLET    Take 1,000 mcg  by mouth daily.    Modified Medications   No medications on file  Discontinued Medications   No medications on file     Physical Exam: Physical Exam  Constitutional: He is oriented to person, place, and time. He appears well-developed and well-nourished.  HENT:  Head: Normocephalic and atraumatic.  Eyes: Conjunctivae and EOM are normal. Pupils are equal, round, and reactive to light.  Neck: Normal range of motion. Neck supple. No JVD present. No thyromegaly present.  Cardiovascular: Normal rate and normal heart sounds.  An irregular rhythm present.  No murmur heard. Pulmonary/Chest: Effort normal. He has no wheezes. He has rales  (bibailar dry rales). He exhibits no tenderness.  Abdominal: Soft. Bowel sounds are normal. There is no tenderness.  Musculoskeletal: Normal range of motion. He exhibits no edema and no tenderness.  Lymphadenopathy:    He has no cervical adenopathy.  Neurological: He is alert and oriented to person, place, and time. He has normal reflexes. No cranial nerve deficit. He exhibits normal muscle tone. Coordination normal.  Skin: Skin is warm and dry. No rash noted. No erythema.  Psychiatric: He has a normal mood and affect. His behavior is normal. Judgment and thought content normal.     Filed Vitals:   10/31/12 1644  BP: 132/64  Pulse: 80  Temp: 98.2 F (36.8 C)  TempSrc: Tympanic  Resp: 22      Labs reviewed: Basic Metabolic Panel:  Recent Labs  16/10/96 06/28/12 10/11/12  NA  --  142 139  K  --  3.7 4.2  BUN  --  39* 53*  CREATININE  --  1.2 2.0*  TSH 2.32  --  2.97    Liver Function Tests:  Recent Labs  10/11/12  AST 18  ALT 15  ALKPHOS 89    CBC:  Recent Labs  06/02/12 10/11/12  WBC 8.1 7.9  HGB 12.4* 10.8*  HCT 37* 32*  PLT 179 201    Anemia Panel: No results found for this basename: IRON, FOLATE, VITAMINB12,  in the last 8760 hours  Significant Diagnostic Results:     Assessment/Plan DIABETES MELLITUS, TYPE II Takes Novolog 8u with lunch 5u with breakfast and dinner  for CBG>150 and Levemir 10units, Hgb A1c 7.8 10/11/12    Chronic renal disease, stage III Stage II, Crcl is about 55, takes Furosemide 10mg  and Lisinopril 5mg --may have to discontinue both--f/u BMP in 2 weeks.   HYPERTENSION Controlled on Lisinopril 5mg , Lasix 10mg , f/u BMP    Paroxysmal supraventricular tachycardia Rate controlled on Amiodarone 200mg  daily.       Family/ staff Communication: observe the patient  Goals of care: SNF   Labs/tests ordered BMP in 2weeks.

## 2012-10-31 NOTE — Assessment & Plan Note (Addendum)
Takes Novolog 8u with lunch 5u with breakfast and dinner  for CBG>150 and Levemir 10units, Hgb A1c 7.8 10/11/12

## 2012-11-07 ENCOUNTER — Encounter: Payer: Self-pay | Admitting: *Deleted

## 2012-11-11 ENCOUNTER — Encounter (HOSPITAL_COMMUNITY): Payer: Medicare Other

## 2012-11-11 ENCOUNTER — Ambulatory Visit (HOSPITAL_COMMUNITY)
Admission: RE | Admit: 2012-11-11 | Discharge: 2012-11-11 | Disposition: A | Discharge status: Home / Self Care | Payer: Medicare Other | Source: Ambulatory Visit | Attending: Cardiovascular Disease | Admitting: Cardiovascular Disease

## 2012-11-11 DIAGNOSIS — I2584 Coronary atherosclerosis due to calcified coronary lesion: Secondary | ICD-10-CM | POA: Insufficient documentation

## 2012-11-11 NOTE — Progress Notes (Signed)
Abdominal Aortic Duplex Completed. °[name] [name] ° °

## 2012-11-15 LAB — BASIC METABOLIC PANEL
BUN: 55 mg/dL — AB (ref 4–21)
Creatinine: 2 mg/dL — AB (ref 0.6–1.3)
Glucose: 158 mg/dL
Potassium: 4.3 mmol/L (ref 3.4–5.3)

## 2012-11-30 ENCOUNTER — Non-Acute Institutional Stay (SKILLED_NURSING_FACILITY): Payer: Medicare Other | Admitting: Nurse Practitioner

## 2012-11-30 ENCOUNTER — Encounter: Payer: Self-pay | Admitting: Nurse Practitioner

## 2012-11-30 DIAGNOSIS — I10 Essential (primary) hypertension: Secondary | ICD-10-CM

## 2012-11-30 DIAGNOSIS — N183 Chronic kidney disease, stage 3 unspecified: Secondary | ICD-10-CM

## 2012-11-30 DIAGNOSIS — I471 Supraventricular tachycardia, unspecified: Secondary | ICD-10-CM

## 2012-11-30 DIAGNOSIS — E119 Type 2 diabetes mellitus without complications: Secondary | ICD-10-CM

## 2012-11-30 NOTE — Assessment & Plan Note (Addendum)
worse, Crcl is about 55, creat was 1.96 5/13--2.04 11/12/12 takes Furosemide 10mg  and Lisinopril 5mg -dc Furosemide and Kcl, f/u BMP in 2weeks, Bp daily.

## 2012-11-30 NOTE — Assessment & Plan Note (Signed)
Rate controlled on Amiodarone 200mg daily.  

## 2012-11-30 NOTE — Progress Notes (Signed)
Patient ID: [name] [name], male   DOB: 1921-01-03, 79 y.o.   MRN: [id number]  Chief Complaint:  Chief Complaint  Patient presents with  . Medical Managment of Chronic Issues    CRD, Bp     HPI:   Problem List Items Addressed This Visit   Chronic renal disease, stage III - Primary (Chronic)     worse, Crcl is about 55, creat was 1.96 5/13--2.04 11/12/12 takes Furosemide 10mg  and Lisinopril 5mg -dc Furosemide and Kcl, f/u BMP in 2weeks, Bp daily.       DIABETES MELLITUS, TYPE II (Chronic)     Takes Novolog 8u with lunch 5u with breakfast and dinner  for CBG>150 and Levemir 10units, Hgb A1c 7.8 10/11/12        HYPERTENSION     Controlled, continue Lisinopril 5mg , dc Lasix(due to creat >2 now). Monitor Bp daily.         Paroxysmal supraventricular tachycardia     Rate controlled on Amiodarone 200mg  daily.          Review of Systems:  Review of Systems  Constitutional: Negative for fever, chills, weight loss, malaise/fatigue and diaphoresis.  HENT: Positive for hearing loss. Negative for ear pain, congestion, sore throat and neck pain.   Eyes: Negative for pain, discharge and redness.  Respiratory: Negative for cough, sputum production, shortness of breath and wheezing.   Cardiovascular: Negative for chest pain, palpitations, orthopnea, claudication, leg swelling and PND.  Gastrointestinal: Negative for heartburn, nausea, vomiting, abdominal pain, diarrhea and constipation.  Genitourinary: Positive for frequency. Negative for dysuria, urgency, hematuria and flank pain.  Musculoskeletal: Positive for back pain and joint pain. Negative for myalgias and falls.  Skin: Negative for itching and rash.  Neurological: Negative for dizziness, tingling, tremors, sensory change, speech change, focal weakness, seizures, loss of consciousness, weakness and headaches.  Endo/Heme/Allergies: Negative for environmental allergies and polydipsia. Does not bruise/bleed easily.   Psychiatric/Behavioral: Positive for memory loss. Negative for depression and hallucinations. The patient is not nervous/anxious and does not have insomnia.      Medications: Patient's Medications  New Prescriptions   No medications on file  Previous Medications   AMIODARONE (PACERONE) 200 MG TABLET    Take 200 mg by mouth daily. Take one tablet daily for heart.   ASPIRIN EC 325 MG TABLET    Take 325 mg by mouth daily.     ATORVASTATIN (LIPITOR) 10 MG TABLET    Take 10 mg by mouth every Monday, Wednesday, and Friday.    BISACODYL (DULCOLAX) 10 MG SUPPOSITORY    Place 10 mg rectally daily as needed. For constipation.    FERROUS SULFATE 325 (65 FE) MG TABLET    Take 325 mg by mouth 2 (two) times daily. Take 1 tablet twice daily for iron supplement   HYDROCHLOROTHIAZIDE (MICROZIDE) 12.5 MG CAPSULE    Take 12.5 mg by mouth daily.     INSULIN DETEMIR (LEVEMIR FLEXPEN) 100 UNIT/ML INJECTION    Inject 8 Units into the skin at bedtime.    INSULIN PEN NEEDLE (NOVOFINE) 32G X 6 MM MISC    as directed.    LISINOPRIL (PRINIVIL,ZESTRIL) 5 MG TABLET    Take 5 mg by mouth daily.   METFORMIN (GLUCOPHAGE) 500 MG TABLET    Take 500 mg by mouth 2 (two) times daily with a meal.   MIRTAZAPINE (REMERON) 15 MG TABLET    Take 15 mg by mouth at bedtime. Take 1 nightly to help nerves and to stimulate appetite.  NUTRITIONAL SUPPLEMENTS (BOOST DIABETIC) LIQD    Take by mouth. Drink 1 can daily.   OMEPRAZOLE (PRILOSEC) 20 MG CAPSULE    Take 20 mg by mouth daily.     POLYETHYLENE GLYCOL (MIRALAX / GLYCOLAX) PACKET    Take 17 g by mouth every other day. Hold for constipation.    RAMIPRIL (ALTACE) 2.5 MG TABLET    Take 2.5 mg by mouth daily.   VITAMIN B-12 (CYANOCOBALAMIN) 1000 MCG TABLET    Take 1,000 mcg by mouth daily.    Modified Medications   No medications on file  Discontinued Medications   No medications on file     Physical Exam: Physical Exam  Constitutional: He is oriented to person, place, and time.  He appears well-developed and well-nourished.  HENT:  Head: Normocephalic and atraumatic.  Eyes: Conjunctivae and EOM are normal. Pupils are equal, round, and reactive to light.  Neck: Normal range of motion. Neck supple. No JVD present. No thyromegaly present.  Cardiovascular: Normal rate and normal heart sounds.  An irregular rhythm present.  No murmur heard. Pulmonary/Chest: Effort normal. He has no wheezes. He has rales (bibailar dry rales). He exhibits no tenderness.  Abdominal: Soft. Bowel sounds are normal. There is no tenderness.  Musculoskeletal: Normal range of motion. He exhibits no edema and no tenderness.  Lymphadenopathy:    He has no cervical adenopathy.  Neurological: He is alert and oriented to person, place, and time. He has normal reflexes. No cranial nerve deficit. He exhibits normal muscle tone. Coordination normal.  Skin: Skin is warm and dry. No rash noted. No erythema.  Psychiatric: He has a normal mood and affect. His behavior is normal. Judgment and thought content normal.     Filed Vitals:   11/30/12 1439  BP: 110/60  Pulse: 60  Resp: 16      Labs reviewed: Basic Metabolic Panel:  Recent Labs  16/10/96 06/28/12 10/11/12 11/15/12  NA  --  142 139 135*  K  --  3.7 4.2 4.3  BUN  --  39* 53* 55*  CREATININE  --  1.2 2.0* 2.0*  TSH 2.32  --  2.97  --     Liver Function Tests:  Recent Labs  10/11/12  AST 18  ALT 15  ALKPHOS 89    CBC:  Recent Labs  06/02/12 10/11/12  WBC 8.1 7.9  HGB 12.4* 10.8*  HCT 37* 32*  PLT 179 201    Anemia Panel: No results found for this basename: IRON, FOLATE, VITAMINB12,  in the last 8760 hours  Significant Diagnostic Results:     Assessment/Plan Chronic renal disease, stage III worse, Crcl is about 55, creat was 1.96 5/13--2.04 11/12/12 takes Furosemide 10mg  and Lisinopril 5mg -dc Furosemide and Kcl, f/u BMP in 2weeks, Bp daily.     DIABETES MELLITUS, TYPE II Takes Novolog 8u with lunch 5u with  breakfast and dinner  for CBG>150 and Levemir 10units, Hgb A1c 7.8 10/11/12      HYPERTENSION Controlled, continue Lisinopril 5mg , dc Lasix(due to creat >2 now). Monitor Bp daily.       Paroxysmal supraventricular tachycardia Rate controlled on Amiodarone 200mg  daily.         Family/ staff Communication: observe the patient, monitor Bp, s/s of developing CHF/edema   Goals of care: SNF   Labs/tests ordered BMP in 2 weeks.

## 2012-11-30 NOTE — Assessment & Plan Note (Signed)
Takes Novolog 8u with lunch 5u with breakfast and dinner  for CBG>150 and Levemir 10units, Hgb A1c 7.8 10/11/12

## 2012-11-30 NOTE — Assessment & Plan Note (Addendum)
Controlled, continue Lisinopril 5mg , dc Lasix(due to creat >2 now). Monitor Bp daily.

## 2012-12-08 ENCOUNTER — Telehealth: Payer: Self-pay | Admitting: Cardiovascular Disease

## 2012-12-08 NOTE — Telephone Encounter (Signed)
Left message to call back and schedule appt

## 2012-12-15 LAB — BASIC METABOLIC PANEL: Sodium: 138 mmol/L (ref 137–147)

## 2013-01-02 ENCOUNTER — Non-Acute Institutional Stay (SKILLED_NURSING_FACILITY): Payer: Medicare Other | Admitting: Nurse Practitioner

## 2013-01-02 ENCOUNTER — Encounter: Payer: Self-pay | Admitting: Nurse Practitioner

## 2013-01-02 DIAGNOSIS — I2510 Atherosclerotic heart disease of native coronary artery without angina pectoris: Secondary | ICD-10-CM

## 2013-01-02 DIAGNOSIS — K219 Gastro-esophageal reflux disease without esophagitis: Secondary | ICD-10-CM

## 2013-01-02 DIAGNOSIS — I10 Essential (primary) hypertension: Secondary | ICD-10-CM

## 2013-01-02 DIAGNOSIS — E119 Type 2 diabetes mellitus without complications: Secondary | ICD-10-CM

## 2013-01-02 DIAGNOSIS — I471 Supraventricular tachycardia: Secondary | ICD-10-CM

## 2013-01-02 NOTE — Assessment & Plan Note (Signed)
Risk reduction with ASA and statin.

## 2013-01-02 NOTE — Assessment & Plan Note (Signed)
Controlled, will dc Lisinopril 5mg  2nd to creat 2.14 12/15/12, dc'd Lasix(due to creat >2 now). Start Metoprolol 12.5mg  bid and Monitor Bp. F/u BMP in one week.

## 2013-01-02 NOTE — Progress Notes (Signed)
Patient ID: [name] [name], male   DOB: [birth year], 79 y.o.  Code Status: DNR  No Known Allergies  Chief Complaint  Patient presents with  . Medical Managment of Chronic Issues    HPI: Patient is a 79 y.o. male seen in the SNF at [facility] today for evaluation of CKD and other chronic medical conditions.  Problem List Items Addressed This Visit   Chronic renal disease, stage III (Chronic)     Creat 2.14 12/15/12--dc'd Furosemide didn't help. Will dc Lisinopril, f/u BMP    CORONARY ARTERY DISEASE     Risk reduction with ASA and statin.       DIABETES MELLITUS, TYPE II - Primary (Chronic)     Takes Novolog 8u with lunch 5u with breakfast and dinner  for CBG>150 and Levemir 10units, Hgb A1c 7.8 10/11/12          GERD (gastroesophageal reflux disease)     Stable on Omeprazole 20mg  bid.     HYPERTENSION     Controlled, will dc Lisinopril 5mg  2nd to creat 2.14 12/15/12, dc'd Lasix(due to creat >2 now). Start Metoprolol 12.5mg  bid and Monitor Bp. F/u BMP in one week.           Paroxysmal supraventricular tachycardia     Rate controlled on Amiodarone 200mg  daily.            Review of Systems:  Review of Systems  Constitutional: Negative for fever, chills, weight loss, malaise/fatigue and diaphoresis.  HENT: Positive for hearing loss. Negative for ear pain, congestion, sore throat and neck pain.   Eyes: Negative for pain, discharge and redness.  Respiratory: Negative for cough, sputum production, shortness of breath and wheezing.   Cardiovascular: Negative for chest pain, palpitations, orthopnea, claudication, leg swelling and PND.  Gastrointestinal: Negative for heartburn, nausea, vomiting, abdominal pain, diarrhea and constipation.  Genitourinary: Positive for frequency. Negative for dysuria, urgency, hematuria and flank pain.  Musculoskeletal: Positive for back pain and joint pain. Negative for myalgias and falls.  Skin: Negative for itching and rash.   Neurological: Negative for dizziness, tingling, tremors, sensory change, speech change, focal weakness, seizures, loss of consciousness, weakness and headaches.  Endo/Heme/Allergies: Negative for environmental allergies and polydipsia. Does not bruise/bleed easily.  Psychiatric/Behavioral: Positive for memory loss. Negative for depression and hallucinations. The patient is not nervous/anxious and does not have insomnia.      Past Medical History  Diagnosis Date  . CVA (cerebral infarction) 1981    MI  . Type II or unspecified type diabetes mellitus without mention of complication, not stated as uncontrolled   . Diverticulosis   . History of GI diverticular bleed 2007  . HTN (hypertension)   . AAA (abdominal aortic aneurysm)     CT 2006 3.2 cm  . Bladder cancer     Transitional cell carcinoma excised 2006  . Renal malignant tumor     transitional cell carcinoma lasered 2006  . Hyperlipidemia   . Hydrocele, left   . Inguinal hernia   . Coronary artery disease 05/05/2011    Echo EF 50%- 55% mild inferiof wall hypokinesis,mild dilatation left artium,mild mitral annular calcif. mild MR, mild TR, and mild calcif. /sclerosis trileaflet aortic valve w/ trace aortic insufficiency  . Dysrhythmia     af  . Anemia   . GERD (gastroesophageal reflux disease)   . Arthritis   . Myocardial infarction 1981    inferior-wall MI   . Atrial fibrillation 07/04/2010    cardioversion -  successful  normal sinus rhythm; May 2012 discontinue Coumadin  and increaseAspirin  . Peripheral vascular disease 11/13/2010    Abd aortic doppler mild abd aortic aneurysm 3.1 x 2.9cm  . Coronary artery disease 10/01/2009    Persantaine myview-EF 53% moderate perfusion d/t infaract/scar w/mild perinfaract ischemia seen Mid Anterior ,Mid Inferior, Apical Inferior , Basal and Mid inferolateral, Apical Lateral region  . Hypopotassemia 06/22/2012  . Edema 06/22/2012  . Congestive heart failure, unspecified 06/13/2012  .  Olecranon bursitis 02/03/2012  . Pneumonia, organism unspecified 01/20/2012  . Neoplasm of uncertain behavior of skin 10/19/2011  . Unspecified venous (peripheral) insufficiency 08/26/2011  . Unspecified constipation 07/29/2011  . Lumbago 07/09/2011  . Personal history of fall 06/25/2011  . Unspecified malignant neoplasm of scalp and skin of neck 06/11/2011  . Other acquired deformity of ankle and foot(736.79) 06/11/2011  . Other alteration of consciousness 06/11/2011  . Hyposmolality and/or hyponatremia 05/07/2011  . Major depressive disorder, single episode, unspecified 05/07/2011  . Abnormality of gait 04/27/2011  . Other B-complex deficiencies 04/09/2011  . Senile dementia, uncomplicated 04/09/2011  . Phlebitis and thrombophlebitis of other deep vessels of lower extremities 04/09/2011  . Hemorrhage of rectum and anus 04/09/2011  . Loss of weight 04/09/2011  . Anemia, unspecified 9/8/209  . Malignant neoplasm of bladder, part unspecified 04/08/2008  . Malignant neoplasm of kidney, except pelvis 04/08/2005   Past Surgical History  Procedure Laterality Date  . Coronary artery bypass graft  '94    LIMA-LAD, SVG-OM, PD, RCA  . Cataract extraction  2007  . Renal laser ablation of tumor  2006  . Coronary artery bypass graft  1994    LIMA to LAD, vein to the marginal, vein to the PDA, vein to the PLA   . Appendectomy    . Eye surgery    . Incision and drainage of wound  09/10/2011    Procedure: IRRIGATION AND DEBRIDEMENT WOUND;  Surgeon: Wayland Denis, DO;  Location: [facility];  Service: Plastics;  Laterality: N/A;  irrigation and debridement scalp wound with removal of bone and placement of intregra or acell and vac   Social History:   reports that he has quit smoking. He does not have any smokeless tobacco history on file. He reports that he does not drink alcohol or use illicit drugs.  Family History  Problem Relation Age of Onset  . Diabetes Mother   . Diabetes  Daughter     Medications: Patient's Medications  New Prescriptions   No medications on file  Previous Medications   AMIODARONE (PACERONE) 200 MG TABLET    Take 200 mg by mouth daily. Take one tablet daily for heart.   ASPIRIN EC 325 MG TABLET    Take 325 mg by mouth daily.     ATORVASTATIN (LIPITOR) 10 MG TABLET    Take 10 mg by mouth every Monday, Wednesday, and Friday.    BISACODYL (DULCOLAX) 10 MG SUPPOSITORY    Place 10 mg rectally daily as needed. For constipation.    FERROUS SULFATE 325 (65 FE) MG TABLET    Take 325 mg by mouth 2 (two) times daily. Take 1 tablet twice daily for iron supplement   HYDROCHLOROTHIAZIDE (MICROZIDE) 12.5 MG CAPSULE    Take 12.5 mg by mouth daily.     INSULIN DETEMIR (LEVEMIR FLEXPEN) 100 UNIT/ML INJECTION    Inject 8 Units into the skin at bedtime.    INSULIN PEN NEEDLE (NOVOFINE) 32G X 6 MM MISC  as directed.    LISINOPRIL (PRINIVIL,ZESTRIL) 5 MG TABLET    Take 5 mg by mouth daily.   METFORMIN (GLUCOPHAGE) 500 MG TABLET    Take 500 mg by mouth 2 (two) times daily with a meal.   MIRTAZAPINE (REMERON) 15 MG TABLET    Take 15 mg by mouth at bedtime. Take 1 nightly to help nerves and to stimulate appetite.   NUTRITIONAL SUPPLEMENTS (BOOST DIABETIC) LIQD    Take by mouth. Drink 1 can daily.   OMEPRAZOLE (PRILOSEC) 20 MG CAPSULE    Take 20 mg by mouth daily.     POLYETHYLENE GLYCOL (MIRALAX / GLYCOLAX) PACKET    Take 17 g by mouth every other day. Hold for constipation.    RAMIPRIL (ALTACE) 2.5 MG TABLET    Take 2.5 mg by mouth daily.   VITAMIN B-12 (CYANOCOBALAMIN) 1000 MCG TABLET    Take 1,000 mcg by mouth daily.    Modified Medications   No medications on file  Discontinued Medications   No medications on file     Physical Exam:Physical Exam  Constitutional: He is oriented to person, place, and time. He appears well-developed and well-nourished.  HENT:  Head: Normocephalic and atraumatic.  Eyes: Conjunctivae and EOM are normal. Pupils are equal,  round, and reactive to light.  Neck: Normal range of motion. Neck supple. No JVD present. No thyromegaly present.  Cardiovascular: Normal rate and normal heart sounds.  An irregular rhythm present.  No murmur heard. Pulmonary/Chest: Effort normal. He has no wheezes. He has rales (bibailar dry rales). He exhibits no tenderness.  Abdominal: Soft. Bowel sounds are normal. There is no tenderness.  Musculoskeletal: Normal range of motion. He exhibits no edema and no tenderness.  Lymphadenopathy:    He has no cervical adenopathy.  Neurological: He is alert and oriented to person, place, and time. He has normal reflexes. No cranial nerve deficit. He exhibits normal muscle tone. Coordination normal.  Skin: Skin is warm and dry. No rash noted. No erythema.  Psychiatric: He has a normal mood and affect. His behavior is normal. Judgment and thought content normal.    Filed Vitals:   01/02/13 1238  BP: 140/82  Pulse: 76  Temp: 98.2 F (36.8 C)  TempSrc: Tympanic  Resp: 18      Labs reviewed: Basic Metabolic Panel:  Recent Labs  03/27/24 06/28/12 10/11/12 11/15/12  NA  --  142 139 135*  K  --  3.7 4.2 4.3  BUN  --  39* 53* 55*  CREATININE  --  1.2 2.0* 2.0*  TSH 2.32  --  2.97  --    Liver Function Tests:  Recent Labs  10/11/12  AST 18  ALT 15  ALKPHOS 89   No results found for this basename: LIPASE, AMYLASE,  in the last 8760 hours No results found for this basename: AMMONIA,  in the last 8760 hours CBC:  Recent Labs  06/02/12 10/11/12  WBC 8.1 7.9  HGB 12.4* 10.8*  HCT 37* 32*  PLT 179 201   Lipid Panel: No results found for this basename: CHOL, HDL, LDLCALC, TRIG, CHOLHDL, LDLDIRECT,  in the last 8760 hours Anemia Panel: No results found for this basename: FOLATE, IRON, VITAMINB12,  in the last 8760 hours  Past Procedures:     Assessment/Plan DIABETES MELLITUS, TYPE II Takes Novolog 8u with lunch 5u with breakfast and dinner  for CBG>150 and Levemir 10units,  Hgb A1c 7.8 10/11/12  CORONARY ARTERY DISEASE Risk reduction with ASA and statin.     HYPERTENSION Controlled, will dc Lisinopril 5mg  2nd to creat 2.14 12/15/12, dc'd Lasix(due to creat >2 now). Start Metoprolol 12.5mg  bid and Monitor Bp. F/u BMP in one week.         Chronic renal disease, stage III Creat 2.14 12/15/12--dc'd Furosemide didn't help. Will dc Lisinopril, f/u BMP  Paroxysmal supraventricular tachycardia Rate controlled on Amiodarone 200mg  daily.       GERD (gastroesophageal reflux disease) Stable on Omeprazole 20mg  bid.     Family/ Staff Communication: none  Goals of Care: SNF  Labs/tests ordered: BMP in one week

## 2013-01-02 NOTE — Assessment & Plan Note (Signed)
Rate controlled on Amiodarone 200mg  daily.

## 2013-01-02 NOTE — Assessment & Plan Note (Signed)
Stable on Omeprazole 20mg  bid.

## 2013-01-02 NOTE — Assessment & Plan Note (Signed)
Takes Novolog 8u with lunch 5u with breakfast and dinner  for CBG>150 and Levemir 10units, Hgb A1c 7.8 10/11/12     

## 2013-01-02 NOTE — Assessment & Plan Note (Signed)
Creat 2.14 12/15/12--dc'd Furosemide didn't help. Will dc Lisinopril, f/u BMP

## 2013-01-10 LAB — BASIC METABOLIC PANEL
BUN: 58 mg/dL — AB (ref 4–21)
Creatinine: 2.2 mg/dL — AB (ref 0.6–1.3)
Glucose: 161 mg/dL
Potassium: 5.1 mmol/L (ref 3.4–5.3)
Sodium: 137 mmol/L (ref 137–147)

## 2013-01-20 DIAGNOSIS — Z299 Encounter for prophylactic measures, unspecified: Secondary | ICD-10-CM

## 2013-02-02 ENCOUNTER — Encounter: Payer: Self-pay | Admitting: Nurse Practitioner

## 2013-02-02 ENCOUNTER — Non-Acute Institutional Stay (SKILLED_NURSING_FACILITY): Payer: Medicare Other | Admitting: Nurse Practitioner

## 2013-02-02 DIAGNOSIS — I10 Essential (primary) hypertension: Secondary | ICD-10-CM

## 2013-02-02 DIAGNOSIS — I471 Supraventricular tachycardia: Secondary | ICD-10-CM

## 2013-02-02 DIAGNOSIS — E119 Type 2 diabetes mellitus without complications: Secondary | ICD-10-CM

## 2013-02-02 DIAGNOSIS — R413 Other amnesia: Secondary | ICD-10-CM

## 2013-02-02 NOTE — Assessment & Plan Note (Signed)
Creat 2.14 12/15/12-off Lisinopril/Furosemide--creat 2.19 01/10/13

## 2013-02-02 NOTE — Progress Notes (Signed)
Patient ID: [name] [name], male   DOB: 1920/12/26, 79 y.o.   MRN: [id number]  Code Status: DNR  Allergies  Allergen Reactions  . Celebrex [Celecoxib]     Chief Complaint  Patient presents with  . Medical Managment of Chronic Issues    confusion, fatigue, agitation.     HPI: Patient is a 79 y.o. male seen in the SNF at [facility] today for evaluation of confusion, fatigue, agitation,  CKD and other chronic medical conditions.  Problem List Items Addressed This Visit   Chronic renal disease, stage III (Chronic)     Creat 2.14 12/15/12-off Lisinopril/Furosemide--creat 2.19 01/10/13      DIABETES MELLITUS, TYPE II (Chronic)     Stable on Omeprazole 20mg  bid.       HYPERTENSION     Controlled, off Lisinopril 5mg  2nd to creat 2.14 12/15/12-2.19 01/10/13, off  Lasix(due to creat >2 now). Controlled on  Metoprolol 12.5mg  bid and Monitor Bp.             Memory loss - Primary     Mild abnormality on brief-MMSE. Family very aware of changes. Increased confusion/agitation/fatigue for the past 2 days--UA pending. Obtain CBC and BMP in am.      Paroxysmal supraventricular tachycardia     HR in 50s,   on Amiodarone 200mg  daily. EKG to evaluate.              Review of Systems:  Review of Systems  Constitutional: Negative for fever, chills, weight loss, malaise/fatigue and diaphoresis.  HENT: Positive for hearing loss. Negative for ear pain, congestion, sore throat and neck pain.   Eyes: Negative for pain, discharge and redness.  Respiratory: Negative for cough, sputum production, shortness of breath and wheezing.   Cardiovascular: Negative for chest pain, palpitations, orthopnea, claudication, leg swelling and PND.  Gastrointestinal: Negative for heartburn, nausea, vomiting, abdominal pain, diarrhea and constipation.  Genitourinary: Positive for frequency. Negative for dysuria, urgency, hematuria and flank pain.  Musculoskeletal: Positive for back pain and joint  pain. Negative for myalgias and falls.  Skin: Negative for itching and rash.  Neurological: Negative for dizziness, tingling, tremors, sensory change, speech change, focal weakness, seizures, loss of consciousness, weakness and headaches.  Endo/Heme/Allergies: Negative for environmental allergies and polydipsia. Does not bruise/bleed easily.  Psychiatric/Behavioral: Positive for memory loss. Negative for depression and hallucinations. The patient is not nervous/anxious and does not have insomnia.      Past Medical History  Diagnosis Date  . CVA (cerebral infarction) 1981    MI  . Type II or unspecified type diabetes mellitus without mention of complication, not stated as uncontrolled   . Diverticulosis   . History of GI diverticular bleed 2007  . HTN (hypertension)   . AAA (abdominal aortic aneurysm)     CT 2006 3.2 cm  . Bladder cancer     Transitional cell carcinoma excised 2006  . Renal malignant tumor     transitional cell carcinoma lasered 2006  . Hyperlipidemia   . Hydrocele, left   . Inguinal hernia   . Coronary artery disease 05/05/2011    Echo EF 50%- 55% mild inferiof wall hypokinesis,mild dilatation left artium,mild mitral annular calcif. mild MR, mild TR, and mild calcif. /sclerosis trileaflet aortic valve w/ trace aortic insufficiency  . Dysrhythmia     af  . Anemia   . GERD (gastroesophageal reflux disease)   . Arthritis   . Myocardial infarction 1981    inferior-wall MI   .  Atrial fibrillation 07/04/2010    cardioversion - successful  normal sinus rhythm; May 2012 discontinue Coumadin  and increaseAspirin  . Peripheral vascular disease 11/13/2010    Abd aortic doppler mild abd aortic aneurysm 3.1 x 2.9cm  . Coronary artery disease 10/01/2009    Persantaine myview-EF 53% moderate perfusion d/t infaract/scar w/mild perinfaract ischemia seen Mid Anterior ,Mid Inferior, Apical Inferior , Basal and Mid inferolateral, Apical Lateral region  . Hypopotassemia 06/22/2012   . Edema 06/22/2012  . Congestive heart failure, unspecified 06/13/2012  . Olecranon bursitis 02/03/2012  . Pneumonia, organism unspecified 01/20/2012  . Neoplasm of uncertain behavior of skin 10/19/2011  . Unspecified venous (peripheral) insufficiency 08/26/2011  . Unspecified constipation 07/29/2011  . Lumbago 07/09/2011  . Personal history of fall 06/25/2011  . Unspecified malignant neoplasm of scalp and skin of neck 06/11/2011  . Other acquired deformity of ankle and foot(736.79) 06/11/2011  . Other alteration of consciousness 06/11/2011  . Hyposmolality and/or hyponatremia 05/07/2011  . Major depressive disorder, single episode, unspecified 05/07/2011  . Abnormality of gait 04/27/2011  . Other B-complex deficiencies 04/09/2011  . Senile dementia, uncomplicated 04/09/2011  . Phlebitis and thrombophlebitis of other deep vessels of lower extremities 04/09/2011  . Hemorrhage of rectum and anus 04/09/2011  . Loss of weight 04/09/2011  . Anemia, unspecified 9/8/209  . Malignant neoplasm of bladder, part unspecified 04/08/2008  . Malignant neoplasm of kidney, except pelvis 04/08/2005   Past Surgical History  Procedure Laterality Date  . Coronary artery bypass graft  '94    LIMA-LAD, SVG-OM, PD, RCA  . Cataract extraction  2007  . Renal laser ablation of tumor  2006  . Coronary artery bypass graft  1994    LIMA to LAD, vein to the marginal, vein to the PDA, vein to the PLA   . Appendectomy    . Eye surgery    . Incision and drainage of wound  09/10/2011    Procedure: IRRIGATION AND DEBRIDEMENT WOUND;  Surgeon: Wayland Denis, DO;  Location: [facility];  Service: Plastics;  Laterality: N/A;  irrigation and debridement scalp wound with removal of bone and placement of intregra or acell and vac   Social History:   reports that he has quit smoking. He does not have any smokeless tobacco history on file. He reports that he does not drink alcohol or use illicit drugs.  Family  History  Problem Relation Age of Onset  . Diabetes Mother   . Diabetes Daughter     Medications: Patient's Medications  New Prescriptions   No medications on file  Previous Medications   AMIODARONE (PACERONE) 200 MG TABLET    Take 200 mg by mouth daily. Take one tablet daily for heart.   ASPIRIN EC 325 MG TABLET    Take 325 mg by mouth daily.     ATORVASTATIN (LIPITOR) 10 MG TABLET    Take 10 mg by mouth every Monday, Wednesday, and Friday.    BISACODYL (DULCOLAX) 10 MG SUPPOSITORY    Place 10 mg rectally daily as needed. For constipation.    FERROUS SULFATE 325 (65 FE) MG TABLET    Take 325 mg by mouth 2 (two) times daily. Take 1 tablet twice daily for iron supplement   HYDROCHLOROTHIAZIDE (MICROZIDE) 12.5 MG CAPSULE    Take 12.5 mg by mouth daily.     INSULIN DETEMIR (LEVEMIR FLEXPEN) 100 UNIT/ML INJECTION    Inject 8 Units into the skin at bedtime.    INSULIN PEN NEEDLE (NOVOFINE)  32G X 6 MM MISC    as directed.    LISINOPRIL (PRINIVIL,ZESTRIL) 5 MG TABLET    Take 5 mg by mouth daily.   METFORMIN (GLUCOPHAGE) 500 MG TABLET    Take 500 mg by mouth 2 (two) times daily with a meal.   MIRTAZAPINE (REMERON) 15 MG TABLET    Take 15 mg by mouth at bedtime. Take 1 nightly to help nerves and to stimulate appetite.   NUTRITIONAL SUPPLEMENTS (BOOST DIABETIC) LIQD    Take by mouth. Drink 1 can daily.   OMEPRAZOLE (PRILOSEC) 20 MG CAPSULE    Take 20 mg by mouth daily.     POLYETHYLENE GLYCOL (MIRALAX / GLYCOLAX) PACKET    Take 17 g by mouth every other day. Hold for constipation.    RAMIPRIL (ALTACE) 2.5 MG TABLET    Take 2.5 mg by mouth daily.   VITAMIN B-12 (CYANOCOBALAMIN) 1000 MCG TABLET    Take 1,000 mcg by mouth daily.    Modified Medications   No medications on file  Discontinued Medications   No medications on file     Physical Exam:Physical Exam  Constitutional: He is oriented to person, place, and time. He appears well-developed and well-nourished.  HENT:  Head: Normocephalic and  atraumatic.  Eyes: Conjunctivae and EOM are normal. Pupils are equal, round, and reactive to light.  Neck: Normal range of motion. Neck supple. No JVD present. No thyromegaly present.  Cardiovascular: Normal rate.  An irregular rhythm present.  Murmur heard.  Systolic murmur is present with a grade of 2/6  Pulmonary/Chest: Effort normal. He has no wheezes. He has rales (bibailar dry rales). He exhibits no tenderness.  Abdominal: Soft. Bowel sounds are normal. There is no tenderness.  Musculoskeletal: Normal range of motion. He exhibits no edema and no tenderness.  Lymphadenopathy:    He has no cervical adenopathy.  Neurological: He is alert and oriented to person, place, and time. He has normal reflexes. No cranial nerve deficit. He exhibits normal muscle tone. Coordination normal.  Skin: Skin is warm and dry. No rash noted. No erythema.  Psychiatric: He has a normal mood and affect. His behavior is normal. Judgment and thought content normal.    Filed Vitals:   02/02/13 1235  BP: 144/54  Pulse: 60  Temp: 97.6 F (36.4 C)  TempSrc: Tympanic  Resp: 16      Labs reviewed: Basic Metabolic Panel:  Recent Labs  16/10/96  10/11/12 11/15/12 12/15/12 01/10/13  NA  --   < > 139 135* 138 137  K  --   < > 4.2 4.3 4.4 5.1  BUN  --   < > 53* 55* 53* 58*  CREATININE  --   < > 2.0* 2.0* 2.1* 2.2*  TSH 2.32  --  2.97  --   --   --   < > = values in this interval not displayed. Liver Function Tests:  Recent Labs  10/11/12  AST 18  ALT 15  ALKPHOS 89   CBC:  Recent Labs  06/02/12 10/11/12  WBC 8.1 7.9  HGB 12.4* 10.8*  HCT 37* 32*  PLT 179 201   Past Procedures: 01/14/12 CXR pattern of mild pneumonitis throughout the right lund.   Assessment/Plan Memory loss Mild abnormality on brief-MMSE. Family very aware of changes. Increased confusion/agitation/fatigue for the past 2 days--UA pending. Obtain CBC and BMP in am.    HYPERTENSION Controlled, off Lisinopril 5mg  2nd to  creat 2.14 12/15/12-2.19 01/10/13, off  Lasix(due  to creat >2 now). Controlled on  Metoprolol 12.5mg  bid and Monitor Bp.           Paroxysmal supraventricular tachycardia HR in 50s,   on Amiodarone 200mg  daily. EKG to evaluate.         DIABETES MELLITUS, TYPE II Stable on Omeprazole 20mg  bid.     Chronic renal disease, stage III Creat 2.14 12/15/12-off Lisinopril/Furosemide--creat 2.19 01/10/13      Family/ Staff Communication: none  Goals of Care: SNF  Labs/tests ordered: BMP and CBC in am.

## 2013-02-02 NOTE — Assessment & Plan Note (Addendum)
HR in 50s,   on Amiodarone 200mg  daily. EKG to evaluate.

## 2013-02-02 NOTE — Assessment & Plan Note (Addendum)
Controlled, off Lisinopril 5mg  2nd to creat 2.14 12/15/12-2.19 01/10/13, off  Lasix(due to creat >2 now). Controlled on  Metoprolol 12.5mg  bid and Monitor Bp.

## 2013-02-02 NOTE — Assessment & Plan Note (Addendum)
Mild abnormality on brief-MMSE. Family very aware of changes. Increased confusion/agitation/fatigue for the past 2 days--UA pending. Obtain CBC and BMP in am.

## 2013-02-02 NOTE — Assessment & Plan Note (Signed)
Stable on Omeprazole 20mg bid.  

## 2013-02-04 LAB — BASIC METABOLIC PANEL
BUN: 43 mg/dL — AB (ref 4–21)
Creatinine: 2.2 mg/dL — AB (ref 0.6–1.3)
Glucose: 75 mg/dL
Potassium: 4.5 mmol/L (ref 3.4–5.3)
Sodium: 139 mmol/L (ref 137–147)

## 2013-02-04 LAB — CBC AND DIFFERENTIAL: Hemoglobin: 10.8 g/dL — AB (ref 13.5–17.5)

## 2013-02-06 ENCOUNTER — Non-Acute Institutional Stay (SKILLED_NURSING_FACILITY): Payer: Medicare Other | Admitting: Nurse Practitioner

## 2013-02-06 ENCOUNTER — Encounter: Payer: Self-pay | Admitting: Nurse Practitioner

## 2013-02-06 DIAGNOSIS — B3789 Other sites of candidiasis: Secondary | ICD-10-CM | POA: Insufficient documentation

## 2013-02-06 DIAGNOSIS — R001 Bradycardia, unspecified: Secondary | ICD-10-CM

## 2013-02-06 DIAGNOSIS — I10 Essential (primary) hypertension: Secondary | ICD-10-CM

## 2013-02-06 DIAGNOSIS — N390 Urinary tract infection, site not specified: Secondary | ICD-10-CM

## 2013-02-06 DIAGNOSIS — D649 Anemia, unspecified: Secondary | ICD-10-CM

## 2013-02-06 DIAGNOSIS — E119 Type 2 diabetes mellitus without complications: Secondary | ICD-10-CM

## 2013-02-06 DIAGNOSIS — I2510 Atherosclerotic heart disease of native coronary artery without angina pectoris: Secondary | ICD-10-CM

## 2013-02-06 DIAGNOSIS — I471 Supraventricular tachycardia: Secondary | ICD-10-CM

## 2013-02-06 DIAGNOSIS — I498 Other specified cardiac arrhythmias: Secondary | ICD-10-CM

## 2013-02-06 NOTE — Assessment & Plan Note (Signed)
Creat 2.14 12/15/12-off Lisinopril/Furosemide--creat 2.19 01/10/13-2.18 02/04/13

## 2013-02-06 NOTE — Progress Notes (Signed)
Patient ID: [name] [name], male   DOB: [birth year], 79 y.o.   MRN: [id number]  Code Status: DNR  Allergies  Allergen Reactions  . Celebrex [Celecoxib]     Chief Complaint  Patient presents with  . Medical Managment of Chronic Issues    UTI, bradycardia, groin rash.    HPI: Patient is a 78 y.o. male seen in the SNF at [facility] today for evaluation of UTI, bradycardia, groin rash,  CKD and other chronic medical conditions.  Problem List Items Addressed This Visit   Anemia     Stable with Hgb 10.8 02/04/13(10.8 10/11/12), off Fe, takes Vit B12 daily      Bradycardia, sinus     HR 44--Metoprolol started 01/03/13 to replace Lisinopril for Bp control due to Creat. 2s. Reglan 5mg  ac/hs started 01/20/13 for gastroparesis--both may contribute to bradycardia--hold and to re-evaluate.     Candida rash of groin     R+L--apply Mycolog II cream bid for 2 weeks.     Chronic renal disease, stage III (Chronic)     Creat 2.14 12/15/12-off Lisinopril/Furosemide--creat 2.19 01/10/13-2.18 02/04/13        CORONARY ARTERY DISEASE     No c/o angina. Takes Atorvastatin 10mg  and daily ASA 81mg  for risk reduction.     DIABETES MELLITUS, TYPE II (Chronic)     Takes Novolog 8u with lunch 5u with breakfast and dinner  for CBG>150 and Levemir 14units since 01/20/13, Hgb A1c 7.8 10/11/12            HYPERTENSION     Controlled, off Lisinopril 5mg  2nd to creat 2.14 12/15/12-2.19 01/10/13, off  Lasix(due to creat >2 now). Controlled. Hold  Metoprolol 12.5mg  bid and Monitor Bp+P-HR 44 sinus bradycardia per EKG 02/03/13              Paroxysmal supraventricular tachycardia     HR in 40s, on Amiodarone 200mg  daily. Hold Metoprolol and Reglan for now--monitor Bp/P-re assess             Urinary tract infection, site not specified - Primary     Urine culture 02/03/13 showed aerococcus species >100,000c/ml--Ampicillin 500mg  q6hr for total 7 days started. The patient stated he is feeling  better today.        Review of Systems:  Review of Systems  Constitutional: Negative for fever, chills, weight loss, malaise/fatigue and diaphoresis.  HENT: Positive for hearing loss. Negative for ear pain, congestion, sore throat and neck pain.   Eyes: Negative for pain, discharge and redness.  Respiratory: Negative for cough, sputum production, shortness of breath and wheezing.   Cardiovascular: Positive for PND. Negative for chest pain, palpitations, orthopnea, claudication and leg swelling.  Gastrointestinal: Negative for heartburn, nausea, vomiting, abdominal pain, diarrhea and constipation.  Genitourinary: Positive for frequency. Negative for dysuria, urgency, hematuria and flank pain.  Musculoskeletal: Positive for back pain and joint pain. Negative for myalgias and falls.  Skin: Negative for itching and rash.  Neurological: Negative for dizziness, tingling, tremors, sensory change, speech change, focal weakness, seizures, loss of consciousness, weakness and headaches.  Endo/Heme/Allergies: Negative for environmental allergies and polydipsia. Does not bruise/bleed easily.  Psychiatric/Behavioral: Positive for memory loss. Negative for depression and hallucinations. The patient is not nervous/anxious and does not have insomnia.      Past Medical History  Diagnosis Date  . CVA (cerebral infarction) 1981    MI  . Type II or unspecified type diabetes mellitus without mention of complication, not stated as uncontrolled   .  Diverticulosis   . History of GI diverticular bleed 2007  . HTN (hypertension)   . AAA (abdominal aortic aneurysm)     CT 2006 3.2 cm  . Bladder cancer     Transitional cell carcinoma excised 2006  . Renal malignant tumor     transitional cell carcinoma lasered 2006  . Hyperlipidemia   . Hydrocele, left   . Inguinal hernia   . Coronary artery disease 05/05/2011    Echo EF 50%- 55% mild inferiof wall hypokinesis,mild dilatation left artium,mild mitral  annular calcif. mild MR, mild TR, and mild calcif. /sclerosis trileaflet aortic valve w/ trace aortic insufficiency  . Dysrhythmia     af  . Anemia   . GERD (gastroesophageal reflux disease)   . Arthritis   . Myocardial infarction 1981    inferior-wall MI   . Atrial fibrillation 07/04/2010    cardioversion - successful  normal sinus rhythm; May 2012 discontinue Coumadin  and increaseAspirin  . Peripheral vascular disease 11/13/2010    Abd aortic doppler mild abd aortic aneurysm 3.1 x 2.9cm  . Coronary artery disease 10/01/2009    Persantaine myview-EF 53% moderate perfusion d/t infaract/scar w/mild perinfaract ischemia seen Mid Anterior ,Mid Inferior, Apical Inferior , Basal and Mid inferolateral, Apical Lateral region  . Hypopotassemia 06/22/2012  . Edema 06/22/2012  . Congestive heart failure, unspecified 06/13/2012  . Olecranon bursitis 02/03/2012  . Pneumonia, organism unspecified 01/20/2012  . Neoplasm of uncertain behavior of skin 10/19/2011  . Unspecified venous (peripheral) insufficiency 08/26/2011  . Unspecified constipation 07/29/2011  . Lumbago 07/09/2011  . Personal history of fall 06/25/2011  . Unspecified malignant neoplasm of scalp and skin of neck 06/11/2011  . Other acquired deformity of ankle and foot(736.79) 06/11/2011  . Other alteration of consciousness 06/11/2011  . Hyposmolality and/or hyponatremia 05/07/2011  . Major depressive disorder, single episode, unspecified 05/07/2011  . Abnormality of gait 04/27/2011  . Other B-complex deficiencies 04/09/2011  . Senile dementia, uncomplicated 04/09/2011  . Phlebitis and thrombophlebitis of other deep vessels of lower extremities 04/09/2011  . Hemorrhage of rectum and anus 04/09/2011  . Loss of weight 04/09/2011  . Anemia, unspecified 9/8/209  . Malignant neoplasm of bladder, part unspecified 04/08/2008  . Malignant neoplasm of kidney, except pelvis 04/08/2005   Past Surgical History  Procedure Laterality Date  .  Coronary artery bypass graft  '94    LIMA-LAD, SVG-OM, PD, RCA  . Cataract extraction  2007  . Renal laser ablation of tumor  2006  . Coronary artery bypass graft  1994    LIMA to LAD, vein to the marginal, vein to the PDA, vein to the PLA   . Appendectomy    . Eye surgery    . Incision and drainage of wound  09/10/2011    Procedure: IRRIGATION AND DEBRIDEMENT WOUND;  Surgeon: Wayland Denis, DO;  Location: [facility];  Service: Plastics;  Laterality: N/A;  irrigation and debridement scalp wound with removal of bone and placement of intregra or acell and vac   Social History:   reports that he has quit smoking. He does not have any smokeless tobacco history on file. He reports that he does not drink alcohol or use illicit drugs.  Family History  Problem Relation Age of Onset  . Diabetes Mother   . Diabetes Daughter     Medications: Patient's Medications  New Prescriptions   No medications on file  Previous Medications   AMIODARONE (PACERONE) 200 MG TABLET    Take  200 mg by mouth daily. Take one tablet daily for heart.   ASPIRIN EC 325 MG TABLET    Take 325 mg by mouth daily.     ATORVASTATIN (LIPITOR) 10 MG TABLET    Take 10 mg by mouth every Monday, Wednesday, and Friday.    BISACODYL (DULCOLAX) 10 MG SUPPOSITORY    Place 10 mg rectally daily as needed. For constipation.    FERROUS SULFATE 325 (65 FE) MG TABLET    Take 325 mg by mouth 2 (two) times daily. Take 1 tablet twice daily for iron supplement   HYDROCHLOROTHIAZIDE (MICROZIDE) 12.5 MG CAPSULE    Take 12.5 mg by mouth daily.     INSULIN DETEMIR (LEVEMIR FLEXPEN) 100 UNIT/ML INJECTION    Inject 8 Units into the skin at bedtime.    INSULIN PEN NEEDLE (NOVOFINE) 32G X 6 MM MISC    as directed.    LISINOPRIL (PRINIVIL,ZESTRIL) 5 MG TABLET    Take 5 mg by mouth daily.   METFORMIN (GLUCOPHAGE) 500 MG TABLET    Take 500 mg by mouth 2 (two) times daily with a meal.   MIRTAZAPINE (REMERON) 15 MG TABLET    Take 15 mg by  mouth at bedtime. Take 1 nightly to help nerves and to stimulate appetite.   NUTRITIONAL SUPPLEMENTS (BOOST DIABETIC) LIQD    Take by mouth. Drink 1 can daily.   OMEPRAZOLE (PRILOSEC) 20 MG CAPSULE    Take 20 mg by mouth daily.     POLYETHYLENE GLYCOL (MIRALAX / GLYCOLAX) PACKET    Take 17 g by mouth every other day. Hold for constipation.    RAMIPRIL (ALTACE) 2.5 MG TABLET    Take 2.5 mg by mouth daily.   VITAMIN B-12 (CYANOCOBALAMIN) 1000 MCG TABLET    Take 1,000 mcg by mouth daily.    Modified Medications   No medications on file  Discontinued Medications   No medications on file     Physical Exam:Physical Exam  Constitutional: He is oriented to person, place, and time. He appears well-developed and well-nourished.  HENT:  Head: Normocephalic and atraumatic.  Eyes: Conjunctivae and EOM are normal. Pupils are equal, round, and reactive to light.  Neck: Normal range of motion. Neck supple. No JVD present. No thyromegaly present.  Cardiovascular: An irregular rhythm present. Bradycardia present.   Murmur heard.  Systolic murmur is present with a grade of 2/6  Pulmonary/Chest: Effort normal. He has no wheezes. He has rales (bibailar dry rales). He exhibits no tenderness.  Abdominal: Soft. Bowel sounds are normal. There is no tenderness.  Musculoskeletal: Normal range of motion. He exhibits no edema and no tenderness.  Lymphadenopathy:    He has no cervical adenopathy.  Neurological: He is alert and oriented to person, place, and time. He has normal reflexes. No cranial nerve deficit. He exhibits normal muscle tone. Coordination normal.  Skin: Skin is warm and dry. No rash noted. No erythema.  Psychiatric: He has a normal mood and affect. His behavior is normal. Judgment and thought content normal.    Filed Vitals:   02/06/13 1140  BP: 136/76  Pulse: 48  Temp: 98.5 F (36.9 C)  TempSrc: Tympanic  Resp: 22      Labs reviewed: Basic Metabolic Panel:  Recent Labs  16/10/96   10/11/12 11/15/12 12/15/12 01/10/13  NA  --   < > 139 135* 138 137  K  --   < > 4.2 4.3 4.4 5.1  BUN  --   < >  53* 55* 53* 58*  CREATININE  --   < > 2.0* 2.0* 2.1* 2.2*  TSH 2.32  --  2.97  --   --   --   < > = values in this interval not displayed. Liver Function Tests:  Recent Labs  10/11/12  AST 18  ALT 15  ALKPHOS 89   CBC:  Recent Labs  06/02/12 10/11/12  WBC 8.1 7.9  HGB 12.4* 10.8*  HCT 37* 32*  PLT 179 201   Past Procedures: 01/14/12 CXR pattern of mild pneumonitis throughout the right lund.   Assessment/Plan Urinary tract infection, site not specified Urine culture 02/03/13 showed aerococcus species >100,000c/ml--Ampicillin 500mg  q6hr for total 7 days started. The patient stated he is feeling better today.   Chronic renal disease, stage III Creat 2.14 12/15/12-off Lisinopril/Furosemide--creat 2.19 01/10/13-2.18 02/04/13      Anemia Stable with Hgb 10.8 02/04/13(10.8 10/11/12), off Fe, takes Vit B12 daily    Candida rash of groin R+L--apply Mycolog II cream bid for 2 weeks.   DIABETES MELLITUS, TYPE II Takes Novolog 8u with lunch 5u with breakfast and dinner  for CBG>150 and Levemir 14units since 01/20/13, Hgb A1c 7.8 10/11/12          HYPERTENSION Controlled, off Lisinopril 5mg  2nd to creat 2.14 12/15/12-2.19 01/10/13, off  Lasix(due to creat >2 now). Controlled. Hold  Metoprolol 12.5mg  bid and Monitor Bp+P-HR 44 sinus bradycardia per EKG 02/03/13            Bradycardia, sinus HR 44--Metoprolol started 01/03/13 to replace Lisinopril for Bp control due to Creat. 2s. Reglan 5mg  ac/hs started 01/20/13 for gastroparesis--both may contribute to bradycardia--hold and to re-evaluate.   CORONARY ARTERY DISEASE No c/o angina. Takes Atorvastatin 10mg  and daily ASA 81mg  for risk reduction.   Paroxysmal supraventricular tachycardia HR in 40s, on Amiodarone 200mg  daily. Hold Metoprolol and Reglan for now--monitor Bp/P-re assess              Family/ Staff Communication: none  Goals of Care: SNF  Labs/tests ordered: none

## 2013-02-06 NOTE — Assessment & Plan Note (Signed)
HR 44--Metoprolol started 01/03/13 to replace Lisinopril for Bp control due to Creat. 2s. Reglan 5mg  ac/hs started 01/20/13 for gastroparesis--both may contribute to bradycardia--hold and to re-evaluate.

## 2013-02-06 NOTE — Assessment & Plan Note (Signed)
R+L--apply Mycolog II cream bid for 2 weeks.

## 2013-02-06 NOTE — Assessment & Plan Note (Signed)
Stable with Hgb 10.8 02/04/13(10.8 10/11/12), off Fe, takes Vit B12 daily

## 2013-02-06 NOTE — Assessment & Plan Note (Signed)
HR in 40s, on Amiodarone 200mg  daily. Hold Metoprolol and Reglan for now--monitor Bp/P-re assess

## 2013-02-06 NOTE — Assessment & Plan Note (Addendum)
Takes Novolog 8u with lunch 5u with breakfast and dinner  for CBG>150 and Levemir 14units since 01/20/13, Hgb A1c 7.8 10/11/12

## 2013-02-06 NOTE — Assessment & Plan Note (Signed)
No c/o angina. Takes Atorvastatin 10mg and daily ASA 81mg for risk reduction.    

## 2013-02-06 NOTE — Assessment & Plan Note (Signed)
Controlled, off Lisinopril 5mg  2nd to creat 2.14 12/15/12-2.19 01/10/13, off  Lasix(due to creat >2 now). Controlled. Hold  Metoprolol 12.5mg  bid and Monitor Bp+P-HR 44 sinus bradycardia per EKG 02/03/13

## 2013-02-06 NOTE — Assessment & Plan Note (Signed)
Urine culture 02/03/13 showed aerococcus species >100,000c/ml--Ampicillin 500mg  q6hr for total 7 days started. The patient stated he is feeling better today.

## 2013-02-15 ENCOUNTER — Non-Acute Institutional Stay (SKILLED_NURSING_FACILITY): Payer: Medicare Other | Admitting: Nurse Practitioner

## 2013-02-15 DIAGNOSIS — R001 Bradycardia, unspecified: Secondary | ICD-10-CM

## 2013-02-15 DIAGNOSIS — I498 Other specified cardiac arrhythmias: Secondary | ICD-10-CM

## 2013-02-15 DIAGNOSIS — I2510 Atherosclerotic heart disease of native coronary artery without angina pectoris: Secondary | ICD-10-CM

## 2013-02-15 DIAGNOSIS — E119 Type 2 diabetes mellitus without complications: Secondary | ICD-10-CM

## 2013-02-15 DIAGNOSIS — R413 Other amnesia: Secondary | ICD-10-CM

## 2013-02-15 DIAGNOSIS — E538 Deficiency of other specified B group vitamins: Secondary | ICD-10-CM

## 2013-02-15 DIAGNOSIS — I471 Supraventricular tachycardia: Secondary | ICD-10-CM

## 2013-02-15 DIAGNOSIS — N390 Urinary tract infection, site not specified: Secondary | ICD-10-CM

## 2013-02-15 DIAGNOSIS — D649 Anemia, unspecified: Secondary | ICD-10-CM

## 2013-02-15 DIAGNOSIS — I10 Essential (primary) hypertension: Secondary | ICD-10-CM

## 2013-02-15 DIAGNOSIS — K219 Gastro-esophageal reflux disease without esophagitis: Secondary | ICD-10-CM

## 2013-02-15 NOTE — Assessment & Plan Note (Signed)
124/62, 118/78, 120/82, 138/84, 108/70, 118/72, 138/70, 130/80--controlled, off antihypertensive agents. Continue to monitor Bps.

## 2013-02-15 NOTE — Assessment & Plan Note (Signed)
Continue with B12 daily po. Hgb 10.8 02/04/13

## 2013-02-15 NOTE — Assessment & Plan Note (Signed)
Resolved after Metoprolol and Reglan held-dc both today.

## 2013-02-15 NOTE — Assessment & Plan Note (Addendum)
Mild abnormality on brief-MMSE. Family very aware of changes.  The recent resolved acute delirium was likeled related to his UTI and bradycardia.

## 2013-02-15 NOTE — Assessment & Plan Note (Signed)
Takes Novolog 8u with lunch 5u with breakfast and dinner  for CBG>150 and Levemir 14units since 01/20/13, Hgb A1c 7.8 10/11/12             

## 2013-02-15 NOTE — Assessment & Plan Note (Signed)
Stable with Hgb 10.8 02/04/13(10.8 10/11/12), off Fe, takes Vit B12 1000mcg daily   

## 2013-02-15 NOTE — Assessment & Plan Note (Signed)
Stable on Omeprazole 20mg  bid. No worsening of s/s of GERD since Reglan held-dc today.

## 2013-02-15 NOTE — Assessment & Plan Note (Signed)
HR in 40s-resolved to 60s at rest since Metoprolol and Reglan held, on Amiodarone 200mg  daily. dc Metoprolol and Reglan. No return of s/s of GERD.

## 2013-02-15 NOTE — Assessment & Plan Note (Signed)
No c/o angina. Takes Atorvastatin 10mg and daily ASA 81mg for risk reduction.    

## 2013-02-15 NOTE — Assessment & Plan Note (Signed)
Urine culture 02/03/13 showed aerococcus species >100,000c/ml--fully treated with Ampicillin-the patient stated he feels fine today.

## 2013-02-15 NOTE — Assessment & Plan Note (Signed)
Creat 2.14 12/15/12-off Lisinopril/Furosemide--creat 2.19 01/10/13-2.18 02/04/13     

## 2013-02-15 NOTE — Progress Notes (Signed)
Patient ID: [name] [name], male   DOB: [birth year], 79 y.o.   MRN: [id number] Code Status: DNR  Allergies  Allergen Reactions  . Celebrex [Celecoxib]     Chief Complaint  Patient presents with  . Medical Managment of Chronic Issues    blood pressure, Bradycardia, GERD    HPI: Patient is a 79 y.o. male seen in the SNF at [facility] today for evaluation of  Blood pressure, bradycardia follow up, GERD, resolved delirium, treated UTI, and other chronic medical conditions.  Problem List Items Addressed This Visit   Anemia     Stable with Hgb 10.8 02/04/13(10.8 10/11/12), off Fe, takes Vit B12 daily        Bradycardia, sinus     Resolved after Metoprolol and Reglan held-dc both today.     Chronic renal disease, stage III (Chronic)     Creat 2.14 12/15/12-off Lisinopril/Furosemide--creat 2.19 01/10/13-2.18 02/04/13          CORONARY ARTERY DISEASE     No c/o angina. Takes Atorvastatin 10mg  and daily ASA 81mg  for risk reduction.       DIABETES MELLITUS, TYPE II (Chronic)     Takes Novolog 8u with lunch 5u with breakfast and dinner  for CBG>150 and Levemir 14units since 01/20/13, Hgb A1c 7.8 10/11/12              GERD (gastroesophageal reflux disease)     Stable on Omeprazole 20mg  bid. No worsening of s/s of GERD since Reglan held-dc today.       HYPERTENSION     124/62, 118/78, 120/82, 138/84, 108/70, 118/72, 138/70, 130/80--controlled, off antihypertensive agents. Continue to monitor Bps.     Memory loss     Mild abnormality on brief-MMSE. Family very aware of changes.  The recent resolved acute delirium was likeled related to his UTI and bradycardia.        Paroxysmal supraventricular tachycardia     HR in 40s-resolved to 60s at rest since Metoprolol and Reglan held, on Amiodarone 200mg  daily. dc Metoprolol and Reglan. No return of s/s of GERD.               Urinary tract infection, site not specified     Urine culture 02/03/13 showed  aerococcus species >100,000c/ml--fully treated with Ampicillin-the patient stated he feels fine today.       Vitamin B 12 deficiency - Primary     Continue with B12 daily po. Hgb 10.8 02/04/13       Review of Systems:  Review of Systems  Constitutional: Negative for fever, chills, weight loss, malaise/fatigue and diaphoresis.  HENT: Positive for hearing loss. Negative for ear pain, congestion, sore throat and neck pain.   Eyes: Negative for pain, discharge and redness.  Respiratory: Negative for cough, sputum production, shortness of breath and wheezing.   Cardiovascular: Positive for PND. Negative for chest pain, palpitations, orthopnea, claudication and leg swelling.       Resolved bradycardia-HR 60s  Gastrointestinal: Negative for heartburn, nausea, vomiting, abdominal pain, diarrhea and constipation.  Genitourinary: Positive for frequency. Negative for dysuria, urgency, hematuria and flank pain.  Musculoskeletal: Positive for back pain and joint pain. Negative for myalgias and falls.  Skin: Negative for itching and rash.  Neurological: Negative for dizziness, tingling, tremors, sensory change, speech change, focal weakness, seizures, loss of consciousness, weakness and headaches.  Endo/Heme/Allergies: Negative for environmental allergies and polydipsia. Does not bruise/bleed easily.  Psychiatric/Behavioral: Positive for memory loss. Negative for depression and  hallucinations. The patient is not nervous/anxious and does not have insomnia.      Past Medical History  Diagnosis Date  . CVA (cerebral infarction) 1981    MI  . Type II or unspecified type diabetes mellitus without mention of complication, not stated as uncontrolled   . Diverticulosis   . History of GI diverticular bleed 2007  . HTN (hypertension)   . AAA (abdominal aortic aneurysm)     CT 2006 3.2 cm  . Bladder cancer     Transitional cell carcinoma excised 2006  . Renal malignant tumor     transitional  cell carcinoma lasered 2006  . Hyperlipidemia   . Hydrocele, left   . Inguinal hernia   . Coronary artery disease 05/05/2011    Echo EF 50%- 55% mild inferiof wall hypokinesis,mild dilatation left artium,mild mitral annular calcif. mild MR, mild TR, and mild calcif. /sclerosis trileaflet aortic valve w/ trace aortic insufficiency  . Dysrhythmia     af  . Anemia   . GERD (gastroesophageal reflux disease)   . Arthritis   . Myocardial infarction 1981    inferior-wall MI   . Atrial fibrillation 07/04/2010    cardioversion - successful  normal sinus rhythm; May 2012 discontinue Coumadin  and increaseAspirin  . Peripheral vascular disease 11/13/2010    Abd aortic doppler mild abd aortic aneurysm 3.1 x 2.9cm  . Coronary artery disease 10/01/2009    Persantaine myview-EF 53% moderate perfusion d/t infaract/scar w/mild perinfaract ischemia seen Mid Anterior ,Mid Inferior, Apical Inferior , Basal and Mid inferolateral, Apical Lateral region  . Hypopotassemia 06/22/2012  . Edema 06/22/2012  . Congestive heart failure, unspecified 06/13/2012  . Olecranon bursitis 02/03/2012  . Pneumonia, organism unspecified 01/20/2012  . Neoplasm of uncertain behavior of skin 10/19/2011  . Unspecified venous (peripheral) insufficiency 08/26/2011  . Unspecified constipation 07/29/2011  . Lumbago 07/09/2011  . Personal history of fall 06/25/2011  . Unspecified malignant neoplasm of scalp and skin of neck 06/11/2011  . Other acquired deformity of ankle and foot(736.79) 06/11/2011  . Other alteration of consciousness 06/11/2011  . Hyposmolality and/or hyponatremia 05/07/2011  . Major depressive disorder, single episode, unspecified 05/07/2011  . Abnormality of gait 04/27/2011  . Other B-complex deficiencies 04/09/2011  . Senile dementia, uncomplicated 04/09/2011  . Phlebitis and thrombophlebitis of other deep vessels of lower extremities 04/09/2011  . Hemorrhage of rectum and anus 04/09/2011  . Loss of weight  04/09/2011  . Anemia, unspecified 9/8/209  . Malignant neoplasm of bladder, part unspecified 04/08/2008  . Malignant neoplasm of kidney, except pelvis 04/08/2005   Past Surgical History  Procedure Laterality Date  . Coronary artery bypass graft  '94    LIMA-LAD, SVG-OM, PD, RCA  . Cataract extraction  2007  . Renal laser ablation of tumor  2006  . Coronary artery bypass graft  1994    LIMA to LAD, vein to the marginal, vein to the PDA, vein to the PLA   . Appendectomy    . Eye surgery    . Incision and drainage of wound  09/10/2011    Procedure: IRRIGATION AND DEBRIDEMENT WOUND;  Surgeon: Wayland Denis, DO;  Location: [facility];  Service: Plastics;  Laterality: N/A;  irrigation and debridement scalp wound with removal of bone and placement of intregra or acell and vac   Social History:   reports that he has quit smoking. He does not have any smokeless tobacco history on file. He reports that he does not drink alcohol  or use illicit drugs.  Family History  Problem Relation Age of Onset  . Diabetes Mother   . Diabetes Daughter     Medications: Patient's Medications  New Prescriptions   No medications on file  Previous Medications   AMIODARONE (PACERONE) 200 MG TABLET    Take 200 mg by mouth daily. Take one tablet daily for heart.   ASPIRIN EC 325 MG TABLET    Take 81 mg by mouth daily.    ATORVASTATIN (LIPITOR) 10 MG TABLET    Take 10 mg by mouth every Monday, Wednesday, and Friday.    INSULIN DETEMIR (LEVEMIR FLEXPEN) 100 UNIT/ML INJECTION    Inject 14 Units into the skin at bedtime.    INSULIN PEN NEEDLE (NOVOFINE) 32G X 6 MM MISC    as directed.    NUTRITIONAL SUPPLEMENTS (BOOST DIABETIC) LIQD    Take by mouth. Drink 1 can daily.   OMEPRAZOLE (PRILOSEC) 20 MG CAPSULE    Take 20 mg by mouth daily.     VITAMIN B-12 (CYANOCOBALAMIN) 1000 MCG TABLET    Take 1,000 mcg by mouth daily.    Modified Medications   No medications on file  Discontinued Medications    BISACODYL (DULCOLAX) 10 MG SUPPOSITORY    Place 10 mg rectally daily as needed. For constipation.    FERROUS SULFATE 325 (65 FE) MG TABLET    Take 325 mg by mouth 2 (two) times daily. Take 1 tablet twice daily for iron supplement   HYDROCHLOROTHIAZIDE (MICROZIDE) 12.5 MG CAPSULE    Take 12.5 mg by mouth daily.     LISINOPRIL (PRINIVIL,ZESTRIL) 5 MG TABLET    Take 5 mg by mouth daily.   METFORMIN (GLUCOPHAGE) 500 MG TABLET    Take 500 mg by mouth 2 (two) times daily with a meal.   MIRTAZAPINE (REMERON) 15 MG TABLET    Take 15 mg by mouth at bedtime. Take 1 nightly to help nerves and to stimulate appetite.   POLYETHYLENE GLYCOL (MIRALAX / GLYCOLAX) PACKET    Take 17 g by mouth every other day. Hold for constipation.    RAMIPRIL (ALTACE) 2.5 MG TABLET    Take 2.5 mg by mouth daily.     Physical Exam: Physical Exam  Constitutional: He is oriented to person, place, and time. He appears well-developed and well-nourished.  HENT:  Head: Normocephalic and atraumatic.  Eyes: Conjunctivae and EOM are normal. Pupils are equal, round, and reactive to light.  Neck: Normal range of motion. Neck supple. No JVD present. No thyromegaly present.  Cardiovascular: Normal rate.  An irregular rhythm present.  Murmur heard.  Systolic murmur is present with a grade of 2/6  Pulmonary/Chest: Effort normal. He has no wheezes. He has rales (bibailar dry rales). He exhibits no tenderness.  Abdominal: Soft. Bowel sounds are normal. There is no tenderness.  Musculoskeletal: Normal range of motion. He exhibits no edema and no tenderness.  Lymphadenopathy:    He has no cervical adenopathy.  Neurological: He is alert and oriented to person, place, and time. He has normal reflexes. No cranial nerve deficit. He exhibits normal muscle tone. Coordination normal.  Skin: Skin is warm and dry. No rash noted. No erythema.  Psychiatric: He has a normal mood and affect. His behavior is normal. Judgment and thought content normal.     Filed Vitals:   02/15/13 1038  BP: 118/72  Pulse: 60  Temp: 98.2 F (36.8 C)  TempSrc: Tympanic  Resp: 16  Labs reviewed: Basic Metabolic Panel:  Recent Labs  16/10/96  10/11/12  12/15/12 01/10/13 02/04/13  NA  --   < > 139  < > 138 137 139  K  --   < > 4.2  < > 4.4 5.1 4.5  BUN  --   < > 53*  < > 53* 58* 43*  CREATININE  --   < > 2.0*  < > 2.1* 2.2* 2.2*  TSH 2.32  --  2.97  --   --   --   --   < > = values in this interval not displayed. Liver Function Tests:  Recent Labs  10/11/12  AST 18  ALT 15  ALKPHOS 89   CBC:  Recent Labs  06/02/12 10/11/12 02/04/13  WBC 8.1 7.9 8.7  HGB 12.4* 10.8* 10.8*  HCT 37* 32* 32*  PLT 179 201 220    Past Procedures:  02/03/13 EKG vent rate 44, sinus bradycardia.    Assessment/Plan Vitamin B 12 deficiency Continue with B12 daily po. Hgb 10.8 02/04/13  Paroxysmal supraventricular tachycardia HR in 40s-resolved to 60s at rest since Metoprolol and Reglan held, on Amiodarone 200mg  daily. dc Metoprolol and Reglan. No return of s/s of GERD.             Memory loss Mild abnormality on brief-MMSE. Family very aware of changes.  The recent resolved acute delirium was likeled related to his UTI and bradycardia.      DIABETES MELLITUS, TYPE II Takes Novolog 8u with lunch 5u with breakfast and dinner  for CBG>150 and Levemir 14units since 01/20/13, Hgb A1c 7.8 10/11/12            Urinary tract infection, site not specified Urine culture 02/03/13 showed aerococcus species >100,000c/ml--fully treated with Ampicillin-the patient stated he feels fine today.     Anemia Stable with Hgb 10.8 02/04/13(10.8 10/11/12), off Fe, takes Vit B12 daily      Bradycardia, sinus Resolved after Metoprolol and Reglan held-dc both today.   Chronic renal disease, stage III Creat 2.14 12/15/12-off Lisinopril/Furosemide--creat 2.19 01/10/13-2.18 02/04/13        CORONARY ARTERY DISEASE No c/o angina.  Takes Atorvastatin 10mg  and daily ASA 81mg  for risk reduction.     GERD (gastroesophageal reflux disease) Stable on Omeprazole 20mg  bid. No worsening of s/s of GERD since Reglan held-dc today.     HYPERTENSION 124/62, 118/78, 120/82, 138/84, 108/70, 118/72, 138/70, 130/80--controlled, off antihypertensive agents. Continue to monitor Bps.     Family/ Staff Communication: observe the patient  Goals of Care: SNF  Labs/tests ordered: none

## 2013-03-14 LAB — LIPID PANEL: HDL: 37 mg/dL (ref 35–70)

## 2013-03-15 ENCOUNTER — Non-Acute Institutional Stay (SKILLED_NURSING_FACILITY): Payer: Medicare Other | Admitting: Nurse Practitioner

## 2013-03-15 ENCOUNTER — Encounter: Payer: Self-pay | Admitting: Nurse Practitioner

## 2013-03-15 DIAGNOSIS — E119 Type 2 diabetes mellitus without complications: Secondary | ICD-10-CM

## 2013-03-15 DIAGNOSIS — D649 Anemia, unspecified: Secondary | ICD-10-CM

## 2013-03-15 DIAGNOSIS — E1129 Type 2 diabetes mellitus with other diabetic kidney complication: Secondary | ICD-10-CM

## 2013-03-15 DIAGNOSIS — N189 Chronic kidney disease, unspecified: Secondary | ICD-10-CM

## 2013-03-15 DIAGNOSIS — I471 Supraventricular tachycardia: Secondary | ICD-10-CM

## 2013-03-15 DIAGNOSIS — L259 Unspecified contact dermatitis, unspecified cause: Secondary | ICD-10-CM

## 2013-03-15 DIAGNOSIS — E785 Hyperlipidemia, unspecified: Secondary | ICD-10-CM

## 2013-03-15 DIAGNOSIS — L309 Dermatitis, unspecified: Secondary | ICD-10-CM

## 2013-03-15 DIAGNOSIS — E1122 Type 2 diabetes mellitus with diabetic chronic kidney disease: Secondary | ICD-10-CM

## 2013-03-15 DIAGNOSIS — K219 Gastro-esophageal reflux disease without esophagitis: Secondary | ICD-10-CM

## 2013-03-15 NOTE — Assessment & Plan Note (Signed)
Takes Novolog 8u with lunch 5u with breakfast and dinner  for CBG>150 and Levemir 14units since 01/20/13, Hgb A1c 7.8 10/11/12

## 2013-03-15 NOTE — Assessment & Plan Note (Signed)
LDL 128 03/14/13-takes Atorvastatin 10mg 3x/week.    

## 2013-03-15 NOTE — Assessment & Plan Note (Signed)
HR in 40s-resolved to 60s at rest since Metoprolol and Reglan held-dc'd subsequetly, on Amiodarone 200mg  daily.

## 2013-03-15 NOTE — Assessment & Plan Note (Signed)
Creat 2.14 12/15/12-off Lisinopril/Furosemide--creat 2.19 01/10/13-2.18 02/04/13-update CMP

## 2013-03-15 NOTE — Progress Notes (Signed)
Patient ID: [name] [name], male   DOB: 06-08-1920, 79 y.o.   MRN: [id number]  Code Status: DNR  Allergies  Allergen Reactions  . Celebrex [Celecoxib]     Chief Complaint  Patient presents with  . Medical Managment of Chronic Issues    scaly rash on top of head.     HPI: Patient is a 79 y.o. male seen in the SNF at [facility] today for evaluation of  Scalp scaly rash, CKD, DMII, Blood pressure, bradycardia follow up, GERD,  and other chronic medical conditions.  Problem List Items Addressed This Visit   Anemia - Primary     Stable with Hgb 10.8 02/04/13(10.8 10/11/12), off Fe, takes Vit B12 daily, update CBC          Chronic renal disease, stage III (Chronic)     Creat 2.14 12/15/12-off Lisinopril/Furosemide--creat 2.19 01/10/13-2.18 02/04/13-update CMP            Dermatitis     Scaly redness scattered on his scalp-will apply Triamcinolone cream bid for 30 days-observe.     DIABETES MELLITUS, TYPE II (Chronic)     Takes Novolog 8u with lunch 5u with breakfast and dinner  for CBG>150 and Levemir 14units since 01/20/13, Hgb A1c 7.8 10/11/12                GERD (gastroesophageal reflux disease)     Stable on Omeprazole 20mg  bid. No worsening of s/s of GERD since off Reglan         HYPERLIPIDEMIA     LDL 128 03/14/13-takes Atorvastatin 10mg  3x/week.     Paroxysmal supraventricular tachycardia     HR in 40s-resolved to 60s at rest since Metoprolol and Reglan held-dc'd subsequetly, on Amiodarone 200mg  daily.                 Type 2 diabetes mellitus with diabetic chronic kidney disease     Long standing DMII with chronic kidney disease-creatinine 2s--update BMP and check urine for micro albumin.        Review of Systems:  Review of Systems  Constitutional: Negative for fever, chills, weight loss, malaise/fatigue and diaphoresis.  HENT: Positive for hearing loss. Negative for congestion, ear pain and sore throat.   Eyes: Negative  for pain, discharge and redness.  Respiratory: Negative for cough, sputum production, shortness of breath and wheezing.   Cardiovascular: Positive for PND. Negative for chest pain, palpitations, orthopnea, claudication and leg swelling.       Resolved bradycardia-HR 60s  Gastrointestinal: Negative for heartburn, nausea, vomiting, abdominal pain, diarrhea and constipation.  Genitourinary: Positive for frequency. Negative for dysuria, urgency, hematuria and flank pain.  Musculoskeletal: Positive for back pain and joint pain. Negative for falls, myalgias and neck pain.  Skin: Negative for itching and rash.       Scaly rash on scalp  Neurological: Negative for dizziness, tingling, tremors, sensory change, speech change, focal weakness, seizures, loss of consciousness, weakness and headaches.  Endo/Heme/Allergies: Negative for environmental allergies and polydipsia. Does not bruise/bleed easily.  Psychiatric/Behavioral: Positive for memory loss. Negative for depression and hallucinations. The patient is not nervous/anxious and does not have insomnia.      Past Medical History  Diagnosis Date  . CVA (cerebral infarction) 1981    MI  . Type II or unspecified type diabetes mellitus without mention of complication, not stated as uncontrolled   . Diverticulosis   . History of GI diverticular bleed 2007  . HTN (hypertension)   .  AAA (abdominal aortic aneurysm)     CT 2006 3.2 cm  . Bladder cancer     Transitional cell carcinoma excised 2006  . Renal malignant tumor     transitional cell carcinoma lasered 2006  . Hyperlipidemia   . Hydrocele, left   . Inguinal hernia   . Coronary artery disease 05/05/2011    Echo EF 50%- 55% mild inferiof wall hypokinesis,mild dilatation left artium,mild mitral annular calcif. mild MR, mild TR, and mild calcif. /sclerosis trileaflet aortic valve w/ trace aortic insufficiency  . Dysrhythmia     af  . Anemia   . GERD (gastroesophageal reflux disease)   .  Arthritis   . Myocardial infarction 1981    inferior-wall MI   . Atrial fibrillation 07/04/2010    cardioversion - successful  normal sinus rhythm; May 2012 discontinue Coumadin  and increaseAspirin  . Peripheral vascular disease 11/13/2010    Abd aortic doppler mild abd aortic aneurysm 3.1 x 2.9cm  . Coronary artery disease 10/01/2009    Persantaine myview-EF 53% moderate perfusion d/t infaract/scar w/mild perinfaract ischemia seen Mid Anterior ,Mid Inferior, Apical Inferior , Basal and Mid inferolateral, Apical Lateral region  . Hypopotassemia 06/22/2012  . Edema 06/22/2012  . Congestive heart failure, unspecified 06/13/2012  . Olecranon bursitis 02/03/2012  . Pneumonia, organism unspecified 01/20/2012  . Neoplasm of uncertain behavior of skin 10/19/2011  . Unspecified venous (peripheral) insufficiency 08/26/2011  . Unspecified constipation 07/29/2011  . Lumbago 07/09/2011  . Personal history of fall 06/25/2011  . Unspecified malignant neoplasm of scalp and skin of neck 06/11/2011  . Other acquired deformity of ankle and foot(736.79) 06/11/2011  . Other alteration of consciousness 06/11/2011  . Hyposmolality and/or hyponatremia 05/07/2011  . Major depressive disorder, single episode, unspecified 05/07/2011  . Abnormality of gait 04/27/2011  . Other B-complex deficiencies 04/09/2011  . Senile dementia, uncomplicated 04/09/2011  . Phlebitis and thrombophlebitis of other deep vessels of lower extremities 04/09/2011  . Hemorrhage of rectum and anus 04/09/2011  . Loss of weight 04/09/2011  . Anemia, unspecified 9/8/209  . Malignant neoplasm of bladder, part unspecified 04/08/2008  . Malignant neoplasm of kidney, except pelvis 04/08/2005   Past Surgical History  Procedure Laterality Date  . Coronary artery bypass graft  '94    LIMA-LAD, SVG-OM, PD, RCA  . Cataract extraction  2007  . Renal laser ablation of tumor  2006  . Coronary artery bypass graft  1994    LIMA to LAD, vein to the  marginal, vein to the PDA, vein to the PLA   . Appendectomy    . Eye surgery    . Incision and drainage of wound  09/10/2011    Procedure: IRRIGATION AND DEBRIDEMENT WOUND;  Surgeon: Wayland Denis, DO;  Location: [facility];  Service: Plastics;  Laterality: N/A;  irrigation and debridement scalp wound with removal of bone and placement of intregra or acell and vac   Social History:   reports that he has quit smoking. He does not have any smokeless tobacco history on file. He reports that he does not drink alcohol or use illicit drugs.  Family History  Problem Relation Age of Onset  . Diabetes Mother   . Diabetes Daughter     Medications: Patient's Medications  New Prescriptions   No medications on file  Previous Medications   AMIODARONE (PACERONE) 200 MG TABLET    Take 200 mg by mouth daily. Take one tablet daily for heart.   ASPIRIN EC 325 MG  TABLET    Take 81 mg by mouth daily.    ATORVASTATIN (LIPITOR) 10 MG TABLET    Take 10 mg by mouth every Monday, Wednesday, and Friday.    INSULIN DETEMIR (LEVEMIR FLEXPEN) 100 UNIT/ML INJECTION    Inject 14 Units into the skin at bedtime.    INSULIN PEN NEEDLE (NOVOFINE) 32G X 6 MM MISC    as directed.    NUTRITIONAL SUPPLEMENTS (BOOST DIABETIC) LIQD    Take by mouth. Drink 1 can daily.   OMEPRAZOLE (PRILOSEC) 20 MG CAPSULE    Take 20 mg by mouth daily.     VITAMIN B-12 (CYANOCOBALAMIN) 1000 MCG TABLET    Take 1,000 mcg by mouth daily.    Modified Medications   No medications on file  Discontinued Medications   No medications on file     Physical Exam: Physical Exam  Constitutional: He is oriented to person, place, and time. He appears well-developed and well-nourished.  HENT:  Head: Normocephalic and atraumatic.  Eyes: Conjunctivae and EOM are normal. Pupils are equal, round, and reactive to light.  Neck: Normal range of motion. Neck supple. No JVD present. No thyromegaly present.  Cardiovascular: Normal rate.  An  irregular rhythm present.  Murmur heard.  Systolic murmur is present with a grade of 2/6  Pulmonary/Chest: Effort normal. He has no wheezes. He has rales (bibailar dry rales). He exhibits no tenderness.  Abdominal: Soft. Bowel sounds are normal. There is no tenderness.  Musculoskeletal: Normal range of motion. He exhibits no edema and no tenderness.  Lymphadenopathy:    He has no cervical adenopathy.  Neurological: He is alert and oriented to person, place, and time. He has normal reflexes. No cranial nerve deficit. He exhibits normal muscle tone. Coordination normal.  Skin: Skin is warm and dry. No rash noted. No erythema.  Scalp scaly rash  Psychiatric: He has a normal mood and affect. His behavior is normal. Judgment and thought content normal.    Filed Vitals:   03/15/13 1205  BP: 138/82  Pulse: 64  Temp: 98.2 F (36.8 C)  TempSrc: Tympanic  Resp: 16      Labs reviewed: Basic Metabolic Panel:  Recent Labs  16/10/96  10/11/12  12/15/12 01/10/13 02/04/13  NA  --   < > 139  < > 138 137 139  K  --   < > 4.2  < > 4.4 5.1 4.5  BUN  --   < > 53*  < > 53* 58* 43*  CREATININE  --   < > 2.0*  < > 2.1* 2.2* 2.2*  TSH 2.32  --  2.97  --   --   --   --   < > = values in this interval not displayed. Liver Function Tests:  Recent Labs  10/11/12  AST 18  ALT 15  ALKPHOS 89   CBC:  Recent Labs  06/02/12 10/11/12 02/04/13  WBC 8.1 7.9 8.7  HGB 12.4* 10.8* 10.8*  HCT 37* 32* 32*  PLT 179 201 220    Past Procedures:  02/03/13 EKG vent rate 44, sinus bradycardia.    Assessment/Plan Anemia Stable with Hgb 10.8 02/04/13(10.8 10/11/12), off Fe, takes Vit B12 daily, update CBC        Chronic renal disease, stage III Creat 2.14 12/15/12-off Lisinopril/Furosemide--creat 2.19 01/10/13-2.18 02/04/13-update CMP          Paroxysmal supraventricular tachycardia HR in 40s-resolved to 60s at rest since Metoprolol and Reglan held-dc'd subsequetly, on  Amiodarone 200mg   daily.               GERD (gastroesophageal reflux disease) Stable on Omeprazole 20mg  bid. No worsening of s/s of GERD since off Reglan       HYPERLIPIDEMIA LDL 128 03/14/13-takes Atorvastatin 10mg  3x/week.   DIABETES MELLITUS, TYPE II Takes Novolog 8u with lunch 5u with breakfast and dinner  for CBG>150 and Levemir 14units since 01/20/13, Hgb A1c 7.8 10/11/12              Type 2 diabetes mellitus with diabetic chronic kidney disease Long standing DMII with chronic kidney disease-creatinine 2s--update BMP and check urine for micro albumin.   Dermatitis Scaly redness scattered on his scalp-will apply Triamcinolone cream bid for 30 days-observe.     Family/ Staff Communication: observe the patient  Goals of Care: SNF  Labs/tests ordered: CMP, CBC, Urine micro ablumin.

## 2013-03-15 NOTE — Assessment & Plan Note (Signed)
Stable with Hgb 10.8 02/04/13(10.8 10/11/12), off Fe, takes Vit B12 daily, update CBC

## 2013-03-15 NOTE — Assessment & Plan Note (Signed)
Long standing DMII with chronic kidney disease-creatinine 2s--update BMP and check urine for micro albumin.

## 2013-03-15 NOTE — Assessment & Plan Note (Signed)
Scaly redness scattered on his scalp-will apply Triamcinolone cream bid for 30 days-observe.

## 2013-03-15 NOTE — Assessment & Plan Note (Signed)
Stable on Omeprazole 20mg bid. No worsening of s/s of GERD since off Reglan           

## 2013-03-16 LAB — CBC AND DIFFERENTIAL
HCT: 33 % — AB (ref 41–53)
Platelets: 233 10*3/uL (ref 150–399)
WBC: 9.4 10^3/mL

## 2013-03-16 LAB — BASIC METABOLIC PANEL
BUN: 55 mg/dL — AB (ref 4–21)
Creatinine: 2.3 mg/dL — AB (ref 0.6–1.3)
Glucose: 136 mg/dL

## 2013-03-16 LAB — HEPATIC FUNCTION PANEL: Bilirubin, Total: 0.4 mg/dL

## 2013-05-08 ENCOUNTER — Encounter: Payer: Self-pay | Admitting: Nurse Practitioner

## 2013-05-08 ENCOUNTER — Non-Acute Institutional Stay (SKILLED_NURSING_FACILITY): Payer: Medicare Other | Admitting: Nurse Practitioner

## 2013-05-08 DIAGNOSIS — E1129 Type 2 diabetes mellitus with other diabetic kidney complication: Secondary | ICD-10-CM

## 2013-05-08 DIAGNOSIS — N189 Chronic kidney disease, unspecified: Secondary | ICD-10-CM

## 2013-05-08 DIAGNOSIS — R413 Other amnesia: Secondary | ICD-10-CM

## 2013-05-08 DIAGNOSIS — I471 Supraventricular tachycardia: Secondary | ICD-10-CM

## 2013-05-08 DIAGNOSIS — K219 Gastro-esophageal reflux disease without esophagitis: Secondary | ICD-10-CM

## 2013-05-08 DIAGNOSIS — E1122 Type 2 diabetes mellitus with diabetic chronic kidney disease: Secondary | ICD-10-CM

## 2013-05-08 NOTE — Assessment & Plan Note (Signed)
Stable on Omeprazole 20mg bid. No worsening of s/s of GERD since off Reglan           

## 2013-05-08 NOTE — Progress Notes (Signed)
Patient ID: [name] [name], male   DOB: 1921/03/15, 79 y.o.   MRN: [id number]   Code Status: DNR  Allergies  Allergen Reactions  . Celebrex [Celecoxib]     Chief Complaint  Patient presents with  . Medical Managment of Chronic Issues    blood sugar  . Acute Visit    HPI: Patient is a 79 y.o. male seen in the SNF at [facility] today for evaluation of blood sugar and other chronic medical conditions.  Problem List Items Addressed This Visit   Memory loss     Mild abnormality on brief-MMSE. Family very aware of changes.          GERD (gastroesophageal reflux disease)     Stable on Omeprazole 20mg  bid. No worsening of s/s of GERD since off Reglan           Paroxysmal supraventricular tachycardia     Rate controlled, on Amiodarone 200mg  daily.                   Type 2 diabetes mellitus with diabetic chronic kidney disease - Primary     Long standing DMII with chronic kidney disease-creatinine 2s--update CMP and Hgb A1c. Takes Levemir 14 unit daily and Novolog 5 units with breakfast/dinner and 8 units with lunch fo CBG>150      Relevant Medications      insulin aspart (NOVOLOG) 100 UNIT/ML injection      Review of Systems:  Review of Systems  Constitutional: Negative for fever, chills, weight loss, malaise/fatigue and diaphoresis.  HENT: Positive for hearing loss. Negative for congestion, ear pain and sore throat.   Eyes: Negative for pain, discharge and redness.  Respiratory: Negative for cough, sputum production, shortness of breath and wheezing.   Cardiovascular: Positive for PND. Negative for chest pain, palpitations, orthopnea, claudication and leg swelling.       Resolved bradycardia-HR 60s  Gastrointestinal: Negative for heartburn, nausea, vomiting, abdominal pain, diarrhea and constipation.  Genitourinary: Positive for frequency. Negative for dysuria, urgency, hematuria and flank pain.  Musculoskeletal: Positive for back pain and joint  pain. Negative for falls, myalgias and neck pain.  Skin: Negative for itching and rash.       Scaly rash on scalp  Neurological: Negative for dizziness, tingling, tremors, sensory change, speech change, focal weakness, seizures, loss of consciousness, weakness and headaches.  Endo/Heme/Allergies: Negative for environmental allergies and polydipsia. Does not bruise/bleed easily.  Psychiatric/Behavioral: Positive for memory loss. Negative for depression and hallucinations. The patient is not nervous/anxious and does not have insomnia.      Past Medical History  Diagnosis Date  . CVA (cerebral infarction) 1981    MI  . Type II or unspecified type diabetes mellitus without mention of complication, not stated as uncontrolled   . Diverticulosis   . History of GI diverticular bleed 2007  . HTN (hypertension)   . AAA (abdominal aortic aneurysm)     CT 2006 3.2 cm  . Bladder cancer     Transitional cell carcinoma excised 2006  . Renal malignant tumor     transitional cell carcinoma lasered 2006  . Hyperlipidemia   . Hydrocele, left   . Inguinal hernia   . Coronary artery disease 05/05/2011    Echo EF 50%- 55% mild inferiof wall hypokinesis,mild dilatation left artium,mild mitral annular calcif. mild MR, mild TR, and mild calcif. /sclerosis trileaflet aortic valve w/ trace aortic insufficiency  . Dysrhythmia     af  . Anemia   .  GERD (gastroesophageal reflux disease)   . Arthritis   . Myocardial infarction 1981    inferior-wall MI   . Atrial fibrillation 07/04/2010    cardioversion - successful  normal sinus rhythm; May 2012 discontinue Coumadin  and increaseAspirin  . Peripheral vascular disease 11/13/2010    Abd aortic doppler mild abd aortic aneurysm 3.1 x 2.9cm  . Coronary artery disease 10/01/2009    Persantaine myview-EF 53% moderate perfusion d/t infaract/scar w/mild perinfaract ischemia seen Mid Anterior ,Mid Inferior, Apical Inferior , Basal and Mid inferolateral, Apical Lateral  region  . Hypopotassemia 06/22/2012  . Edema 06/22/2012  . Congestive heart failure, unspecified 06/13/2012  . Olecranon bursitis 02/03/2012  . Pneumonia, organism unspecified 01/20/2012  . Neoplasm of uncertain behavior of skin 10/19/2011  . Unspecified venous (peripheral) insufficiency 08/26/2011  . Unspecified constipation 07/29/2011  . Lumbago 07/09/2011  . Personal history of fall 06/25/2011  . Unspecified malignant neoplasm of scalp and skin of neck 06/11/2011  . Other acquired deformity of ankle and foot(736.79) 06/11/2011  . Other alteration of consciousness 06/11/2011  . Hyposmolality and/or hyponatremia 05/07/2011  . Major depressive disorder, single episode, unspecified 05/07/2011  . Abnormality of gait 04/27/2011  . Other B-complex deficiencies 04/09/2011  . Senile dementia, uncomplicated 04/09/2011  . Phlebitis and thrombophlebitis of other deep vessels of lower extremities 04/09/2011  . Hemorrhage of rectum and anus 04/09/2011  . Loss of weight 04/09/2011  . Anemia, unspecified 9/8/209  . Malignant neoplasm of bladder, part unspecified 04/08/2008  . Malignant neoplasm of kidney, except pelvis 04/08/2005   Past Surgical History  Procedure Laterality Date  . Coronary artery bypass graft  '94    LIMA-LAD, SVG-OM, PD, RCA  . Cataract extraction  2007  . Renal laser ablation of tumor  2006  . Coronary artery bypass graft  1994    LIMA to LAD, vein to the marginal, vein to the PDA, vein to the PLA   . Appendectomy    . Eye surgery    . Incision and drainage of wound  09/10/2011    Procedure: IRRIGATION AND DEBRIDEMENT WOUND;  Surgeon: Wayland Denis, DO;  Location:  [facility];  Service: Plastics;  Laterality: N/A;  irrigation and debridement scalp wound with removal of bone and placement of intregra or acell and vac   Social History:   reports that he has quit smoking. He does not have any smokeless tobacco history on file. He reports that he does not drink  alcohol or use illicit drugs.  Family History  Problem Relation Age of Onset  . Diabetes Mother   . Diabetes Daughter     Medications: Patient's Medications  New Prescriptions   No medications on file  Previous Medications   AMIODARONE (PACERONE) 200 MG TABLET    Take 200 mg by mouth daily. Take one tablet daily for heart.   ASPIRIN EC 325 MG TABLET    Take 81 mg by mouth daily.    ATORVASTATIN (LIPITOR) 10 MG TABLET    Take 10 mg by mouth every Monday, Wednesday, and Friday.    INSULIN ASPART (NOVOLOG) 100 UNIT/ML INJECTION    Inject 5-8 Units into the skin 3 (three) times daily before meals. 5units with breakfast and dinner, 8 units with lunch for CBG>150   INSULIN DETEMIR (LEVEMIR FLEXPEN) 100 UNIT/ML INJECTION    Inject 14 Units into the skin at bedtime.    NUTRITIONAL SUPPLEMENTS (BOOST DIABETIC) LIQD    Take by mouth. Drink 1 can daily.  OMEPRAZOLE (PRILOSEC) 20 MG CAPSULE    Take 20 mg by mouth daily.     VITAMIN B-12 (CYANOCOBALAMIN) 1000 MCG TABLET    Take 1,000 mcg by mouth daily.    Modified Medications   No medications on file  Discontinued Medications   INSULIN PEN NEEDLE (NOVOFINE) 32G X 6 MM MISC    as directed.      Physical Exam: Physical Exam  Constitutional: He is oriented to person, place, and time. He appears well-developed and well-nourished.  HENT:  Head: Normocephalic and atraumatic.  Eyes: Conjunctivae and EOM are normal. Pupils are equal, round, and reactive to light.  Neck: Normal range of motion. Neck supple. No JVD present. No thyromegaly present.  Cardiovascular: Normal rate.  An irregular rhythm present.  Murmur heard.  Systolic murmur is present with a grade of 2/6  Pulmonary/Chest: Effort normal. He has no wheezes. He has rales (bibailar dry rales). He exhibits no tenderness.  Abdominal: Soft. Bowel sounds are normal. There is no tenderness.  Musculoskeletal: Normal range of motion. He exhibits no edema and no tenderness.  Lymphadenopathy:     He has no cervical adenopathy.  Neurological: He is alert and oriented to person, place, and time. He has normal reflexes. No cranial nerve deficit. He exhibits normal muscle tone. Coordination normal.  Skin: Skin is warm and dry. No rash noted. No erythema.  Scalp scaly rash  Psychiatric: He has a normal mood and affect. His behavior is normal. Judgment and thought content normal.    Filed Vitals:   05/08/13 1725  BP: 154/63  Pulse: 59  Temp: 97.6 F (36.4 C)  TempSrc: Tympanic  Resp: 16      Labs reviewed: Basic Metabolic Panel:  Recent Labs  16/10/96  01/10/13 02/04/13 03/16/13  NA 139  < > 137 139 138  K 4.2  < > 5.1 4.5 4.4  BUN 53*  < > 58* 43* 55*  CREATININE 2.0*  < > 2.2* 2.2* 2.3*  TSH 2.97  --   --   --   --   < > = values in this interval not displayed. Liver Function Tests:  Recent Labs  10/11/12 03/16/13  AST 18 17  ALT 15 12  ALKPHOS 89 84   CBC:  Recent Labs  10/11/12 02/04/13 03/16/13  WBC 7.9 8.7 9.4  HGB 10.8* 10.8* 11.2*  HCT 32* 32* 33*  PLT 201 220 233   Lipid Panel:  Recent Labs  03/14/13  CHOL 196  HDL 37  LDLCALC 128  TRIG 045   Assessment/Plan Type 2 diabetes mellitus with diabetic chronic kidney disease Long standing DMII with chronic kidney disease-creatinine 2s--update CMP and Hgb A1c. Takes Levemir 14 unit daily and Novolog 5 units with breakfast/dinner and 8 units with lunch fo CBG>150    Paroxysmal supraventricular tachycardia Rate controlled, on Amiodarone 200mg  daily.                 GERD (gastroesophageal reflux disease) Stable on Omeprazole 20mg  bid. No worsening of s/s of GERD since off Reglan         Memory loss Mild abnormality on brief-MMSE. Family very aware of changes.          Family/ Staff Communication: observe the patient  Goals of Care: SNF  Labs/tests ordered: CBC, CMP, Hgb a1c

## 2013-05-08 NOTE — Assessment & Plan Note (Signed)
Rate controlled, on Amiodarone 200mg daily.                  

## 2013-05-08 NOTE — Assessment & Plan Note (Signed)
Mild abnormality on brief-MMSE. Family very aware of changes.

## 2013-05-08 NOTE — Assessment & Plan Note (Signed)
Long standing DMII with chronic kidney disease-creatinine 2s--update CMP and Hgb A1c. Takes Levemir 14 unit daily and Novolog 5 units with breakfast/dinner and 8 units with lunch fo CBG>150

## 2013-05-10 LAB — BASIC METABOLIC PANEL
BUN: 55 mg/dL — AB (ref 4–21)
CREATININE: 2.5 mg/dL — AB (ref 0.6–1.3)
Glucose: 157 mg/dL
POTASSIUM: 4.2 mmol/L (ref 3.4–5.3)
SODIUM: 135 mmol/L — AB (ref 137–147)

## 2013-05-10 LAB — CBC AND DIFFERENTIAL
HCT: 32 % — AB (ref 41–53)
Hemoglobin: 10.7 g/dL — AB (ref 13.5–17.5)
Platelets: 194 10*3/uL (ref 150–399)
WBC: 6 10^3/mL

## 2013-05-10 LAB — HEPATIC FUNCTION PANEL
ALK PHOS: 104 U/L (ref 25–125)
ALT: 15 U/L (ref 10–40)
AST: 18 U/L (ref 14–40)
Bilirubin, Total: 0.3 mg/dL

## 2013-05-10 LAB — HEMOGLOBIN A1C: HEMOGLOBIN A1C: 7.4 % — AB (ref 4.0–6.0)

## 2013-06-12 ENCOUNTER — Non-Acute Institutional Stay (SKILLED_NURSING_FACILITY): Payer: Medicare Other | Admitting: Nurse Practitioner

## 2013-06-12 ENCOUNTER — Encounter: Payer: Self-pay | Admitting: Nurse Practitioner

## 2013-06-12 DIAGNOSIS — N058 Unspecified nephritic syndrome with other morphologic changes: Secondary | ICD-10-CM

## 2013-06-12 DIAGNOSIS — D649 Anemia, unspecified: Secondary | ICD-10-CM

## 2013-06-12 DIAGNOSIS — I10 Essential (primary) hypertension: Secondary | ICD-10-CM

## 2013-06-12 DIAGNOSIS — I471 Supraventricular tachycardia, unspecified: Secondary | ICD-10-CM

## 2013-06-12 DIAGNOSIS — R001 Bradycardia, unspecified: Secondary | ICD-10-CM

## 2013-06-12 DIAGNOSIS — I498 Other specified cardiac arrhythmias: Secondary | ICD-10-CM

## 2013-06-12 DIAGNOSIS — K219 Gastro-esophageal reflux disease without esophagitis: Secondary | ICD-10-CM

## 2013-06-12 DIAGNOSIS — E1129 Type 2 diabetes mellitus with other diabetic kidney complication: Secondary | ICD-10-CM | POA: Insufficient documentation

## 2013-06-12 DIAGNOSIS — E785 Hyperlipidemia, unspecified: Secondary | ICD-10-CM

## 2013-06-12 NOTE — Assessment & Plan Note (Addendum)
Heart rate in 60s-takes Amiodarone 200mg  qd. Last TSH 2.97 09/2012.

## 2013-06-12 NOTE — Assessment & Plan Note (Signed)
Stable with Hgb 10-11, off Fe, takes Vit B12 1034mcg daily

## 2013-06-12 NOTE — Assessment & Plan Note (Signed)
Occasionally elevated SBP 140-150s--off antihypertensive agents.   

## 2013-06-12 NOTE — Assessment & Plan Note (Signed)
Stable on Omeprazole 20mg bid. No worsening of s/s of GERD since off Reglan           

## 2013-06-12 NOTE — Assessment & Plan Note (Signed)
Rate controlled, on Amiodarone 200mg  daily.

## 2013-06-12 NOTE — Assessment & Plan Note (Signed)
Hgb A1c 7.4 05/10/13, Bun/creat 55/2.48 05/10/13. Takes Levemir14 units and Novolog 5-8 units with meals.  

## 2013-06-12 NOTE — Progress Notes (Signed)
Patient ID: [name] [name], male   DOB: 26-Jan-1921, 80 y.o.   MRN: [id number]   Code Status: DNR  Allergies  Allergen Reactions  . Celebrex [Celecoxib]     Chief Complaint  Patient presents with  . Medical Managment of Chronic Issues    HPI: Patient is a 80 y.o. male seen in the SNF at [facility] today for evaluation of chronic medical conditions.  Problem List Items Addressed This Visit   HYPERLIPIDEMIA     LDL 128 03/14/13-takes Atorvastatin 10mg  3x/week.       HYPERTENSION     Occasionally elevated SBP 140-150s--off antihypertensive agents.      Anemia     Stable with Hgb 10-11, off Fe, takes Vit B12 1066mcg daily            GERD (gastroesophageal reflux disease)     Stable on Omeprazole 20mg  bid. No worsening of s/s of GERD since off Reglan             Paroxysmal supraventricular tachycardia     Rate controlled, on Amiodarone 200mg  daily.                     Bradycardia, sinus     Heart rate in 60s-takes Amiodarone 200mg  qd. Last TSH 2.97 09/2012.    Type II diabetes mellitus with renal manifestations - Primary     Hgb A1c 7.4 05/10/13, Bun/creat 55/2.48 05/10/13. Takes Levemir14 units and Novolog 5-8 units with meals.        Review of Systems:  Review of Systems  Constitutional: Negative for fever, chills, weight loss, malaise/fatigue and diaphoresis.  HENT: Positive for hearing loss. Negative for congestion, ear pain and sore throat.   Eyes: Negative for pain, discharge and redness.  Respiratory: Negative for cough, sputum production, shortness of breath and wheezing.   Cardiovascular: Positive for PND. Negative for chest pain, palpitations, orthopnea, claudication and leg swelling.       Resolved bradycardia-HR 60s  Gastrointestinal: Negative for heartburn, nausea, vomiting, abdominal pain, diarrhea and constipation.  Genitourinary: Positive for frequency. Negative for dysuria, urgency, hematuria and flank pain.    Musculoskeletal: Positive for back pain and joint pain. Negative for falls, myalgias and neck pain.  Skin: Negative for itching and rash.       Scaly rash on scalp  Neurological: Negative for dizziness, tingling, tremors, sensory change, speech change, focal weakness, seizures, loss of consciousness, weakness and headaches.  Endo/Heme/Allergies: Negative for environmental allergies and polydipsia. Does not bruise/bleed easily.  Psychiatric/Behavioral: Positive for memory loss. Negative for depression and hallucinations. The patient is not nervous/anxious and does not have insomnia.      Past Medical History  Diagnosis Date  . CVA (cerebral infarction) 1981    MI  . Type II or unspecified type diabetes mellitus without mention of complication, not stated as uncontrolled   . Diverticulosis   . History of GI diverticular bleed 2007  . HTN (hypertension)   . AAA (abdominal aortic aneurysm)     CT 2006 3.2 cm  . Bladder cancer     Transitional cell carcinoma excised 2006  . Renal malignant tumor     transitional cell carcinoma lasered 2006  . Hyperlipidemia   . Hydrocele, left   . Inguinal hernia   . Coronary artery disease 05/05/2011    Echo EF 50%- 55% mild inferiof wall hypokinesis,mild dilatation left artium,mild mitral annular calcif. mild MR, mild TR, and mild calcif. /sclerosis trileaflet aortic valve  w/ trace aortic insufficiency  . Dysrhythmia     af  . Anemia   . GERD (gastroesophageal reflux disease)   . Arthritis   . Myocardial infarction 1981    inferior-wall MI   . Atrial fibrillation 07/04/2010    cardioversion - successful  normal sinus rhythm; May 2012 discontinue Coumadin  and increaseAspirin  . Peripheral vascular disease 11/13/2010    Abd aortic doppler mild abd aortic aneurysm 3.1 x 2.9cm  . Coronary artery disease 10/01/2009    Persantaine myview-EF 53% moderate perfusion d/t infaract/scar w/mild perinfaract ischemia seen Mid Anterior ,Mid Inferior, Apical  Inferior , Basal and Mid inferolateral, Apical Lateral region  . Hypopotassemia 06/22/2012  . Edema 06/22/2012  . Congestive heart failure, unspecified 06/13/2012  . Olecranon bursitis 02/03/2012  . Pneumonia, organism unspecified 01/20/2012  . Neoplasm of uncertain behavior of skin 10/19/2011  . Unspecified venous (peripheral) insufficiency 08/26/2011  . Unspecified constipation 07/29/2011  . Lumbago 07/09/2011  . Personal history of fall 06/25/2011  . Unspecified malignant neoplasm of scalp and skin of neck 06/11/2011  . Other acquired deformity of ankle and foot(736.79) 06/11/2011  . Other alteration of consciousness 06/11/2011  . Hyposmolality and/or hyponatremia 05/07/2011  . Major depressive disorder, single episode, unspecified 05/07/2011  . Abnormality of gait 04/27/2011  . Other B-complex deficiencies 04/09/2011  . Senile dementia, uncomplicated 123XX123  . Phlebitis and thrombophlebitis of other deep vessels of lower extremities 04/09/2011  . Hemorrhage of rectum and anus 04/09/2011  . Loss of weight 04/09/2011  . Anemia, unspecified 9/8/209  . Malignant neoplasm of bladder, part unspecified 04/08/2008  . Malignant neoplasm of kidney, except pelvis 04/08/2005   Past Surgical History  Procedure Laterality Date  . Coronary artery bypass graft  '94    LIMA-LAD, SVG-OM, PD, RCA  . Cataract extraction  2007  . Renal laser ablation of tumor  2006  . Coronary artery bypass graft  1994    LIMA to LAD, vein to the marginal, vein to the PDA, vein to the PLA   . Appendectomy    . Eye surgery    . Incision and drainage of wound  09/10/2011    Procedure: IRRIGATION AND DEBRIDEMENT WOUND;  Surgeon: Theodoro Kos, DO;  Location: [place];  Service: Plastics;  Laterality: N/A;  irrigation and debridement scalp wound with removal of bone and placement of intregra or acell and vac   Social History:   reports that he has quit smoking. He does not have any smokeless  tobacco history on file. He reports that he does not drink alcohol or use illicit drugs.  Family History  Problem Relation Age of Onset  . Diabetes Mother   . Diabetes Daughter     Medications: Patient's Medications  New Prescriptions   No medications on file  Previous Medications   AMIODARONE (PACERONE) 200 MG TABLET    Take 200 mg by mouth daily. Take one tablet daily for heart.   ASPIRIN EC 325 MG TABLET    Take 81 mg by mouth daily.    ATORVASTATIN (LIPITOR) 10 MG TABLET    Take 10 mg by mouth every Monday, Wednesday, and Friday.    INSULIN ASPART (NOVOLOG) 100 UNIT/ML INJECTION    Inject 5-8 Units into the skin 3 (three) times daily before meals. 5units with breakfast and dinner, 8 units with lunch for CBG>150   INSULIN DETEMIR (LEVEMIR FLEXPEN) 100 UNIT/ML INJECTION    Inject 14 Units into the skin at bedtime.  NUTRITIONAL SUPPLEMENTS (BOOST DIABETIC) LIQD    Take by mouth. Drink 1 can daily.   OMEPRAZOLE (PRILOSEC) 20 MG CAPSULE    Take 20 mg by mouth daily.     VITAMIN B-12 (CYANOCOBALAMIN) 1000 MCG TABLET    Take 1,000 mcg by mouth daily.    Modified Medications   No medications on file  Discontinued Medications   No medications on file     Physical Exam: Physical Exam  Constitutional: He is oriented to person, place, and time. He appears well-developed and well-nourished.  HENT:  Head: Normocephalic and atraumatic.  Eyes: Conjunctivae and EOM are normal. Pupils are equal, round, and reactive to light.  Neck: Normal range of motion. Neck supple. No JVD present. No thyromegaly present.  Cardiovascular: Normal rate.  An irregular rhythm present.  Murmur heard.  Systolic murmur is present with a grade of 2/6  Pulmonary/Chest: Effort normal. He has no wheezes. He has rales (bibailar dry rales). He exhibits no tenderness.  Abdominal: Soft. Bowel sounds are normal. There is no tenderness.  Musculoskeletal: Normal range of motion. He exhibits no edema and no tenderness.    Lymphadenopathy:    He has no cervical adenopathy.  Neurological: He is alert and oriented to person, place, and time. He has normal reflexes. No cranial nerve deficit. He exhibits normal muscle tone. Coordination normal.  Skin: Skin is warm and dry. No rash noted. No erythema.  Scalp scaly rash  Psychiatric: He has a normal mood and affect. His behavior is normal. Judgment and thought content normal.    Filed Vitals:   06/12/13 1050  BP: 150/72  Pulse: 74  Temp: 97.9 F (36.6 C)  TempSrc: Tympanic  Resp: 22      Labs reviewed: Basic Metabolic Panel:  Recent Labs  10/11/12  02/04/13 03/16/13 05/10/13  NA 139  < > 139 138 135*  K 4.2  < > 4.5 4.4 4.2  BUN 53*  < > 43* 55* 55*  CREATININE 2.0*  < > 2.2* 2.3* 2.5*  TSH 2.97  --   --   --   --   < > = values in this interval not displayed. Liver Function Tests:  Recent Labs  10/11/12 03/16/13 05/10/13  AST 18 17 18   ALT 15 12 15   ALKPHOS 89 84 104   CBC:  Recent Labs  02/04/13 03/16/13 05/10/13  WBC 8.7 9.4 6.0  HGB 10.8* 11.2* 10.7*  HCT 32* 33* 32*  PLT 220 233 194   Lipid Panel:  Recent Labs  03/14/13  CHOL 196  HDL 37  LDLCALC 128  TRIG 157   Assessment/Plan Type II diabetes mellitus with renal manifestations Hgb A1c 7.4 05/10/13, Bun/creat 55/2.48 05/10/13. Takes Levemir14 units and Novolog 5-8 units with meals.   Bradycardia, sinus Heart rate in 60s-takes Amiodarone 200mg  qd. Last TSH 2.97 09/2012.  Paroxysmal supraventricular tachycardia Rate controlled, on Amiodarone 200mg  daily.                   GERD (gastroesophageal reflux disease) Stable on Omeprazole 20mg  bid. No worsening of s/s of GERD since off Reglan           Anemia Stable with Hgb 10-11, off Fe, takes Vit B12 1065mcg daily          HYPERTENSION Occasionally elevated SBP 140-150s--off antihypertensive agents.    HYPERLIPIDEMIA LDL 128 03/14/13-takes Atorvastatin 10mg  3x/week.        Family/ Staff Communication: observe the patient  Goals of  Care: SNF  Labs/tests ordered: none

## 2013-06-12 NOTE — Assessment & Plan Note (Signed)
LDL 128 03/14/13-takes Atorvastatin 10mg  3x/week.

## 2013-07-19 ENCOUNTER — Encounter: Payer: Self-pay | Admitting: Nurse Practitioner

## 2013-07-19 ENCOUNTER — Non-Acute Institutional Stay (SKILLED_NURSING_FACILITY): Payer: Medicare Other | Admitting: Nurse Practitioner

## 2013-07-19 DIAGNOSIS — N183 Chronic kidney disease, stage 3 unspecified: Secondary | ICD-10-CM

## 2013-07-19 DIAGNOSIS — K219 Gastro-esophageal reflux disease without esophagitis: Secondary | ICD-10-CM

## 2013-07-19 DIAGNOSIS — D649 Anemia, unspecified: Secondary | ICD-10-CM

## 2013-07-19 DIAGNOSIS — E1129 Type 2 diabetes mellitus with other diabetic kidney complication: Secondary | ICD-10-CM

## 2013-07-19 DIAGNOSIS — I471 Supraventricular tachycardia: Secondary | ICD-10-CM

## 2013-07-19 NOTE — Assessment & Plan Note (Signed)
Stable with Hgb 10.8 02/04/13(10.8 10/11/12)-10.7 05/10/13, off Fe, takes Vit B12 1016mcg daily

## 2013-07-19 NOTE — Progress Notes (Signed)
Patient ID: [name] [name], male   DOB: 05/19/1921, 80 y.o.   MRN: [id number]   Code Status: DNR  Allergies  Allergen Reactions  . Celebrex [Celecoxib]     Chief Complaint  Patient presents with  . Medical Managment of Chronic Issues    HPI: Patient is a 80 y.o. male seen in the SNF at [facility] today for evaluation of chronic medical conditions.  Problem List Items Addressed This Visit   Type II diabetes mellitus with renal manifestations - Primary     Long standing DMII with chronic kidney disease-creatinine 2s-Hgb A1c7.4 05/10/14.Takes Levemir 14 unit daily and Novolog 5 units with breakfast/dinner and 8 units with lunch fo CBG>150    Paroxysmal supraventricular tachycardia     Rate controlled, on Amiodarone 200mg  daily.       GERD (gastroesophageal reflux disease)     Stable on Omeprazole 20mg  bid. No worsening of s/s of GERD since off Reglan              Chronic renal disease, stage III (Chronic)     Last Bun/creat 55/2.48 05/10/13    Anemia     Stable with Hgb 10.8 02/04/13(10.8 10/11/12)-10.7 05/10/13, off Fe, takes Vit B12 1019mcg daily        Review of Systems:  Review of Systems  Constitutional: Negative for fever, chills, weight loss, malaise/fatigue and diaphoresis.  HENT: Positive for hearing loss. Negative for congestion, ear pain and sore throat.   Eyes: Negative for pain, discharge and redness.  Respiratory: Negative for cough, sputum production, shortness of breath and wheezing.   Cardiovascular: Positive for PND. Negative for chest pain, palpitations, orthopnea, claudication and leg swelling.       Resolved bradycardia-HR 60s  Gastrointestinal: Negative for heartburn, nausea, vomiting, abdominal pain, diarrhea and constipation.  Genitourinary: Positive for frequency. Negative for dysuria, urgency, hematuria and flank pain.  Musculoskeletal: Positive for back pain and joint pain. Negative for falls, myalgias and neck pain.  Skin:  Negative for itching and rash.       Scaly rash on scalp  Neurological: Negative for dizziness, tingling, tremors, sensory change, speech change, focal weakness, seizures, loss of consciousness, weakness and headaches.  Endo/Heme/Allergies: Negative for environmental allergies and polydipsia. Does not bruise/bleed easily.  Psychiatric/Behavioral: Positive for memory loss. Negative for depression and hallucinations. The patient is not nervous/anxious and does not have insomnia.      Past Medical History  Diagnosis Date  . CVA (cerebral infarction) 1981    MI  . Type II or unspecified type diabetes mellitus without mention of complication, not stated as uncontrolled   . Diverticulosis   . History of GI diverticular bleed 2007  . HTN (hypertension)   . AAA (abdominal aortic aneurysm)     CT 2006 3.2 cm  . Bladder cancer     Transitional cell carcinoma excised 2006  . Renal malignant tumor     transitional cell carcinoma lasered 2006  . Hyperlipidemia   . Hydrocele, left   . Inguinal hernia   . Coronary artery disease 05/05/2011    Echo EF 50%- 55% mild inferiof wall hypokinesis,mild dilatation left artium,mild mitral annular calcif. mild MR, mild TR, and mild calcif. /sclerosis trileaflet aortic valve w/ trace aortic insufficiency  . Dysrhythmia     af  . Anemia   . GERD (gastroesophageal reflux disease)   . Arthritis   . Myocardial infarction 1981    inferior-wall MI   . Atrial fibrillation 07/04/2010  cardioversion - successful  normal sinus rhythm; May 2012 discontinue Coumadin  and increaseAspirin  . Peripheral vascular disease 11/13/2010    Abd aortic doppler mild abd aortic aneurysm 3.1 x 2.9cm  . Coronary artery disease 10/01/2009    Persantaine myview-EF 53% moderate perfusion d/t infaract/scar w/mild perinfaract ischemia seen Mid Anterior ,Mid Inferior, Apical Inferior , Basal and Mid inferolateral, Apical Lateral region  . Hypopotassemia 06/22/2012  . Edema 06/22/2012   . Congestive heart failure, unspecified 06/13/2012  . Olecranon bursitis 02/03/2012  . Pneumonia, organism unspecified 01/20/2012  . Neoplasm of uncertain behavior of skin 10/19/2011  . Unspecified venous (peripheral) insufficiency 08/26/2011  . Unspecified constipation 07/29/2011  . Lumbago 07/09/2011  . Personal history of fall 06/25/2011  . Unspecified malignant neoplasm of scalp and skin of neck 06/11/2011  . Other acquired deformity of ankle and foot(736.79) 06/11/2011  . Other alteration of consciousness 06/11/2011  . Hyposmolality and/or hyponatremia 05/07/2011  . Major depressive disorder, single episode, unspecified 05/07/2011  . Abnormality of gait 04/27/2011  . Other B-complex deficiencies 04/09/2011  . Senile dementia, uncomplicated 123XX123  . Phlebitis and thrombophlebitis of other deep vessels of lower extremities 04/09/2011  . Hemorrhage of rectum and anus 04/09/2011  . Loss of weight 04/09/2011  . Anemia, unspecified 9/8/209  . Malignant neoplasm of bladder, part unspecified 04/08/2008  . Malignant neoplasm of kidney, except pelvis 04/08/2005   Past Surgical History  Procedure Laterality Date  . Coronary artery bypass graft  '94    LIMA-LAD, SVG-OM, PD, RCA  . Cataract extraction  2007  . Renal laser ablation of tumor  2006  . Coronary artery bypass graft  1994    LIMA to LAD, vein to the marginal, vein to the PDA, vein to the PLA   . Appendectomy    . Eye surgery    . Incision and drainage of wound  09/10/2011    Procedure: IRRIGATION AND DEBRIDEMENT WOUND;  Surgeon: Theodoro Kos, DO;  Location: [place];  Service: Plastics;  Laterality: N/A;  irrigation and debridement scalp wound with removal of bone and placement of intregra or acell and vac   Social History:   reports that he has quit smoking. He does not have any smokeless tobacco history on file. He reports that he does not drink alcohol or use illicit drugs.  Family History  Problem  Relation Age of Onset  . Diabetes Mother   . Diabetes Daughter     Medications: Patient's Medications  New Prescriptions   No medications on file  Previous Medications   AMIODARONE (PACERONE) 200 MG TABLET    Take 200 mg by mouth daily. Take one tablet daily for heart.   ASPIRIN EC 325 MG TABLET    Take 81 mg by mouth daily.    ATORVASTATIN (LIPITOR) 10 MG TABLET    Take 10 mg by mouth every Monday, Wednesday, and Friday.    INSULIN ASPART (NOVOLOG) 100 UNIT/ML INJECTION    Inject 5-8 Units into the skin 3 (three) times daily before meals. 5units with breakfast and dinner, 8 units with lunch for CBG>150   INSULIN DETEMIR (LEVEMIR FLEXPEN) 100 UNIT/ML INJECTION    Inject 14 Units into the skin at bedtime.    NUTRITIONAL SUPPLEMENTS (BOOST DIABETIC) LIQD    Take by mouth. Drink 1 can daily.   OMEPRAZOLE (PRILOSEC) 20 MG CAPSULE    Take 20 mg by mouth daily.     VITAMIN B-12 (CYANOCOBALAMIN) 1000 MCG TABLET  Take 1,000 mcg by mouth daily.    Modified Medications   No medications on file  Discontinued Medications   No medications on file     Physical Exam: Physical Exam  Constitutional: He is oriented to person, place, and time. He appears well-developed and well-nourished.  HENT:  Head: Normocephalic and atraumatic.  Eyes: Conjunctivae and EOM are normal. Pupils are equal, round, and reactive to light.  Neck: Normal range of motion. Neck supple. No JVD present. No thyromegaly present.  Cardiovascular: Normal rate.  An irregular rhythm present.  Murmur heard.  Systolic murmur is present with a grade of 2/6  Pulmonary/Chest: Effort normal. He has no wheezes. He has rales (bibailar dry rales). He exhibits no tenderness.  Abdominal: Soft. Bowel sounds are normal. There is no tenderness.  Musculoskeletal: Normal range of motion. He exhibits no edema and no tenderness.  Lymphadenopathy:    He has no cervical adenopathy.  Neurological: He is alert and oriented to person, place, and  time. He has normal reflexes. No cranial nerve deficit. He exhibits normal muscle tone. Coordination normal.  Skin: Skin is warm and dry. No rash noted. No erythema.  Scalp scaly rash  Psychiatric: He has a normal mood and affect. His behavior is normal. Judgment and thought content normal.    Filed Vitals:   07/19/13 1410  BP: 166/87  Pulse: 68  Temp: 97.6 F (36.4 C)  TempSrc: Tympanic  Resp: 16      Labs reviewed: Basic Metabolic Panel:  Recent Labs  10/11/12  02/04/13 03/16/13 05/10/13  NA 139  < > 139 138 135*  K 4.2  < > 4.5 4.4 4.2  BUN 53*  < > 43* 55* 55*  CREATININE 2.0*  < > 2.2* 2.3* 2.5*  TSH 2.97  --   --   --   --   < > = values in this interval not displayed. Liver Function Tests:  Recent Labs  10/11/12 03/16/13 05/10/13  AST 18 17 18   ALT 15 12 15   ALKPHOS 89 84 104   CBC:  Recent Labs  02/04/13 03/16/13 05/10/13  WBC 8.7 9.4 6.0  HGB 10.8* 11.2* 10.7*  HCT 32* 33* 32*  PLT 220 233 194   Lipid Panel:  Recent Labs  03/14/13  CHOL 196  HDL 37  LDLCALC 128  TRIG 157   Assessment/Plan Type II diabetes mellitus with renal manifestations Long standing DMII with chronic kidney disease-creatinine 2s-Hgb A1c7.4 05/10/14.Takes Levemir 14 unit daily and Novolog 5 units with breakfast/dinner and 8 units with lunch fo CBG>150  Paroxysmal supraventricular tachycardia Rate controlled, on Amiodarone 200mg  daily.     GERD (gastroesophageal reflux disease) Stable on Omeprazole 20mg  bid. No worsening of s/s of GERD since off Reglan            Anemia Stable with Hgb 10.8 02/04/13(10.8 10/11/12)-10.7 05/10/13, off Fe, takes Vit B12 1066mcg daily   Chronic renal disease, stage III Last Bun/creat 55/2.48 05/10/13    Family/ Staff Communication: observe the patient  Goals of Care: SNF  Labs/tests ordered: none

## 2013-07-19 NOTE — Assessment & Plan Note (Signed)
Long standing DMII with chronic kidney disease-creatinine 2s-Hgb A1c7.4 05/10/14.Takes Levemir 14 unit daily and Novolog 5 units with breakfast/dinner and 8 units with lunch fo CBG>150

## 2013-07-19 NOTE — Assessment & Plan Note (Signed)
Stable on Omeprazole 20mg  bid. No worsening of s/s of GERD since off Reglan

## 2013-07-19 NOTE — Assessment & Plan Note (Signed)
Rate controlled, on Amiodarone 200mg daily.                  

## 2013-07-19 NOTE — Assessment & Plan Note (Signed)
Last Bun/creat 55/2.48 05/10/13

## 2013-08-14 ENCOUNTER — Non-Acute Institutional Stay (SKILLED_NURSING_FACILITY): Payer: Medicare Other | Admitting: Nurse Practitioner

## 2013-08-14 ENCOUNTER — Encounter: Payer: Self-pay | Admitting: Nurse Practitioner

## 2013-08-14 DIAGNOSIS — N183 Chronic kidney disease, stage 3 unspecified: Secondary | ICD-10-CM

## 2013-08-14 DIAGNOSIS — R413 Other amnesia: Secondary | ICD-10-CM

## 2013-08-14 DIAGNOSIS — I10 Essential (primary) hypertension: Secondary | ICD-10-CM

## 2013-08-14 DIAGNOSIS — I471 Supraventricular tachycardia: Secondary | ICD-10-CM

## 2013-08-14 DIAGNOSIS — R05 Cough: Secondary | ICD-10-CM

## 2013-08-14 DIAGNOSIS — D649 Anemia, unspecified: Secondary | ICD-10-CM

## 2013-08-14 DIAGNOSIS — E119 Type 2 diabetes mellitus without complications: Secondary | ICD-10-CM

## 2013-08-14 DIAGNOSIS — R059 Cough, unspecified: Secondary | ICD-10-CM

## 2013-08-14 NOTE — Assessment & Plan Note (Signed)
Hgb A1c 7.4 05/10/13, Bun/creat 55/2.48 05/10/13. Takes Levemir14 units and Novolog 5-8 units with meals.

## 2013-08-14 NOTE — Assessment & Plan Note (Signed)
Family very aware of changes. More confused than usual. Obtain UA C/S

## 2013-08-14 NOTE — Assessment & Plan Note (Signed)
Stable with Hgb 10.8 02/04/13(10.8 10/11/12)-10.7 05/10/13, off Fe, takes Vit B12 1055mcg daily. Update CBC

## 2013-08-14 NOTE — Assessment & Plan Note (Signed)
Rate controlled, on Amiodarone 200mg daily.                  

## 2013-08-14 NOTE — Progress Notes (Signed)
Patient ID: [name] [name], male   DOB: 12-15-1920, 80 y.o.   MRN: [id number]   Code Status: DNR  Allergies  Allergen Reactions  . Celebrex [Celecoxib]     Chief Complaint  Patient presents with  . Medical Managment of Chronic Issues  . Acute Visit    cough, increased confusion.     HPI: Patient is a 80 y.o. male seen in the SNF at [facility] today for evaluation of cough, increased confusion,  and other chronic medical conditions.  Problem List Items Addressed This Visit   Paroxysmal supraventricular tachycardia     Rate controlled, on Amiodarone 200mg  daily.        Memory loss      Family very aware of changes. More confused than usual. Obtain UA C/S      HYPERTENSION     Occasionally elevated SBP 140-150s--off antihypertensive agents.      DIABETES MELLITUS, TYPE II (Chronic)     Hgb A1c 7.4 05/10/13, Bun/creat 55/2.48 05/10/13. Takes Levemir14 units and Novolog 5-8 units with meals.     Cough     tussionex 33ml q12 x 1wk. CXR no pulmonary vascular congestion, no inflammatory consolidate.     Chronic renal disease, stage III - Primary (Chronic)     Last Bun/creat 55/2.48 05/10/13, update CMP     Anemia     Stable with Hgb 10.8 02/04/13(10.8 10/11/12)-10.7 05/10/13, off Fe, takes Vit B12 1044mcg daily. Update CBC         Review of Systems:  Review of Systems  Constitutional: Negative for fever, chills, weight loss, malaise/fatigue and diaphoresis.  HENT: Positive for hearing loss. Negative for congestion, ear pain and sore throat.   Eyes: Negative for pain, discharge and redness.  Respiratory: Positive for cough. Negative for sputum production, shortness of breath and wheezing.   Cardiovascular: Positive for PND. Negative for chest pain, palpitations, orthopnea, claudication and leg swelling.       Resolved bradycardia-HR 60s  Gastrointestinal: Negative for heartburn, nausea, vomiting, abdominal pain, diarrhea and constipation.  Genitourinary:  Positive for frequency. Negative for dysuria, urgency, hematuria and flank pain.  Musculoskeletal: Positive for back pain and joint pain. Negative for falls, myalgias and neck pain.  Skin: Negative for itching and rash.       Scaly rash on scalp  Neurological: Negative for dizziness, tingling, tremors, sensory change, speech change, focal weakness, seizures, loss of consciousness, weakness and headaches.  Endo/Heme/Allergies: Negative for environmental allergies and polydipsia. Does not bruise/bleed easily.  Psychiatric/Behavioral: Positive for memory loss. Negative for depression and hallucinations. The patient is not nervous/anxious and does not have insomnia.        Increased confusion     Past Medical History  Diagnosis Date  . CVA (cerebral infarction) 1981    MI  . Type II or unspecified type diabetes mellitus without mention of complication, not stated as uncontrolled   . Diverticulosis   . History of GI diverticular bleed 2007  . HTN (hypertension)   . AAA (abdominal aortic aneurysm)     CT 2006 3.2 cm  . Bladder cancer     Transitional cell carcinoma excised 2006  . Renal malignant tumor     transitional cell carcinoma lasered 2006  . Hyperlipidemia   . Hydrocele, left   . Inguinal hernia   . Coronary artery disease 05/05/2011    Echo EF 50%- 55% mild inferiof wall hypokinesis,mild dilatation left artium,mild mitral annular calcif. mild MR, mild TR, and mild  calcif. /sclerosis trileaflet aortic valve w/ trace aortic insufficiency  . Dysrhythmia     af  . Anemia   . GERD (gastroesophageal reflux disease)   . Arthritis   . Myocardial infarction 1981    inferior-wall MI   . Atrial fibrillation 07/04/2010    cardioversion - successful  normal sinus rhythm; May 2012 discontinue Coumadin  and increaseAspirin  . Peripheral vascular disease 11/13/2010    Abd aortic doppler mild abd aortic aneurysm 3.1 x 2.9cm  . Coronary artery disease 10/01/2009    Persantaine myview-EF 53%  moderate perfusion d/t infaract/scar w/mild perinfaract ischemia seen Mid Anterior ,Mid Inferior, Apical Inferior , Basal and Mid inferolateral, Apical Lateral region  . Hypopotassemia 06/22/2012  . Edema 06/22/2012  . Congestive heart failure, unspecified 06/13/2012  . Olecranon bursitis 02/03/2012  . Pneumonia, organism unspecified 01/20/2012  . Neoplasm of uncertain behavior of skin 10/19/2011  . Unspecified venous (peripheral) insufficiency 08/26/2011  . Unspecified constipation 07/29/2011  . Lumbago 07/09/2011  . Personal history of fall 06/25/2011  . Unspecified malignant neoplasm of scalp and skin of neck 06/11/2011  . Other acquired deformity of ankle and foot(736.79) 06/11/2011  . Other alteration of consciousness 06/11/2011  . Hyposmolality and/or hyponatremia 05/07/2011  . Major depressive disorder, single episode, unspecified 05/07/2011  . Abnormality of gait 04/27/2011  . Other B-complex deficiencies 04/09/2011  . Senile dementia, uncomplicated 04/09/2011  . Phlebitis and thrombophlebitis of other deep vessels of lower extremities 04/09/2011  . Hemorrhage of rectum and anus 04/09/2011  . Loss of weight 04/09/2011  . Anemia, unspecified 9/8/209  . Malignant neoplasm of bladder, part unspecified 04/08/2008  . Malignant neoplasm of kidney, except pelvis 04/08/2005   Past Surgical History  Procedure Laterality Date  . Coronary artery bypass graft  '94    LIMA-LAD, SVG-OM, PD, RCA  . Cataract extraction  2007  . Renal laser ablation of tumor  2006  . Coronary artery bypass graft  1994    LIMA to LAD, vein to the marginal, vein to the PDA, vein to the PLA   . Appendectomy    . Eye surgery    . Incision and drainage of wound  09/10/2011    Procedure: IRRIGATION AND DEBRIDEMENT WOUND;  Surgeon: Wayland Denis, DO;  Location: [facility];  Service: Plastics;  Laterality: N/A;  irrigation and debridement scalp wound with removal of bone and placement of intregra or  acell and vac   Social History:   reports that he has quit smoking. He does not have any smokeless tobacco history on file. He reports that he does not drink alcohol or use illicit drugs.  Family History  Problem Relation Age of Onset  . Diabetes Mother   . Diabetes Daughter     Medications: Patient's Medications  New Prescriptions   No medications on file  Previous Medications   AMIODARONE (PACERONE) 200 MG TABLET    Take 200 mg by mouth daily. Take one tablet daily for heart.   ASPIRIN EC 325 MG TABLET    Take 81 mg by mouth daily.    ATORVASTATIN (LIPITOR) 10 MG TABLET    Take 10 mg by mouth every Monday, Wednesday, and Friday.    INSULIN ASPART (NOVOLOG) 100 UNIT/ML INJECTION    Inject 5-8 Units into the skin 3 (three) times daily before meals. 5units with breakfast and dinner, 8 units with lunch for CBG>150   INSULIN DETEMIR (LEVEMIR FLEXPEN) 100 UNIT/ML INJECTION    Inject 14 Units into  the skin at bedtime.    NUTRITIONAL SUPPLEMENTS (BOOST DIABETIC) LIQD    Take by mouth. Drink 1 can daily.   OMEPRAZOLE (PRILOSEC) 20 MG CAPSULE    Take 20 mg by mouth daily.     VITAMIN B-12 (CYANOCOBALAMIN) 1000 MCG TABLET    Take 1,000 mcg by mouth daily.    Modified Medications   No medications on file  Discontinued Medications   No medications on file     Physical Exam: Physical Exam  Constitutional: He is oriented to person, place, and time. He appears well-developed and well-nourished.  HENT:  Head: Normocephalic and atraumatic.  Eyes: Conjunctivae and EOM are normal. Pupils are equal, round, and reactive to light.  Neck: Normal range of motion. Neck supple. No JVD present. No thyromegaly present.  Cardiovascular: Normal rate.  An irregular rhythm present.  Murmur heard.  Systolic murmur is present with a grade of 2/6  Pulmonary/Chest: Effort normal. He has no wheezes. He has rales (bibailar dry rales). He exhibits no tenderness.  Abdominal: Soft. Bowel sounds are normal. There  is no tenderness.  Musculoskeletal: Normal range of motion. He exhibits no edema and no tenderness.  Lymphadenopathy:    He has no cervical adenopathy.  Neurological: He is alert and oriented to person, place, and time. He has normal reflexes. No cranial nerve deficit. He exhibits normal muscle tone. Coordination normal.  Skin: Skin is warm and dry. No rash noted. No erythema.  Scalp scaly rash  Psychiatric: He has a normal mood and affect. His behavior is normal. Judgment and thought content normal.    Filed Vitals:   08/14/13 1659  BP: 160/70  Pulse: 66  Temp: 98.9 F (37.2 C)  TempSrc: Tympanic  Resp: 20      Labs reviewed: Basic Metabolic Panel:  Recent Labs  10/11/12  02/04/13 03/16/13 05/10/13  NA 139  < > 139 138 135*  K 4.2  < > 4.5 4.4 4.2  BUN 53*  < > 43* 55* 55*  CREATININE 2.0*  < > 2.2* 2.3* 2.5*  TSH 2.97  --   --   --   --   < > = values in this interval not displayed. Liver Function Tests:  Recent Labs  10/11/12 03/16/13 05/10/13  AST 18 17 18   ALT 15 12 15   ALKPHOS 89 84 104   CBC:  Recent Labs  02/04/13 03/16/13 05/10/13  WBC 8.7 9.4 6.0  HGB 10.8* 11.2* 10.7*  HCT 32* 33* 32*  PLT 220 233 194   Lipid Panel:  Recent Labs  03/14/13  CHOL 196  HDL 37  LDLCALC 128  TRIG 157    Past Procedures:  08/09/13 CXR minimal cardiomegaly appears new without pulmonary vascular congestion or pleural effusion, mild elevation left hemidiaphragm unchanged, no inflammatory consolidate or suspicious nodule, mild atelectasis right lung base and left perihilar region.   Assessment/Plan Chronic renal disease, stage III Last Bun/creat 55/2.48 05/10/13, update CMP   Anemia Stable with Hgb 10.8 02/04/13(10.8 10/11/12)-10.7 05/10/13, off Fe, takes Vit B12 1068mcg daily. Update CBC    Memory loss  Family very aware of changes. More confused than usual. Obtain UA C/S    HYPERTENSION Occasionally elevated SBP 140-150s--off antihypertensive  agents.    DIABETES MELLITUS, TYPE II Hgb A1c 7.4 05/10/13, Bun/creat 55/2.48 05/10/13. Takes Levemir14 units and Novolog 5-8 units with meals.   Paroxysmal supraventricular tachycardia Rate controlled, on Amiodarone 200mg  daily.      Cough tussionex 16ml q12 x  1wk. CXR no pulmonary vascular congestion, no inflammatory consolidate.     Family/ Staff Communication: observe the patient  Goals of Care: SNF  Labs/tests ordered: CXR done 08/09/13. CBC, CMP, UA C/S in am.

## 2013-08-14 NOTE — Assessment & Plan Note (Signed)
Last Bun/creat 55/2.48 05/10/13, update CMP

## 2013-08-14 NOTE — Assessment & Plan Note (Signed)
tussionex 5ml q12 x 1wk. CXR no pulmonary vascular congestion, no inflammatory consolidate.

## 2013-08-14 NOTE — Assessment & Plan Note (Signed)
Occasionally elevated SBP 140-150s--off antihypertensive agents.   

## 2013-08-15 LAB — BASIC METABOLIC PANEL
BUN: 49 mg/dL — AB (ref 4–21)
Creatinine: 2.5 mg/dL — AB (ref 0.6–1.3)
Glucose: 64 mg/dL
POTASSIUM: 4.3 mmol/L (ref 3.4–5.3)
Sodium: 138 mmol/L (ref 137–147)

## 2013-08-15 LAB — CBC AND DIFFERENTIAL
HEMATOCRIT: 30 % — AB (ref 41–53)
Hemoglobin: 10 g/dL — AB (ref 13.5–17.5)
Platelets: 283 10*3/uL (ref 150–399)
WBC: 8.9 10^3/mL

## 2013-08-15 LAB — HEPATIC FUNCTION PANEL
ALK PHOS: 87 U/L (ref 25–125)
ALT: 13 U/L (ref 10–40)
AST: 15 U/L (ref 14–40)
BILIRUBIN, TOTAL: 0.5 mg/dL

## 2013-09-11 ENCOUNTER — Encounter: Payer: Self-pay | Admitting: Nurse Practitioner

## 2013-09-11 ENCOUNTER — Non-Acute Institutional Stay (SKILLED_NURSING_FACILITY): Payer: Medicare Other | Admitting: Nurse Practitioner

## 2013-09-11 DIAGNOSIS — E538 Deficiency of other specified B group vitamins: Secondary | ICD-10-CM

## 2013-09-11 DIAGNOSIS — N183 Chronic kidney disease, stage 3 unspecified: Secondary | ICD-10-CM

## 2013-09-11 DIAGNOSIS — I2510 Atherosclerotic heart disease of native coronary artery without angina pectoris: Secondary | ICD-10-CM

## 2013-09-11 DIAGNOSIS — I471 Supraventricular tachycardia, unspecified: Secondary | ICD-10-CM

## 2013-09-11 DIAGNOSIS — K219 Gastro-esophageal reflux disease without esophagitis: Secondary | ICD-10-CM

## 2013-09-11 DIAGNOSIS — E1129 Type 2 diabetes mellitus with other diabetic kidney complication: Secondary | ICD-10-CM

## 2013-09-11 DIAGNOSIS — I10 Essential (primary) hypertension: Secondary | ICD-10-CM

## 2013-09-11 DIAGNOSIS — K5900 Constipation, unspecified: Secondary | ICD-10-CM

## 2013-09-11 NOTE — Assessment & Plan Note (Signed)
Occasional c/o indigestion, takes Omeprazole 20mg bid. No worsening of s/s of GERD since off Reglan   

## 2013-09-11 NOTE — Assessment & Plan Note (Signed)
Stable, takes Colace 100mg bid.   

## 2013-09-11 NOTE — Progress Notes (Signed)
Patient ID: [name] [name], male   DOB: 1920/08/15, 80 y.o.   MRN: [id number]   Code Status: DNR  Allergies  Allergen Reactions  . Celebrex [Celecoxib]     Chief Complaint  Patient presents with  . Medical Managment of Chronic Issues    HPI: Patient is a 80 y.o. male seen in the SNF at [facility] today for evaluation of chronic medical conditions.  Problem List Items Addressed This Visit   Chronic renal disease, stage III (Chronic)     Bun/creat 55/2.48 05/10/13 and 59/2.49 08/15/13      CORONARY ARTERY DISEASE     No c/o angina. Takes Atorvastatin 10mg  and daily ASA 81mg  for risk reduction.       GERD (gastroesophageal reflux disease) - Primary     Occasional c/o indigestion, takes Omeprazole 20mg  bid. No worsening of s/s of GERD since off Reglan     HYPERTENSION     Occasionally elevated SBP 140-150s--off antihypertensive agents.       Paroxysmal supraventricular tachycardia     Rate controlled, on Amiodarone 200mg  daily. Check TSH    Type II diabetes mellitus with renal manifestations     Hgb A1c 7.4 05/10/13, Bun/creat 55/2.48 05/10/13. Takes Levemir14 units and Novolog 5-8 units with meals.      Unspecified constipation     Stable, takes Colace 100mg  bid.     Vitamin B 12 deficiency     Stable with Hgb 10.8 02/04/13(10.8 10/11/12)-10.7 05/10/13-10.0 08/15/13, off Fe, takes Vit B12 1072mcg daily         Review of Systems:  Review of Systems  Constitutional: Negative for fever, chills, weight loss, malaise/fatigue and diaphoresis.  HENT: Positive for hearing loss. Negative for congestion, ear pain and sore throat.   Eyes: Negative for pain, discharge and redness.  Respiratory: Negative for cough, sputum production, shortness of breath and wheezing.   Cardiovascular: Positive for PND. Negative for chest pain, palpitations, orthopnea, claudication and leg swelling.       Resolved bradycardia-HR 60s  Gastrointestinal: Positive for heartburn. Negative  for nausea, vomiting, abdominal pain, diarrhea and constipation.       Occasionally.   Genitourinary: Positive for frequency. Negative for dysuria, urgency, hematuria and flank pain.  Musculoskeletal: Positive for back pain and joint pain. Negative for falls, myalgias and neck pain.  Skin: Negative for itching and rash.       Scaly rash on scalp  Neurological: Negative for dizziness, tingling, tremors, sensory change, speech change, focal weakness, seizures, loss of consciousness, weakness and headaches.  Endo/Heme/Allergies: Negative for environmental allergies and polydipsia. Does not bruise/bleed easily.  Psychiatric/Behavioral: Positive for memory loss. Negative for depression and hallucinations. The patient is not nervous/anxious and does not have insomnia.        Increased confusion     Past Medical History  Diagnosis Date  . CVA (cerebral infarction) 1981    MI  . Type II or unspecified type diabetes mellitus without mention of complication, not stated as uncontrolled   . Diverticulosis   . History of GI diverticular bleed 2007  . HTN (hypertension)   . AAA (abdominal aortic aneurysm)     CT 2006 3.2 cm  . Bladder cancer     Transitional cell carcinoma excised 2006  . Renal malignant tumor     transitional cell carcinoma lasered 2006  . Hyperlipidemia   . Hydrocele, left   . Inguinal hernia   . Coronary artery disease 05/05/2011    Echo  EF 50%- 55% mild inferiof wall hypokinesis,mild dilatation left artium,mild mitral annular calcif. mild MR, mild TR, and mild calcif. /sclerosis trileaflet aortic valve w/ trace aortic insufficiency  . Dysrhythmia     af  . Anemia   . GERD (gastroesophageal reflux disease)   . Arthritis   . Myocardial infarction 1981    inferior-wall MI   . Atrial fibrillation 07/04/2010    cardioversion - successful  normal sinus rhythm; May 2012 discontinue Coumadin  and increaseAspirin  . Peripheral vascular disease 11/13/2010    Abd aortic doppler  mild abd aortic aneurysm 3.1 x 2.9cm  . Coronary artery disease 10/01/2009    Persantaine myview-EF 53% moderate perfusion d/t infaract/scar w/mild perinfaract ischemia seen Mid Anterior ,Mid Inferior, Apical Inferior , Basal and Mid inferolateral, Apical Lateral region  . Hypopotassemia 06/22/2012  . Edema 06/22/2012  . Congestive heart failure, unspecified 06/13/2012  . Olecranon bursitis 02/03/2012  . Pneumonia, organism unspecified 01/20/2012  . Neoplasm of uncertain behavior of skin 10/19/2011  . Unspecified venous (peripheral) insufficiency 08/26/2011  . Unspecified constipation 07/29/2011  . Lumbago 07/09/2011  . Personal history of fall 06/25/2011  . Unspecified malignant neoplasm of scalp and skin of neck 06/11/2011  . Other acquired deformity of ankle and foot(736.79) 06/11/2011  . Other alteration of consciousness 06/11/2011  . Hyposmolality and/or hyponatremia 05/07/2011  . Major depressive disorder, single episode, unspecified 05/07/2011  . Abnormality of gait 04/27/2011  . Other B-complex deficiencies 04/09/2011  . Senile dementia, uncomplicated 95/18/8416  . Phlebitis and thrombophlebitis of other deep vessels of lower extremities 04/09/2011  . Hemorrhage of rectum and anus 04/09/2011  . Loss of weight 04/09/2011  . Anemia, unspecified 9/8/209  . Malignant neoplasm of bladder, part unspecified 04/08/2008  . Malignant neoplasm of kidney, except pelvis 04/08/2005   Past Surgical History  Procedure Laterality Date  . Coronary artery bypass graft  '94    LIMA-LAD, SVG-OM, PD, RCA  . Cataract extraction  2007  . Renal laser ablation of tumor  2006  . Coronary artery bypass graft  1994    LIMA to LAD, vein to the marginal, vein to the PDA, vein to the PLA   . Appendectomy    . Eye surgery    . Incision and drainage of wound  09/10/2011    Procedure: IRRIGATION AND DEBRIDEMENT WOUND;  Surgeon: Theodoro Kos, DO;  Location: [place];  Service: Plastics;   Laterality: N/A;  irrigation and debridement scalp wound with removal of bone and placement of intregra or acell and vac   Social History:   reports that he has quit smoking. He does not have any smokeless tobacco history on file. He reports that he does not drink alcohol or use illicit drugs.  Family History  Problem Relation Age of Onset  . Diabetes Mother   . Diabetes Daughter     Medications: Patient's Medications  New Prescriptions   No medications on file  Previous Medications   AMIODARONE (PACERONE) 200 MG TABLET    Take 200 mg by mouth daily. Take one tablet daily for heart.   ASPIRIN EC 325 MG TABLET    Take 81 mg by mouth daily.    ATORVASTATIN (LIPITOR) 10 MG TABLET    Take 10 mg by mouth every Monday, Wednesday, and Friday.    INSULIN ASPART (NOVOLOG) 100 UNIT/ML INJECTION    Inject 5-8 Units into the skin 3 (three) times daily before meals. 5units with breakfast and dinner, 8 units with  lunch for CBG>150   INSULIN DETEMIR (LEVEMIR FLEXPEN) 100 UNIT/ML INJECTION    Inject 14 Units into the skin at bedtime.    NUTRITIONAL SUPPLEMENTS (BOOST DIABETIC) LIQD    Take by mouth. Drink 1 can daily.   OMEPRAZOLE (PRILOSEC) 20 MG CAPSULE    Take 20 mg by mouth daily.     VITAMIN B-12 (CYANOCOBALAMIN) 1000 MCG TABLET    Take 1,000 mcg by mouth daily.    Modified Medications   No medications on file  Discontinued Medications   No medications on file     Physical Exam: Physical Exam  Constitutional: He is oriented to person, place, and time. He appears well-developed and well-nourished.  HENT:  Head: Normocephalic and atraumatic.  Eyes: Conjunctivae and EOM are normal. Pupils are equal, round, and reactive to light.  Neck: Normal range of motion. Neck supple. No JVD present. No thyromegaly present.  Cardiovascular: Normal rate.  An irregular rhythm present.  Murmur heard.  Systolic murmur is present with a grade of 2/6  Pulmonary/Chest: Effort normal. He has no wheezes. He has  rales (bibailar dry rales). He exhibits no tenderness.  Abdominal: Soft. Bowel sounds are normal. There is no tenderness.  Musculoskeletal: Normal range of motion. He exhibits no edema and no tenderness.  Lymphadenopathy:    He has no cervical adenopathy.  Neurological: He is alert and oriented to person, place, and time. He has normal reflexes. No cranial nerve deficit. He exhibits normal muscle tone. Coordination normal.  Skin: Skin is warm and dry. No rash noted. No erythema.  Scalp scaly rash  Psychiatric: He has a normal mood and affect. His behavior is normal. Judgment and thought content normal.    Filed Vitals:   09/11/13 0922  BP: 150/70  Pulse: 82  Temp: 98.4 F (36.9 C)  TempSrc: Tympanic  Resp: 18   Labs reviewed: Basic Metabolic Panel:  Recent Labs  10/11/12  03/16/13 05/10/13 08/15/13  NA 139  < > 138 135* 138  K 4.2  < > 4.4 4.2 4.3  BUN 53*  < > 55* 55* 49*  CREATININE 2.0*  < > 2.3* 2.5* 2.5*  TSH 2.97  --   --   --   --   < > = values in this interval not displayed. Liver Function Tests:  Recent Labs  03/16/13 05/10/13 08/15/13  AST 17 18 15   ALT 12 15 13   ALKPHOS 84 104 87   CBC:  Recent Labs  03/16/13 05/10/13 08/15/13  WBC 9.4 6.0 8.9  HGB 11.2* 10.7* 10.0*  HCT 33* 32* 30*  PLT 233 194 283   Lipid Panel:  Recent Labs  03/14/13  CHOL 196  HDL 37  LDLCALC 128  TRIG 157    Past Procedures:  08/09/13 CXR minimal cardiomegaly appears new without pulmonary vascular congestion or pleural effusion, mild elevation left hemidiaphragm unchanged, no inflammatory consolidate or suspicious nodule, mild atelectasis right lung base and left perihilar region.   Assessment/Plan GERD (gastroesophageal reflux disease) Occasional c/o indigestion, takes Omeprazole 20mg  bid. No worsening of s/s of GERD since off Reglan   HYPERTENSION Occasionally elevated SBP 140-150s--off antihypertensive agents.     Chronic renal disease, stage III Bun/creat  55/2.48 05/10/13 and 59/2.49 08/15/13    Type II diabetes mellitus with renal manifestations Hgb A1c 7.4 05/10/13, Bun/creat 55/2.48 05/10/13. Takes Levemir14 units and Novolog 5-8 units with meals.    Vitamin B 12 deficiency Stable with Hgb 10.8 02/04/13(10.8 10/11/12)-10.7 05/10/13-10.0 08/15/13, off  Fe, takes Vit B12 1071mcg daily    Unspecified constipation Stable, takes Colace 100mg  bid.   Paroxysmal supraventricular tachycardia Rate controlled, on Amiodarone 200mg  daily. Check TSH  CORONARY ARTERY DISEASE No c/o angina. Takes Atorvastatin 10mg  and daily ASA 81mg  for risk reduction.       Family/ Staff Communication: observe the patient  Goals of Care: SNF  Labs/tests ordered: TSH

## 2013-09-11 NOTE — Assessment & Plan Note (Signed)
Hgb A1c 7.4 05/10/13, Bun/creat 55/2.48 05/10/13. Takes Levemir14 units and Novolog 5-8 units with meals.

## 2013-09-11 NOTE — Assessment & Plan Note (Signed)
No c/o angina. Takes Atorvastatin 10mg  and daily ASA 81mg  for risk reduction.

## 2013-09-11 NOTE — Assessment & Plan Note (Signed)
Occasionally elevated SBP 140-150s--off antihypertensive agents.   

## 2013-09-11 NOTE — Assessment & Plan Note (Signed)
Bun/creat 55/2.48 05/10/13 and 59/2.49 08/15/13   

## 2013-09-11 NOTE — Assessment & Plan Note (Signed)
Rate controlled, on Amiodarone 200mg  daily. Check TSH

## 2013-09-11 NOTE — Assessment & Plan Note (Signed)
Stable with Hgb 10.8 02/04/13(10.8 10/11/12)-10.7 05/10/13-10.0 08/15/13, off Fe, takes Vit B12 1070mcg daily

## 2013-09-12 LAB — TSH: TSH: 5.15 u[IU]/mL (ref 0.41–5.90)

## 2013-09-13 ENCOUNTER — Encounter: Payer: Self-pay | Admitting: Nurse Practitioner

## 2013-09-13 ENCOUNTER — Non-Acute Institutional Stay (SKILLED_NURSING_FACILITY): Payer: Medicare Other | Admitting: Nurse Practitioner

## 2013-09-13 DIAGNOSIS — E119 Type 2 diabetes mellitus without complications: Secondary | ICD-10-CM

## 2013-09-13 DIAGNOSIS — K219 Gastro-esophageal reflux disease without esophagitis: Secondary | ICD-10-CM

## 2013-09-13 DIAGNOSIS — D649 Anemia, unspecified: Secondary | ICD-10-CM

## 2013-09-13 DIAGNOSIS — R413 Other amnesia: Secondary | ICD-10-CM

## 2013-09-13 DIAGNOSIS — K5900 Constipation, unspecified: Secondary | ICD-10-CM

## 2013-09-13 DIAGNOSIS — I471 Supraventricular tachycardia: Secondary | ICD-10-CM

## 2013-09-13 DIAGNOSIS — E039 Hypothyroidism, unspecified: Secondary | ICD-10-CM

## 2013-09-13 DIAGNOSIS — I10 Essential (primary) hypertension: Secondary | ICD-10-CM

## 2013-09-13 DIAGNOSIS — N183 Chronic kidney disease, stage 3 unspecified: Secondary | ICD-10-CM

## 2013-09-13 NOTE — Progress Notes (Signed)
Patient ID: [name] [name], male   DOB: 15-May-1921, 80 y.o.   MRN: [id number]   Code Status: DNR  Allergies  Allergen Reactions  . Celebrex [Celecoxib]     Chief Complaint  Patient presents with  . Medical Managment of Chronic Issues  . Acute Visit    thyroid    HPI: Patient is a 80 y.o. male seen in the SNF at [facility] today for evaluation of hypothyroidism and chronic medical conditions.  Problem List Items Addressed This Visit   Anemia     Stable with Hgb 10s off Fe, takes Vit B12 1058mcg daily.       Chronic renal disease, stage III (Chronic)     Bun/creat 55/2.48 05/10/13 and 59/2.49 08/15/13     DIABETES MELLITUS, TYPE II (Chronic)     Hgb A1c 7.4 05/10/13, Bun/creat 55/2.48 05/10/13. Takes Levemir14 units and Novolog 5-8 units with meals.       GERD (gastroesophageal reflux disease)     Occasional c/o indigestion, takes Omeprazole 20mg  bid. No worsening of s/s of GERD since off Reglan      HYPERTENSION     Occasionally elevated SBP 140-150s--off antihypertensive agents.      Memory loss     Family very aware of changes.      Paroxysmal supraventricular tachycardia     Rate controlled, on Amiodarone 200mg  daily.     Unspecified constipation     Stable, takes Colace 100mg  bid.      Unspecified hypothyroidism - Primary     New, TSH 5.151 09/12/13, will start Levothyroxine 41mcg po daily, update TSH in 12 weeks.     Relevant Medications      levothyroxine (SYNTHROID, LEVOTHROID) 25 MCG tablet      Review of Systems:  Review of Systems  Constitutional: Negative for fever, chills, weight loss, malaise/fatigue and diaphoresis.  HENT: Positive for hearing loss. Negative for congestion, ear pain and sore throat.   Eyes: Negative for pain, discharge and redness.  Respiratory: Negative for cough, sputum production, shortness of breath and wheezing.   Cardiovascular: Positive for PND. Negative for chest pain, palpitations, orthopnea, claudication  and leg swelling.       Resolved bradycardia-HR 60s  Gastrointestinal: Positive for heartburn. Negative for nausea, vomiting, abdominal pain, diarrhea and constipation.       Occasionally.   Genitourinary: Positive for frequency. Negative for dysuria, urgency, hematuria and flank pain.  Musculoskeletal: Positive for back pain and joint pain. Negative for falls, myalgias and neck pain.  Skin: Negative for itching and rash.       Scaly rash on scalp  Neurological: Negative for dizziness, tingling, tremors, sensory change, speech change, focal weakness, seizures, loss of consciousness, weakness and headaches.  Endo/Heme/Allergies: Negative for environmental allergies and polydipsia. Does not bruise/bleed easily.  Psychiatric/Behavioral: Positive for memory loss. Negative for depression and hallucinations. The patient is not nervous/anxious and does not have insomnia.        Increased confusion     Past Medical History  Diagnosis Date  . CVA (cerebral infarction) 1981    MI  . Type II or unspecified type diabetes mellitus without mention of complication, not stated as uncontrolled   . Diverticulosis   . History of GI diverticular bleed 2007  . HTN (hypertension)   . AAA (abdominal aortic aneurysm)     CT 2006 3.2 cm  . Bladder cancer     Transitional cell carcinoma excised 2006  . Renal malignant tumor  transitional cell carcinoma lasered 2006  . Hyperlipidemia   . Hydrocele, left   . Inguinal hernia   . Coronary artery disease 05/05/2011    Echo EF 50%- 55% mild inferiof wall hypokinesis,mild dilatation left artium,mild mitral annular calcif. mild MR, mild TR, and mild calcif. /sclerosis trileaflet aortic valve w/ trace aortic insufficiency  . Dysrhythmia     af  . Anemia   . GERD (gastroesophageal reflux disease)   . Arthritis   . Myocardial infarction 1981    inferior-wall MI   . Atrial fibrillation 07/04/2010    cardioversion - successful  normal sinus rhythm; May 2012  discontinue Coumadin  and increaseAspirin  . Peripheral vascular disease 11/13/2010    Abd aortic doppler mild abd aortic aneurysm 3.1 x 2.9cm  . Coronary artery disease 10/01/2009    Persantaine myview-EF 53% moderate perfusion d/t infaract/scar w/mild perinfaract ischemia seen Mid Anterior ,Mid Inferior, Apical Inferior , Basal and Mid inferolateral, Apical Lateral region  . Hypopotassemia 06/22/2012  . Edema 06/22/2012  . Congestive heart failure, unspecified 06/13/2012  . Olecranon bursitis 02/03/2012  . Pneumonia, organism unspecified 01/20/2012  . Neoplasm of uncertain behavior of skin 10/19/2011  . Unspecified venous (peripheral) insufficiency 08/26/2011  . Unspecified constipation 07/29/2011  . Lumbago 07/09/2011  . Personal history of fall 06/25/2011  . Unspecified malignant neoplasm of scalp and skin of neck 06/11/2011  . Other acquired deformity of ankle and foot(736.79) 06/11/2011  . Other alteration of consciousness 06/11/2011  . Hyposmolality and/or hyponatremia 05/07/2011  . Major depressive disorder, single episode, unspecified 05/07/2011  . Abnormality of gait 04/27/2011  . Other B-complex deficiencies 04/09/2011  . Senile dementia, uncomplicated 31/51/7616  . Phlebitis and thrombophlebitis of other deep vessels of lower extremities 04/09/2011  . Hemorrhage of rectum and anus 04/09/2011  . Loss of weight 04/09/2011  . Anemia, unspecified 9/8/209  . Malignant neoplasm of bladder, part unspecified 04/08/2008  . Malignant neoplasm of kidney, except pelvis 04/08/2005   Past Surgical History  Procedure Laterality Date  . Coronary artery bypass graft  '94    LIMA-LAD, SVG-OM, PD, RCA  . Cataract extraction  2007  . Renal laser ablation of tumor  2006  . Coronary artery bypass graft  1994    LIMA to LAD, vein to the marginal, vein to the PDA, vein to the PLA   . Appendectomy    . Eye surgery    . Incision and drainage of wound  09/10/2011    Procedure: IRRIGATION AND  DEBRIDEMENT WOUND;  Surgeon: Theodoro Kos, DO;  Location: [place];  Service: Plastics;  Laterality: N/A;  irrigation and debridement scalp wound with removal of bone and placement of intregra or acell and vac   Social History:   reports that he has quit smoking. He does not have any smokeless tobacco history on file. He reports that he does not drink alcohol or use illicit drugs.  Family History  Problem Relation Age of Onset  . Diabetes Mother   . Diabetes Daughter     Medications: Patient's Medications  New Prescriptions   No medications on file  Previous Medications   AMIODARONE (PACERONE) 200 MG TABLET    Take 200 mg by mouth daily. Take one tablet daily for heart.   ASPIRIN EC 325 MG TABLET    Take 81 mg by mouth daily.    ATORVASTATIN (LIPITOR) 10 MG TABLET    Take 10 mg by mouth every Monday, Wednesday, and Friday.  INSULIN ASPART (NOVOLOG) 100 UNIT/ML INJECTION    Inject 5-8 Units into the skin 3 (three) times daily before meals. 5units with breakfast and dinner, 8 units with lunch for CBG>150   INSULIN DETEMIR (LEVEMIR FLEXPEN) 100 UNIT/ML INJECTION    Inject 14 Units into the skin at bedtime.    LEVOTHYROXINE (SYNTHROID, LEVOTHROID) 25 MCG TABLET    Take 25 mcg by mouth daily before breakfast.   NUTRITIONAL SUPPLEMENTS (BOOST DIABETIC) LIQD    Take by mouth. Drink 1 can daily.   OMEPRAZOLE (PRILOSEC) 20 MG CAPSULE    Take 20 mg by mouth daily.     VITAMIN B-12 (CYANOCOBALAMIN) 1000 MCG TABLET    Take 1,000 mcg by mouth daily.    Modified Medications   No medications on file  Discontinued Medications   No medications on file     Physical Exam: Physical Exam  Constitutional: He is oriented to person, place, and time. He appears well-developed and well-nourished.  HENT:  Head: Normocephalic and atraumatic.  Eyes: Conjunctivae and EOM are normal. Pupils are equal, round, and reactive to light.  Neck: Normal range of motion. Neck supple. No JVD present.  No thyromegaly present.  Cardiovascular: Normal rate.  An irregular rhythm present.  Murmur heard.  Systolic murmur is present with a grade of 2/6  Pulmonary/Chest: Effort normal. He has no wheezes. He has rales (bibailar dry rales). He exhibits no tenderness.  Abdominal: Soft. Bowel sounds are normal. There is no tenderness.  Musculoskeletal: Normal range of motion. He exhibits no edema and no tenderness.  Lymphadenopathy:    He has no cervical adenopathy.  Neurological: He is alert and oriented to person, place, and time. He has normal reflexes. No cranial nerve deficit. He exhibits normal muscle tone. Coordination normal.  Skin: Skin is warm and dry. No rash noted. No erythema.  Scalp scaly rash  Psychiatric: He has a normal mood and affect. His behavior is normal. Judgment and thought content normal.    Filed Vitals:   09/13/13 1534  BP: 150/70  Pulse: 82  Temp: 98.4 F (36.9 C)  TempSrc: Tympanic  Resp: 18   Labs reviewed: Basic Metabolic Panel:  Recent Labs  10/11/12  03/16/13 05/10/13 08/15/13  NA 139  < > 138 135* 138  K 4.2  < > 4.4 4.2 4.3  BUN 53*  < > 55* 55* 49*  CREATININE 2.0*  < > 2.3* 2.5* 2.5*  TSH 2.97  --   --   --   --   < > = values in this interval not displayed. Liver Function Tests:  Recent Labs  03/16/13 05/10/13 08/15/13  AST 17 18 15   ALT 12 15 13   ALKPHOS 84 104 87   CBC:  Recent Labs  03/16/13 05/10/13 08/15/13  WBC 9.4 6.0 8.9  HGB 11.2* 10.7* 10.0*  HCT 33* 32* 30*  PLT 233 194 283   Lipid Panel:  Recent Labs  03/14/13  CHOL 196  HDL 37  LDLCALC 128  TRIG 157    Past Procedures:  08/09/13 CXR minimal cardiomegaly appears new without pulmonary vascular congestion or pleural effusion, mild elevation left hemidiaphragm unchanged, no inflammatory consolidate or suspicious nodule, mild atelectasis right lung base and left perihilar region.   Assessment/Plan Unspecified hypothyroidism New, TSH 5.151 09/12/13, will start  Levothyroxine 61mcg po daily, update TSH in 12 weeks.   Anemia Stable with Hgb 10s off Fe, takes Vit B12 1034mcg daily.     Chronic renal disease,  stage III Bun/creat 55/2.48 05/10/13 and 59/2.49 08/15/13   DIABETES MELLITUS, TYPE II Hgb A1c 7.4 05/10/13, Bun/creat 55/2.48 05/10/13. Takes Levemir14 units and Novolog 5-8 units with meals.     GERD (gastroesophageal reflux disease) Occasional c/o indigestion, takes Omeprazole 20mg  bid. No worsening of s/s of GERD since off Reglan    HYPERTENSION Occasionally elevated SBP 140-150s--off antihypertensive agents.    Memory loss Family very aware of changes.    Paroxysmal supraventricular tachycardia Rate controlled, on Amiodarone 200mg  daily.   Unspecified constipation Stable, takes Colace 100mg  bid.      Family/ Staff Communication: observe the patient  Goals of Care: SNF  Labs/tests ordered: TSH

## 2013-09-13 NOTE — Assessment & Plan Note (Signed)
New, TSH 5.151 09/12/13, will start Levothyroxine 65mcg po daily, update TSH in 12 weeks.

## 2013-09-15 ENCOUNTER — Encounter: Payer: Self-pay | Admitting: Nurse Practitioner

## 2013-09-15 NOTE — Assessment & Plan Note (Signed)
Bun/creat 55/2.48 05/10/13 and 59/2.49 08/15/13   

## 2013-09-15 NOTE — Assessment & Plan Note (Signed)
Family very aware of changes.

## 2013-09-15 NOTE — Assessment & Plan Note (Signed)
Stable, takes Colace 100mg bid.   

## 2013-09-15 NOTE — Assessment & Plan Note (Signed)
Rate controlled, on Amiodarone 200mg daily.                  

## 2013-09-15 NOTE — Assessment & Plan Note (Signed)
Occasionally elevated SBP 140-150s--off antihypertensive agents.   

## 2013-09-15 NOTE — Assessment & Plan Note (Signed)
Occasional c/o indigestion, takes Omeprazole 20mg  bid. No worsening of s/s of GERD since off Reglan

## 2013-09-15 NOTE — Assessment & Plan Note (Signed)
Hgb A1c 7.4 05/10/13, Bun/creat 55/2.48 05/10/13. Takes Levemir14 units and Novolog 5-8 units with meals.  

## 2013-09-15 NOTE — Assessment & Plan Note (Signed)
Stable with Hgb 10s off Fe, takes Vit B12 1056mcg daily.

## 2013-10-09 ENCOUNTER — Encounter: Payer: Self-pay | Admitting: Nurse Practitioner

## 2013-10-09 ENCOUNTER — Non-Acute Institutional Stay (SKILLED_NURSING_FACILITY): Payer: Medicare Other | Admitting: Nurse Practitioner

## 2013-10-09 DIAGNOSIS — N183 Chronic kidney disease, stage 3 unspecified: Secondary | ICD-10-CM

## 2013-10-09 DIAGNOSIS — L8990 Pressure ulcer of unspecified site, unspecified stage: Secondary | ICD-10-CM

## 2013-10-09 DIAGNOSIS — N189 Chronic kidney disease, unspecified: Secondary | ICD-10-CM

## 2013-10-09 DIAGNOSIS — I471 Supraventricular tachycardia, unspecified: Secondary | ICD-10-CM

## 2013-10-09 DIAGNOSIS — K5900 Constipation, unspecified: Secondary | ICD-10-CM

## 2013-10-09 DIAGNOSIS — E1122 Type 2 diabetes mellitus with diabetic chronic kidney disease: Secondary | ICD-10-CM

## 2013-10-09 DIAGNOSIS — R413 Other amnesia: Secondary | ICD-10-CM

## 2013-10-09 DIAGNOSIS — I10 Essential (primary) hypertension: Secondary | ICD-10-CM

## 2013-10-09 DIAGNOSIS — D649 Anemia, unspecified: Secondary | ICD-10-CM

## 2013-10-09 DIAGNOSIS — L8992 Pressure ulcer of unspecified site, stage 2: Secondary | ICD-10-CM

## 2013-10-09 DIAGNOSIS — E039 Hypothyroidism, unspecified: Secondary | ICD-10-CM

## 2013-10-09 DIAGNOSIS — E1129 Type 2 diabetes mellitus with other diabetic kidney complication: Secondary | ICD-10-CM

## 2013-10-09 NOTE — Assessment & Plan Note (Signed)
Left buttock pressure ulcer-not infected, pressure reduction, dietary protein supplement, and wound care at [facility]

## 2013-10-09 NOTE — Assessment & Plan Note (Signed)
Update TSH and observe the patient.

## 2013-10-09 NOTE — Assessment & Plan Note (Signed)
Occasionally elevated SBP 140-150s--off antihypertensive agents.   

## 2013-10-09 NOTE — Progress Notes (Signed)
Patient ID: [name] [name], male   DOB: March 14, 1921, 80 y.o.   MRN: [id number]   Code Status: DNR  Allergies  Allergen Reactions  . Celebrex [Celecoxib]     Chief Complaint  Patient presents with  . Medical Management of Chronic Issues  . Acute Visit    superficial stage II pressure ulcer L buttock.     HPI: Patient is a 80 y.o. male seen in the SNF at [facility] today for evaluation of L buttock pressure ulcer and chronic medical conditions.  Problem List Items Addressed This Visit   Anemia     Stable with Hgb 10s off Fe, takes Vit B12 1037mcg daily.      Chronic renal disease, stage III (Chronic)     Bun/creat 55/2.48 05/10/13 and 59/2.49 08/15/13      HYPERTENSION     Occasionally elevated SBP 140-150s--off antihypertensive agents.       Memory loss     Family very aware of changes. More confused than usual.    Paroxysmal supraventricular tachycardia     Rate controlled, on Amiodarone 200mg  daily.      Pressure ulcer stage II     Left buttock pressure ulcer-not infected, pressure reduction, dietary protein supplement, and wound care at [facility]    Type 2 diabetes mellitus with diabetic chronic kidney disease     Hgb A1c 7.4 05/10/13, Bun/creat 55/2.48 05/10/13. Takes Levemir14 units and Novolog 5-8 units with meals    Unspecified constipation     Stable, takes Colace 100mg  bid    Unspecified hypothyroidism - Primary     Update TSH and observe the patient.        Review of Systems:  Review of Systems  Constitutional: Negative for fever, chills, weight loss, malaise/fatigue and diaphoresis.  HENT: Positive for hearing loss. Negative for congestion, ear pain and sore throat.   Eyes: Negative for pain, discharge and redness.  Respiratory: Negative for cough, sputum production, shortness of breath and wheezing.   Cardiovascular: Positive for PND. Negative for chest pain, palpitations, orthopnea, claudication and leg swelling.       Resolved bradycardia-HR  60s  Gastrointestinal: Positive for heartburn. Negative for nausea, vomiting, abdominal pain, diarrhea and constipation.       Occasionally.   Genitourinary: Positive for frequency. Negative for dysuria, urgency, hematuria and flank pain.  Musculoskeletal: Positive for back pain and joint pain. Negative for falls, myalgias and neck pain.  Skin: Negative for itching and rash.       Scaly rash on scalp. Small superficial left buttock pressure ulcer.   Neurological: Negative for dizziness, tingling, tremors, sensory change, speech change, focal weakness, seizures, loss of consciousness, weakness and headaches.  Endo/Heme/Allergies: Negative for environmental allergies and polydipsia. Does not bruise/bleed easily.  Psychiatric/Behavioral: Positive for memory loss. Negative for depression and hallucinations. The patient is not nervous/anxious and does not have insomnia.        Increased confusion     Past Medical History  Diagnosis Date  . CVA (cerebral infarction) 1981    MI  . Type II or unspecified type diabetes mellitus without mention of complication, not stated as uncontrolled   . Diverticulosis   . History of GI diverticular bleed 2007  . HTN (hypertension)   . AAA (abdominal aortic aneurysm)     CT 2006 3.2 cm  . Bladder cancer     Transitional cell carcinoma excised 2006  . Renal malignant tumor     transitional cell carcinoma lasered  2006  . Hyperlipidemia   . Hydrocele, left   . Inguinal hernia   . Coronary artery disease 05/05/2011    Echo EF 50%- 55% mild inferiof wall hypokinesis,mild dilatation left artium,mild mitral annular calcif. mild MR, mild TR, and mild calcif. /sclerosis trileaflet aortic valve w/ trace aortic insufficiency  . Dysrhythmia     af  . Anemia   . GERD (gastroesophageal reflux disease)   . Arthritis   . Myocardial infarction 1981    inferior-wall MI   . Atrial fibrillation 07/04/2010    cardioversion - successful  normal sinus rhythm; May 2012  discontinue Coumadin  and increaseAspirin  . Peripheral vascular disease 11/13/2010    Abd aortic doppler mild abd aortic aneurysm 3.1 x 2.9cm  . Coronary artery disease 10/01/2009    Persantaine myview-EF 53% moderate perfusion d/t infaract/scar w/mild perinfaract ischemia seen Mid Anterior ,Mid Inferior, Apical Inferior , Basal and Mid inferolateral, Apical Lateral region  . Hypopotassemia 06/22/2012  . Edema 06/22/2012  . Congestive heart failure, unspecified 06/13/2012  . Olecranon bursitis 02/03/2012  . Pneumonia, organism unspecified 01/20/2012  . Neoplasm of uncertain behavior of skin 10/19/2011  . Unspecified venous (peripheral) insufficiency 08/26/2011  . Unspecified constipation 07/29/2011  . Lumbago 07/09/2011  . Personal history of fall 06/25/2011  . Unspecified malignant neoplasm of scalp and skin of neck 06/11/2011  . Other acquired deformity of ankle and foot(736.79) 06/11/2011  . Other alteration of consciousness 06/11/2011  . Hyposmolality and/or hyponatremia 05/07/2011  . Major depressive disorder, single episode, unspecified 05/07/2011  . Abnormality of gait 04/27/2011  . Other B-complex deficiencies 04/09/2011  . Senile dementia, uncomplicated 19/37/9024  . Phlebitis and thrombophlebitis of other deep vessels of lower extremities 04/09/2011  . Hemorrhage of rectum and anus 04/09/2011  . Loss of weight 04/09/2011  . Anemia, unspecified 9/8/209  . Malignant neoplasm of bladder, part unspecified 04/08/2008  . Malignant neoplasm of kidney, except pelvis 04/08/2005   Past Surgical History  Procedure Laterality Date  . Coronary artery bypass graft  '94    LIMA-LAD, SVG-OM, PD, RCA  . Cataract extraction  2007  . Renal laser ablation of tumor  2006  . Coronary artery bypass graft  1994    LIMA to LAD, vein to the marginal, vein to the PDA, vein to the PLA   . Appendectomy    . Eye surgery    . Incision and drainage of wound  09/10/2011    Procedure: IRRIGATION AND  DEBRIDEMENT WOUND;  Surgeon: Theodoro Kos, DO;  Location: [place];  Service: Plastics;  Laterality: N/A;  irrigation and debridement scalp wound with removal of bone and placement of intregra or acell and vac   Social History:   reports that he has quit smoking. He does not have any smokeless tobacco history on file. He reports that he does not drink alcohol or use illicit drugs.  Family History  Problem Relation Age of Onset  . Diabetes Mother   . Diabetes Daughter     Medications: Patient's Medications  New Prescriptions   No medications on file  Previous Medications   AMIODARONE (PACERONE) 200 MG TABLET    Take 200 mg by mouth daily. Take one tablet daily for heart.   ASPIRIN EC 325 MG TABLET    Take 81 mg by mouth daily.    ATORVASTATIN (LIPITOR) 10 MG TABLET    Take 10 mg by mouth every Monday, Wednesday, and Friday.    INSULIN ASPART (NOVOLOG) 100  UNIT/ML INJECTION    Inject 5-8 Units into the skin 3 (three) times daily before meals. 5units with breakfast and dinner, 8 units with lunch for CBG>150   INSULIN DETEMIR (LEVEMIR FLEXPEN) 100 UNIT/ML INJECTION    Inject 14 Units into the skin at bedtime.    LEVOTHYROXINE (SYNTHROID, LEVOTHROID) 25 MCG TABLET    Take 25 mcg by mouth daily before breakfast.   NUTRITIONAL SUPPLEMENTS (BOOST DIABETIC) LIQD    Take by mouth. Drink 1 can daily.   OMEPRAZOLE (PRILOSEC) 20 MG CAPSULE    Take 20 mg by mouth daily.     VITAMIN B-12 (CYANOCOBALAMIN) 1000 MCG TABLET    Take 1,000 mcg by mouth daily.    Modified Medications   No medications on file  Discontinued Medications   No medications on file     Physical Exam: Physical Exam  Constitutional: He is oriented to person, place, and time. He appears well-developed and well-nourished.  HENT:  Head: Normocephalic and atraumatic.  Eyes: Conjunctivae and EOM are normal. Pupils are equal, round, and reactive to light.  Neck: Normal range of motion. Neck supple. No JVD present.  No thyromegaly present.  Cardiovascular: Normal rate.  An irregular rhythm present.  Murmur heard.  Systolic murmur is present with a grade of 2/6  Pulmonary/Chest: Effort normal. He has no wheezes. He has rales (bibailar dry rales). He exhibits no tenderness.  Abdominal: Soft. Bowel sounds are normal. There is no tenderness.  Musculoskeletal: Normal range of motion. He exhibits no edema and no tenderness.  Lymphadenopathy:    He has no cervical adenopathy.  Neurological: He is alert and oriented to person, place, and time. He has normal reflexes. No cranial nerve deficit. He exhibits normal muscle tone. Coordination normal.  Skin: Skin is warm and dry. No rash noted. No erythema.  Scalp scaly rash. Small superficial left buttock pressure ulcer.    Psychiatric: He has a normal mood and affect. His behavior is normal. Judgment and thought content normal.    Filed Vitals:   10/09/13 1156  BP: 148/70  Pulse: 75  Temp: 98.2 F (36.8 C)  TempSrc: Tympanic  Resp: 16   Labs reviewed: Basic Metabolic Panel:  Recent Labs  10/11/12  03/16/13 05/10/13 08/15/13 09/12/13  NA 139  < > 138 135* 138  --   K 4.2  < > 4.4 4.2 4.3  --   BUN 53*  < > 55* 55* 49*  --   CREATININE 2.0*  < > 2.3* 2.5* 2.5*  --   TSH 2.97  --   --   --   --  5.15  < > = values in this interval not displayed. Liver Function Tests:  Recent Labs  03/16/13 05/10/13 08/15/13  AST 17 18 15   ALT 12 15 13   ALKPHOS 84 104 87   CBC:  Recent Labs  03/16/13 05/10/13 08/15/13  WBC 9.4 6.0 8.9  HGB 11.2* 10.7* 10.0*  HCT 33* 32* 30*  PLT 233 194 283   Lipid Panel:  Recent Labs  03/14/13  CHOL 196  HDL 37  LDLCALC 128  TRIG 157    Past Procedures:  08/09/13 CXR minimal cardiomegaly appears new without pulmonary vascular congestion or pleural effusion, mild elevation left hemidiaphragm unchanged, no inflammatory consolidate or suspicious nodule, mild atelectasis right lung base and left perihilar  region.   Assessment/Plan Unspecified hypothyroidism Update TSH and observe the patient.   Pressure ulcer stage II Left buttock pressure ulcer-not infected, pressure  reduction, dietary protein supplement, and wound care at [facility]  Type 2 diabetes mellitus with diabetic chronic kidney disease Hgb A1c 7.4 05/10/13, Bun/creat 55/2.48 05/10/13. Takes Levemir14 units and Novolog 5-8 units with meals  Chronic renal disease, stage III Bun/creat 55/2.48 05/10/13 and 59/2.49 08/15/13    HYPERTENSION Occasionally elevated SBP 140-150s--off antihypertensive agents.     Unspecified constipation Stable, takes Colace 100mg  bid  Paroxysmal supraventricular tachycardia Rate controlled, on Amiodarone 200mg  daily.    Memory loss Family very aware of changes. More confused than usual.  Anemia Stable with Hgb 10s off Fe, takes Vit B12 1053mcg daily.      Family/ Staff Communication: observe the patient  Goals of Care: SNF  Labs/tests ordered: none

## 2013-10-09 NOTE — Assessment & Plan Note (Signed)
Bun/creat 55/2.48 05/10/13 and 59/2.49 08/15/13

## 2013-10-09 NOTE — Assessment & Plan Note (Signed)
Rate controlled, on Amiodarone 200mg  daily.

## 2013-10-09 NOTE — Assessment & Plan Note (Signed)
Stable with Hgb 10s off Fe, takes Vit B12 1000mcg daily.    

## 2013-10-09 NOTE — Assessment & Plan Note (Signed)
Stable, takes Colace 100mg bid.   

## 2013-10-09 NOTE — Assessment & Plan Note (Signed)
Hgb A1c 7.4 05/10/13, Bun/creat 55/2.48 05/10/13. Takes Levemir14 units and Novolog 5-8 units with meals.  

## 2013-10-09 NOTE — Assessment & Plan Note (Signed)
Family very aware of changes. More confused than usual.

## 2013-11-01 ENCOUNTER — Non-Acute Institutional Stay (SKILLED_NURSING_FACILITY): Payer: Medicare Other | Admitting: Nurse Practitioner

## 2013-11-01 ENCOUNTER — Encounter: Payer: Self-pay | Admitting: Nurse Practitioner

## 2013-11-01 DIAGNOSIS — E039 Hypothyroidism, unspecified: Secondary | ICD-10-CM

## 2013-11-01 DIAGNOSIS — N189 Chronic kidney disease, unspecified: Secondary | ICD-10-CM

## 2013-11-01 DIAGNOSIS — D649 Anemia, unspecified: Secondary | ICD-10-CM

## 2013-11-01 DIAGNOSIS — K219 Gastro-esophageal reflux disease without esophagitis: Secondary | ICD-10-CM

## 2013-11-01 DIAGNOSIS — Y92129 Unspecified place in nursing home as the place of occurrence of the external cause: Secondary | ICD-10-CM

## 2013-11-01 DIAGNOSIS — W19XXXA Unspecified fall, initial encounter: Secondary | ICD-10-CM

## 2013-11-01 DIAGNOSIS — K5900 Constipation, unspecified: Secondary | ICD-10-CM

## 2013-11-01 DIAGNOSIS — I471 Supraventricular tachycardia: Secondary | ICD-10-CM

## 2013-11-01 DIAGNOSIS — E1129 Type 2 diabetes mellitus with other diabetic kidney complication: Secondary | ICD-10-CM

## 2013-11-01 DIAGNOSIS — E1122 Type 2 diabetes mellitus with diabetic chronic kidney disease: Secondary | ICD-10-CM

## 2013-11-01 NOTE — Progress Notes (Signed)
Patient ID: [name] [name], male   DOB: 04/25/1921, 80 y.o.   MRN: [id number]   Code Status: DNR  Allergies  Allergen Reactions  . Celebrex [Celecoxib]     Chief Complaint  Patient presents with  . Medical Management of Chronic Issues  . Acute Visit    fell 10/31/13    HPI: Patient is a 80 y.o. male seen in the SNF at [facility] today for evaluation of s/p fall, left elbow abrasion, L buttock pressure ulcer and chronic medical conditions.  Problem List Items Addressed This Visit   Unspecified hypothyroidism     Update TSH and observe the patient. Continue Levothyroxine 62mcg.     Unspecified constipation     Stable, takes Colace 100mg  bid     Type II diabetes mellitus with renal manifestations     Hgb A1c 7.4 05/10/13, Bun/creat 55/2.48 05/10/13. Takes Levemir14 units and Novolog 5-8 units with meals. Update Hgb A1c     Type 2 diabetes mellitus with diabetic chronic kidney disease     Bun/creat 55/2.48 05/10/13 and 59/2.49 08/15/13. Update BMP       Paroxysmal supraventricular tachycardia     Rate controlled.     GERD (gastroesophageal reflux disease)     Stable. Continue PPI    Fall at nursing home - Primary     No decreased ROM or altered mental status or focal neurological deficit noted. Continue to observe.     Anemia     Stable with Hgb 10s off Fe, takes Vit B12 1096mcg daily. Update CBC         Review of Systems:  Review of Systems  Constitutional: Negative for fever, chills, weight loss, malaise/fatigue and diaphoresis.  HENT: Positive for hearing loss. Negative for congestion, ear pain and sore throat.   Eyes: Negative for pain, discharge and redness.  Respiratory: Negative for cough, sputum production, shortness of breath and wheezing.   Cardiovascular: Positive for PND. Negative for chest pain, palpitations, orthopnea, claudication and leg swelling.       Resolved bradycardia-HR 60s  Gastrointestinal: Positive for heartburn. Negative for  nausea, vomiting, abdominal pain, diarrhea and constipation.       Occasionally.   Genitourinary: Positive for frequency. Negative for dysuria, urgency, hematuria and flank pain.  Musculoskeletal: Positive for back pain and joint pain. Negative for falls, myalgias and neck pain.  Skin: Negative for itching and rash.       Small superficial left buttock pressure ulcer stage II healing. Left elbow abrasion-not infected.   Neurological: Negative for dizziness, tingling, tremors, sensory change, speech change, focal weakness, seizures, loss of consciousness, weakness and headaches.  Endo/Heme/Allergies: Negative for environmental allergies and polydipsia. Does not bruise/bleed easily.  Psychiatric/Behavioral: Positive for memory loss. Negative for depression and hallucinations. The patient is not nervous/anxious and does not have insomnia.        Increased confusion     Past Medical History  Diagnosis Date  . CVA (cerebral infarction) 1981    MI  . Type II or unspecified type diabetes mellitus without mention of complication, not stated as uncontrolled   . Diverticulosis   . History of GI diverticular bleed 2007  . HTN (hypertension)   . AAA (abdominal aortic aneurysm)     CT 2006 3.2 cm  . Bladder cancer     Transitional cell carcinoma excised 2006  . Renal malignant tumor     transitional cell carcinoma lasered 2006  . Hyperlipidemia   . Hydrocele, left   .  Inguinal hernia   . Coronary artery disease 05/05/2011    Echo EF 50%- 55% mild inferiof wall hypokinesis,mild dilatation left artium,mild mitral annular calcif. mild MR, mild TR, and mild calcif. /sclerosis trileaflet aortic valve w/ trace aortic insufficiency  . Dysrhythmia     af  . Anemia   . GERD (gastroesophageal reflux disease)   . Arthritis   . Myocardial infarction 1981    inferior-wall MI   . Atrial fibrillation 07/04/2010    cardioversion - successful  normal sinus rhythm; May 2012 discontinue Coumadin  and  increaseAspirin  . Peripheral vascular disease 11/13/2010    Abd aortic doppler mild abd aortic aneurysm 3.1 x 2.9cm  . Coronary artery disease 10/01/2009    Persantaine myview-EF 53% moderate perfusion d/t infaract/scar w/mild perinfaract ischemia seen Mid Anterior ,Mid Inferior, Apical Inferior , Basal and Mid inferolateral, Apical Lateral region  . Hypopotassemia 06/22/2012  . Edema 06/22/2012  . Congestive heart failure, unspecified 06/13/2012  . Olecranon bursitis 02/03/2012  . Pneumonia, organism unspecified 01/20/2012  . Neoplasm of uncertain behavior of skin 10/19/2011  . Unspecified venous (peripheral) insufficiency 08/26/2011  . Unspecified constipation 07/29/2011  . Lumbago 07/09/2011  . Personal history of fall 06/25/2011  . Unspecified malignant neoplasm of scalp and skin of neck 06/11/2011  . Other acquired deformity of ankle and foot(736.79) 06/11/2011  . Other alteration of consciousness 06/11/2011  . Hyposmolality and/or hyponatremia 05/07/2011  . Major depressive disorder, single episode, unspecified 05/07/2011  . Abnormality of gait 04/27/2011  . Other B-complex deficiencies 04/09/2011  . Senile dementia, uncomplicated 89/38/1017  . Phlebitis and thrombophlebitis of other deep vessels of lower extremities 04/09/2011  . Hemorrhage of rectum and anus 04/09/2011  . Loss of weight 04/09/2011  . Anemia, unspecified 9/8/209  . Malignant neoplasm of bladder, part unspecified 04/08/2008  . Malignant neoplasm of kidney, except pelvis 04/08/2005   Past Surgical History  Procedure Laterality Date  . Coronary artery bypass graft  '94    LIMA-LAD, SVG-OM, PD, RCA  . Cataract extraction  2007  . Renal laser ablation of tumor  2006  . Coronary artery bypass graft  1994    LIMA to LAD, vein to the marginal, vein to the PDA, vein to the PLA   . Appendectomy    . Eye surgery    . Incision and drainage of wound  09/10/2011    Procedure: IRRIGATION AND DEBRIDEMENT WOUND;  Surgeon:  Theodoro Kos, DO;  Location: [place];  Service: Plastics;  Laterality: N/A;  irrigation and debridement scalp wound with removal of bone and placement of intregra or acell and vac   Social History:   reports that he has quit smoking. He does not have any smokeless tobacco history on file. He reports that he does not drink alcohol or use illicit drugs.  Family History  Problem Relation Age of Onset  . Diabetes Mother   . Diabetes Daughter     Medications: Patient's Medications  New Prescriptions   No medications on file  Previous Medications   AMIODARONE (PACERONE) 200 MG TABLET    Take 200 mg by mouth daily. Take one tablet daily for heart.   ASPIRIN EC 325 MG TABLET    Take 81 mg by mouth daily.    ATORVASTATIN (LIPITOR) 10 MG TABLET    Take 10 mg by mouth every Monday, Wednesday, and Friday.    INSULIN ASPART (NOVOLOG) 100 UNIT/ML INJECTION    Inject 5-8 Units into the skin 3 (  three) times daily before meals. 5units with breakfast and dinner, 8 units with lunch for CBG>150   INSULIN DETEMIR (LEVEMIR FLEXPEN) 100 UNIT/ML INJECTION    Inject 14 Units into the skin at bedtime.    LEVOTHYROXINE (SYNTHROID, LEVOTHROID) 25 MCG TABLET    Take 25 mcg by mouth daily before breakfast.   NUTRITIONAL SUPPLEMENTS (BOOST DIABETIC) LIQD    Take by mouth. Drink 1 can daily.   OMEPRAZOLE (PRILOSEC) 20 MG CAPSULE    Take 20 mg by mouth daily.     VITAMIN B-12 (CYANOCOBALAMIN) 1000 MCG TABLET    Take 1,000 mcg by mouth daily.    Modified Medications   No medications on file  Discontinued Medications   No medications on file     Physical Exam: Physical Exam  Constitutional: He is oriented to person, place, and time. He appears well-developed and well-nourished.  HENT:  Head: Normocephalic and atraumatic.  Eyes: Conjunctivae and EOM are normal. Pupils are equal, round, and reactive to light.  Neck: Normal range of motion. Neck supple. No JVD present. No thyromegaly present.    Cardiovascular: Normal rate.  An irregular rhythm present.  Murmur heard.  Systolic murmur is present with a grade of 2/6  Pulmonary/Chest: Effort normal. He has no wheezes. He has rales (bibailar dry rales). He exhibits no tenderness.  Abdominal: Soft. Bowel sounds are normal. There is no tenderness.  Musculoskeletal: Normal range of motion. He exhibits no edema and no tenderness.  Lymphadenopathy:    He has no cervical adenopathy.  Neurological: He is alert and oriented to person, place, and time. He has normal reflexes. No cranial nerve deficit. He exhibits normal muscle tone. Coordination normal.  Skin: Skin is warm and dry. No rash noted. No erythema.  Small superficial left buttock pressure ulcer stage II healing. Left elbow abrasion-not infected.    Psychiatric: He has a normal mood and affect. His behavior is normal. Judgment and thought content normal.    Filed Vitals:   11/01/13 1659  BP: 122/70  Pulse: 67  Temp: 97.7 F (36.5 C)  TempSrc: Tympanic  Resp: 20   Labs reviewed: Basic Metabolic Panel:  Recent Labs  03/16/13 05/10/13 08/15/13 09/12/13  NA 138 135* 138  --   K 4.4 4.2 4.3  --   BUN 55* 55* 49*  --   CREATININE 2.3* 2.5* 2.5*  --   TSH  --   --   --  5.15   Liver Function Tests:  Recent Labs  03/16/13 05/10/13 08/15/13  AST 17 18 15   ALT 12 15 13   ALKPHOS 84 104 87   CBC:  Recent Labs  03/16/13 05/10/13 08/15/13  WBC 9.4 6.0 8.9  HGB 11.2* 10.7* 10.0*  HCT 33* 32* 30*  PLT 233 194 283   Lipid Panel:  Recent Labs  03/14/13  CHOL 196  HDL 37  LDLCALC 128  TRIG 157    Past Procedures:  08/09/13 CXR minimal cardiomegaly appears new without pulmonary vascular congestion or pleural effusion, mild elevation left hemidiaphragm unchanged, no inflammatory consolidate or suspicious nodule, mild atelectasis right lung base and left perihilar region.   Assessment/Plan Fall at nursing home No decreased ROM or altered mental status or focal  neurological deficit noted. Continue to observe.   Type II diabetes mellitus with renal manifestations Hgb A1c 7.4 05/10/13, Bun/creat 55/2.48 05/10/13. Takes Levemir14 units and Novolog 5-8 units with meals. Update Hgb A1c   Unspecified hypothyroidism Update TSH and observe the patient.  Continue Levothyroxine 13mcg.   Paroxysmal supraventricular tachycardia Rate controlled.   GERD (gastroesophageal reflux disease) Stable. Continue PPI  Unspecified constipation Stable, takes Colace 100mg  bid   Type 2 diabetes mellitus with diabetic chronic kidney disease Bun/creat 55/2.48 05/10/13 and 59/2.49 08/15/13. Update BMP     Anemia Stable with Hgb 10s off Fe, takes Vit B12 10107mcg daily. Update CBC      Family/ Staff Communication: observe the patient  Goals of Care: SNF  Labs/tests ordered: CBC, BMP, A1c, TSH

## 2013-11-06 ENCOUNTER — Encounter: Payer: Self-pay | Admitting: Nurse Practitioner

## 2013-11-06 DIAGNOSIS — W19XXXA Unspecified fall, initial encounter: Secondary | ICD-10-CM | POA: Insufficient documentation

## 2013-11-06 DIAGNOSIS — Y92129 Unspecified place in nursing home as the place of occurrence of the external cause: Secondary | ICD-10-CM

## 2013-11-06 NOTE — Assessment & Plan Note (Signed)
Stable, takes Colace 100mg bid.   

## 2013-11-06 NOTE — Assessment & Plan Note (Signed)
Rate controlled 

## 2013-11-06 NOTE — Assessment & Plan Note (Signed)
Stable.  Continue PPI. 

## 2013-11-06 NOTE — Assessment & Plan Note (Addendum)
Update TSH and observe the patient. Continue Levothyroxine 41mcg.

## 2013-11-06 NOTE — Assessment & Plan Note (Signed)
Bun/creat 55/2.48 05/10/13 and 59/2.49 08/15/13. Update BMP

## 2013-11-06 NOTE — Assessment & Plan Note (Signed)
Stable with Hgb 10s off Fe, takes Vit B12 1068mcg daily. Update CBC

## 2013-11-06 NOTE — Assessment & Plan Note (Signed)
No decreased ROM or altered mental status or focal neurological deficit noted. Continue to observe.

## 2013-11-06 NOTE — Assessment & Plan Note (Signed)
Hgb A1c 7.4 05/10/13, Bun/creat 55/2.48 05/10/13. Takes Levemir14 units and Novolog 5-8 units with meals. Update Hgb A1c

## 2013-12-04 ENCOUNTER — Encounter: Payer: Self-pay | Admitting: Nurse Practitioner

## 2013-12-04 ENCOUNTER — Non-Acute Institutional Stay (SKILLED_NURSING_FACILITY): Payer: Medicare Other | Admitting: Nurse Practitioner

## 2013-12-04 DIAGNOSIS — E039 Hypothyroidism, unspecified: Secondary | ICD-10-CM

## 2013-12-04 DIAGNOSIS — N183 Chronic kidney disease, stage 3 unspecified: Secondary | ICD-10-CM

## 2013-12-04 DIAGNOSIS — E119 Type 2 diabetes mellitus without complications: Secondary | ICD-10-CM

## 2013-12-04 DIAGNOSIS — I471 Supraventricular tachycardia, unspecified: Secondary | ICD-10-CM

## 2013-12-04 DIAGNOSIS — K5900 Constipation, unspecified: Secondary | ICD-10-CM

## 2013-12-04 DIAGNOSIS — K219 Gastro-esophageal reflux disease without esophagitis: Secondary | ICD-10-CM

## 2013-12-04 NOTE — Assessment & Plan Note (Signed)
Stable.  Continue PPI. 

## 2013-12-04 NOTE — Assessment & Plan Note (Signed)
Stable, takes Colace 100mg bid.   

## 2013-12-04 NOTE — Progress Notes (Signed)
Patient ID: [name] [name], male   DOB: [birth year], 80 y.o.   MRN: [id number]   Code Status: DNR  Allergies  Allergen Reactions  . Celebrex [Celecoxib]     Chief Complaint  Patient presents with  . Medical Management of Chronic Issues    HPI: Patient is a 80 y.o. male seen in the SNF at [facility] today for evaluation of chronic medical conditions.  Problem List Items Addressed This Visit   DIABETES MELLITUS, TYPE II - Primary (Chronic)     Bun/creat 55/2.48 05/10/13 and 59/2.49 08/15/13. Update BMP and Hgb A1c pending.     Chronic renal disease, stage III (Chronic)     Bun/creat 55/2.48 05/10/13 and 59/2.49 08/15/13. Pending BMP      GERD (gastroesophageal reflux disease)     Stable. Continue PPI     Paroxysmal supraventricular tachycardia     Rate controlled.      Unspecified constipation     Stable, takes Colace 100mg  bid      Unspecified hypothyroidism     Update TSH pending and observe the patient. Continue Levothyroxine 65mcg.         Review of Systems:  Review of Systems  Constitutional: Negative for fever, chills, weight loss, malaise/fatigue and diaphoresis.  HENT: Positive for hearing loss. Negative for congestion, ear pain and sore throat.   Eyes: Negative for pain, discharge and redness.  Respiratory: Negative for cough, sputum production, shortness of breath and wheezing.   Cardiovascular: Positive for PND. Negative for chest pain, palpitations, orthopnea, claudication and leg swelling.       Resolved bradycardia-HR 60s  Gastrointestinal: Positive for heartburn. Negative for nausea, vomiting, abdominal pain, diarrhea and constipation.       Occasionally.   Genitourinary: Positive for frequency. Negative for dysuria, urgency, hematuria and flank pain.  Musculoskeletal: Positive for back pain and joint pain. Negative for falls, myalgias and neck pain.  Skin: Negative for itching and rash.       Small superficial left buttock pressure ulcer  stage II healing.   Neurological: Negative for dizziness, tingling, tremors, sensory change, speech change, focal weakness, seizures, loss of consciousness, weakness and headaches.  Endo/Heme/Allergies: Negative for environmental allergies and polydipsia. Does not bruise/bleed easily.  Psychiatric/Behavioral: Positive for memory loss. Negative for depression and hallucinations. The patient is not nervous/anxious and does not have insomnia.        Increased confusion     Past Medical History  Diagnosis Date  . CVA (cerebral infarction) 1981    MI  . Type II or unspecified type diabetes mellitus without mention of complication, not stated as uncontrolled   . Diverticulosis   . History of GI diverticular bleed 2007  . HTN (hypertension)   . AAA (abdominal aortic aneurysm)     CT 2006 3.2 cm  . Bladder cancer     Transitional cell carcinoma excised 2006  . Renal malignant tumor     transitional cell carcinoma lasered 2006  . Hyperlipidemia   . Hydrocele, left   . Inguinal hernia   . Coronary artery disease 05/05/2011    Echo EF 50%- 55% mild inferiof wall hypokinesis,mild dilatation left artium,mild mitral annular calcif. mild MR, mild TR, and mild calcif. /sclerosis trileaflet aortic valve w/ trace aortic insufficiency  . Dysrhythmia     af  . Anemia   . GERD (gastroesophageal reflux disease)   . Arthritis   . Myocardial infarction 1981    inferior-wall MI   . Atrial  fibrillation 07/04/2010    cardioversion - successful  normal sinus rhythm; May 2012 discontinue Coumadin  and increaseAspirin  . Peripheral vascular disease 11/13/2010    Abd aortic doppler mild abd aortic aneurysm 3.1 x 2.9cm  . Coronary artery disease 10/01/2009    Persantaine myview-EF 53% moderate perfusion d/t infaract/scar w/mild perinfaract ischemia seen Mid Anterior ,Mid Inferior, Apical Inferior , Basal and Mid inferolateral, Apical Lateral region  . Hypopotassemia 06/22/2012  . Edema 06/22/2012  .  Congestive heart failure, unspecified 06/13/2012  . Olecranon bursitis 02/03/2012  . Pneumonia, organism unspecified 01/20/2012  . Neoplasm of uncertain behavior of skin 10/19/2011  . Unspecified venous (peripheral) insufficiency 08/26/2011  . Unspecified constipation 07/29/2011  . Lumbago 07/09/2011  . Personal history of fall 06/25/2011  . Unspecified malignant neoplasm of scalp and skin of neck 06/11/2011  . Other acquired deformity of ankle and foot(736.79) 06/11/2011  . Other alteration of consciousness 06/11/2011  . Hyposmolality and/or hyponatremia 05/07/2011  . Major depressive disorder, single episode, unspecified 05/07/2011  . Abnormality of gait 04/27/2011  . Other B-complex deficiencies 04/09/2011  . Senile dementia, uncomplicated 54/62/7035  . Phlebitis and thrombophlebitis of other deep vessels of lower extremities 04/09/2011  . Hemorrhage of rectum and anus 04/09/2011  . Loss of weight 04/09/2011  . Anemia, unspecified 9/8/209  . Malignant neoplasm of bladder, part unspecified 04/08/2008  . Malignant neoplasm of kidney, except pelvis 04/08/2005   Past Surgical History  Procedure Laterality Date  . Coronary artery bypass graft  '94    LIMA-LAD, SVG-OM, PD, RCA  . Cataract extraction  2007  . Renal laser ablation of tumor  2006  . Coronary artery bypass graft  1994    LIMA to LAD, vein to the marginal, vein to the PDA, vein to the PLA   . Appendectomy    . Eye surgery    . Incision and drainage of wound  09/10/2011    Procedure: IRRIGATION AND DEBRIDEMENT WOUND;  Surgeon: Theodoro Kos, DO;  Location: [facility];  Service: Plastics;  Laterality: N/A;  irrigation and debridement scalp wound with removal of bone and placement of intregra or acell and vac   Social History:   reports that he has quit smoking. He does not have any smokeless tobacco history on file. He reports that he does not drink alcohol or use illicit drugs.  Family History  Problem  Relation Age of Onset  . Diabetes Mother   . Diabetes Daughter     Medications: Patient's Medications  New Prescriptions   No medications on file  Previous Medications   AMIODARONE (PACERONE) 200 MG TABLET    Take 200 mg by mouth daily. Take one tablet daily for heart.   ASPIRIN 81 MG TABLET    Take 81 mg by mouth daily.   ATORVASTATIN (LIPITOR) 10 MG TABLET    Take 10 mg by mouth every Monday, Wednesday, and Friday.    CARBOXYMETH-GLYCERIN-POLYSORB (REFRESH OPTIVE ADVANCED) 0.5-1-0.5 % SOLN    Apply 1 Drop/m2 to eye as needed (Install 1 drop, both eyes 2-3 times daily as needed.).   DOCUSATE SODIUM (COLACE) 100 MG CAPSULE    Take 100 mg by mouth 2 (two) times daily.   EYELID CLEANSERS (OCUSOFT LID SCRUB PLUS) PADS    Apply topically 2 (two) times daily. Use to eyes twice daily in morning and evening.   INSULIN ASPART (NOVOLOG) 100 UNIT/ML INJECTION    Inject 5-8 Units into the skin 3 (three) times daily before  meals. 5units with breakfast and dinner, 8 units with lunch for CBG>150   INSULIN DETEMIR (LEVEMIR FLEXPEN) 100 UNIT/ML INJECTION    Inject 14 Units into the skin at bedtime.    LEVOTHYROXINE (SYNTHROID, LEVOTHROID) 25 MCG TABLET    Take 25 mcg by mouth daily before breakfast.   NUTRITIONAL SUPPLEMENTS (BOOST DIABETIC) LIQD    Take by mouth. Drink 1 can daily.   OMEPRAZOLE (PRILOSEC) 20 MG CAPSULE    Take 20 mg by mouth daily.     POLYVINYL ALCOHOL (LIQUIFILM TEARS) 1.4 % OPHTHALMIC SOLUTION    Place 2 drops into both eyes as needed for dry eyes (Every 2 hours as needed.).   VITAMIN B-12 (CYANOCOBALAMIN) 1000 MCG TABLET    Take 1,000 mcg by mouth daily.    Modified Medications   No medications on file  Discontinued Medications   No medications on file     Physical Exam: Physical Exam  Constitutional: He is oriented to person, place, and time. He appears well-developed and well-nourished.  HENT:  Head: Normocephalic and atraumatic.  Eyes: Conjunctivae and EOM are normal.  Pupils are equal, round, and reactive to light.  Neck: Normal range of motion. Neck supple. No JVD present. No thyromegaly present.  Cardiovascular: Normal rate.  An irregular rhythm present.  Murmur heard.  Systolic murmur is present with a grade of 2/6  Pulmonary/Chest: Effort normal. He has no wheezes. He has rales (bibailar dry rales). He exhibits no tenderness.  Abdominal: Soft. Bowel sounds are normal. There is no tenderness.  Musculoskeletal: Normal range of motion. He exhibits no edema and no tenderness.  Lymphadenopathy:    He has no cervical adenopathy.  Neurological: He is alert and oriented to person, place, and time. He has normal reflexes. No cranial nerve deficit. He exhibits normal muscle tone. Coordination normal.  Skin: Skin is warm and dry. No rash noted. No erythema.  Small superficial left buttock pressure ulcer stage II healing.    Psychiatric: He has a normal mood and affect. His behavior is normal. Judgment and thought content normal.    Filed Vitals:   12/04/13 1555  BP: 106/70  Pulse: 64  Temp: 98.2 F (36.8 C)  TempSrc: Tympanic  Resp: 18   Labs reviewed: Basic Metabolic Panel:  Recent Labs  03/16/13 05/10/13 08/15/13 09/12/13  NA 138 135* 138  --   K 4.4 4.2 4.3  --   BUN 55* 55* 49*  --   CREATININE 2.3* 2.5* 2.5*  --   TSH  --   --   --  5.15   Liver Function Tests:  Recent Labs  03/16/13 05/10/13 08/15/13  AST 17 18 15   ALT 12 15 13   ALKPHOS 84 104 87   CBC:  Recent Labs  03/16/13 05/10/13 08/15/13  WBC 9.4 6.0 8.9  HGB 11.2* 10.7* 10.0*  HCT 33* 32* 30*  PLT 233 194 283   Lipid Panel:  Recent Labs  03/14/13  CHOL 196  HDL 37  LDLCALC 128  TRIG 157    Past Procedures:  08/09/13 CXR minimal cardiomegaly appears new without pulmonary vascular congestion or pleural effusion, mild elevation left hemidiaphragm unchanged, no inflammatory consolidate or suspicious nodule, mild atelectasis right lung base and left perihilar  region.   Assessment/Plan DIABETES MELLITUS, TYPE II Bun/creat 55/2.48 05/10/13 and 59/2.49 08/15/13. Update BMP and Hgb A1c pending.   Chronic renal disease, stage III Bun/creat 55/2.48 05/10/13 and 59/2.49 08/15/13. Pending BMP    Unspecified hypothyroidism Update  TSH pending and observe the patient. Continue Levothyroxine 55mcg.    Paroxysmal supraventricular tachycardia Rate controlled.    GERD (gastroesophageal reflux disease) Stable. Continue PPI   Unspecified constipation Stable, takes Colace 100mg  bid      Family/ Staff Communication: observe the patient  Goals of Care: SNF  Labs/tests ordered: pending TSH, BMP, A1c, CBC

## 2013-12-04 NOTE — Assessment & Plan Note (Addendum)
Bun/creat 55/2.48 05/10/13 and 59/2.49 08/15/13. Update BMP and Hgb A1c pending.

## 2013-12-04 NOTE — Assessment & Plan Note (Signed)
Rate controlled 

## 2013-12-04 NOTE — Assessment & Plan Note (Signed)
Bun/creat 55/2.48 05/10/13 and 59/2.49 08/15/13. Pending BMP

## 2013-12-04 NOTE — Assessment & Plan Note (Signed)
Update TSH pending and observe the patient. Continue Levothyroxine 48mcg.

## 2013-12-07 LAB — BASIC METABOLIC PANEL
BUN: 62 mg/dL — AB (ref 4–21)
CREATININE: 2.6 mg/dL — AB (ref 0.6–1.3)
GLUCOSE: 142 mg/dL
POTASSIUM: 4.4 mmol/L (ref 3.4–5.3)
Sodium: 136 mmol/L — AB (ref 137–147)

## 2013-12-07 LAB — CBC AND DIFFERENTIAL
HEMATOCRIT: 31 % — AB (ref 41–53)
Hemoglobin: 10.7 g/dL — AB (ref 13.5–17.5)
PLATELETS: 198 10*3/uL (ref 150–399)
WBC: 6.7 10*3/mL

## 2013-12-07 LAB — TSH: TSH: 3.06 u[IU]/mL (ref 0.41–5.90)

## 2013-12-07 LAB — HEMOGLOBIN A1C: Hgb A1c MFr Bld: 6.5 % — AB (ref 4.0–6.0)

## 2013-12-11 ENCOUNTER — Other Ambulatory Visit: Payer: Self-pay | Admitting: Nurse Practitioner

## 2013-12-11 DIAGNOSIS — E1129 Type 2 diabetes mellitus with other diabetic kidney complication: Secondary | ICD-10-CM

## 2013-12-11 DIAGNOSIS — E1122 Type 2 diabetes mellitus with diabetic chronic kidney disease: Secondary | ICD-10-CM

## 2013-12-11 DIAGNOSIS — E119 Type 2 diabetes mellitus without complications: Secondary | ICD-10-CM

## 2013-12-18 ENCOUNTER — Non-Acute Institutional Stay (SKILLED_NURSING_FACILITY): Payer: Medicare Other | Admitting: Nurse Practitioner

## 2013-12-18 ENCOUNTER — Encounter: Payer: Self-pay | Admitting: Nurse Practitioner

## 2013-12-18 DIAGNOSIS — E1129 Type 2 diabetes mellitus with other diabetic kidney complication: Secondary | ICD-10-CM

## 2013-12-18 DIAGNOSIS — N039 Chronic nephritic syndrome with unspecified morphologic changes: Secondary | ICD-10-CM

## 2013-12-18 DIAGNOSIS — N189 Chronic kidney disease, unspecified: Secondary | ICD-10-CM

## 2013-12-18 DIAGNOSIS — I471 Supraventricular tachycardia: Secondary | ICD-10-CM

## 2013-12-18 DIAGNOSIS — L22 Diaper dermatitis: Secondary | ICD-10-CM

## 2013-12-18 DIAGNOSIS — E1122 Type 2 diabetes mellitus with diabetic chronic kidney disease: Secondary | ICD-10-CM

## 2013-12-18 DIAGNOSIS — E1121 Type 2 diabetes mellitus with diabetic nephropathy: Secondary | ICD-10-CM

## 2013-12-18 DIAGNOSIS — K219 Gastro-esophageal reflux disease without esophagitis: Secondary | ICD-10-CM

## 2013-12-18 DIAGNOSIS — B372 Candidiasis of skin and nail: Secondary | ICD-10-CM | POA: Insufficient documentation

## 2013-12-18 DIAGNOSIS — E039 Hypothyroidism, unspecified: Secondary | ICD-10-CM

## 2013-12-18 DIAGNOSIS — D631 Anemia in chronic kidney disease: Secondary | ICD-10-CM

## 2013-12-18 DIAGNOSIS — K5900 Constipation, unspecified: Secondary | ICD-10-CM

## 2013-12-18 DIAGNOSIS — N058 Unspecified nephritic syndrome with other morphologic changes: Secondary | ICD-10-CM

## 2013-12-18 NOTE — Progress Notes (Signed)
Patient ID: BLADIMIR AUMAN, male   DOB: 1921-03-20, 80 y.o.   MRN: [id number]   Code Status: DNR  Allergies  Allergen Reactions  . Celebrex [Celecoxib]     Chief Complaint  Patient presents with  . Medical Management of Chronic Issues  . Acute Visit    groin and buttokcs rash.     HPI: Patient is a 80 y.o. male seen in the SNF at [facility] today for evaluation of groin/buttocks rash and chronic medical conditions.  Problem List Items Addressed This Visit   Anemia     12/07/13 Hgb 10.7     GERD (gastroesophageal reflux disease)     Stable. Continue PPI     Paroxysmal supraventricular tachycardia     Rate controlled.       Type 2 diabetes mellitus with diabetic chronic kidney disease     12/07/13 Hgb A1c 6.5. Continue Levemir 14 u qd, Novolog 8 u with lunch, 5 u with breakfast and dinner for CBG>150    Type II diabetes mellitus with renal manifestations     Bun/creat 55/2.48 05/10/13 and 59/2.49 08/15/13 and Bun/creat 62/2.62 12/07/13. Hgb A1c 6.5 12/07/13      Unspecified constipation     Stable, takes Colace 100mg  bid     Unspecified hypothyroidism     Levothyroxine 53mcg. 12/07/13 TSH 3.062       Diaper candidiasis - Primary     Assists the patient with good personal hygiene. Leave adult depend off while in bed. Apply Nystatin to affected area bid. Oral Diflucan 100mg  qd x 3days.        Review of Systems:  Review of Systems  Constitutional: Negative for fever, chills, weight loss, malaise/fatigue and diaphoresis.  HENT: Positive for hearing loss. Negative for congestion, ear pain and sore throat.   Eyes: Negative for pain, discharge and redness.  Respiratory: Negative for cough, sputum production, shortness of breath and wheezing.   Cardiovascular: Positive for PND. Negative for chest pain, palpitations, orthopnea, claudication and leg swelling.       Resolved bradycardia-HR 60s  Gastrointestinal: Positive for heartburn. Negative for nausea, vomiting,  abdominal pain, diarrhea and constipation.       Occasionally.   Genitourinary: Positive for frequency. Negative for dysuria, urgency, hematuria and flank pain.  Musculoskeletal: Positive for back pain and joint pain. Negative for falls, myalgias and neck pain.  Skin: Positive for rash. Negative for itching.       Small superficial right buttock pressure ulcer stage II healing. Rash: groin L>R, buttocks, skin tag R intergluteal cleft.   Neurological: Negative for dizziness, tingling, tremors, sensory change, speech change, focal weakness, seizures, loss of consciousness, weakness and headaches.  Endo/Heme/Allergies: Negative for environmental allergies and polydipsia. Does not bruise/bleed easily.  Psychiatric/Behavioral: Positive for memory loss. Negative for depression and hallucinations. The patient is not nervous/anxious and does not have insomnia.        Increased confusion     Past Medical History  Diagnosis Date  . CVA (cerebral infarction) 1981    MI  . Type II or unspecified type diabetes mellitus without mention of complication, not stated as uncontrolled   . Diverticulosis   . History of GI diverticular bleed 2007  . HTN (hypertension)   . AAA (abdominal aortic aneurysm)     CT 2006 3.2 cm  . Bladder cancer     Transitional cell carcinoma excised 2006  . Renal malignant tumor     transitional cell carcinoma lasered 2006  .  Hyperlipidemia   . Hydrocele, left   . Inguinal hernia   . Coronary artery disease 05/05/2011    Echo EF 50%- 55% mild inferiof wall hypokinesis,mild dilatation left artium,mild mitral annular calcif. mild MR, mild TR, and mild calcif. /sclerosis trileaflet aortic valve w/ trace aortic insufficiency  . Dysrhythmia     af  . Anemia   . GERD (gastroesophageal reflux disease)   . Arthritis   . Myocardial infarction 1981    inferior-wall MI   . Atrial fibrillation 07/04/2010    cardioversion - successful  normal sinus rhythm; May 2012 discontinue  Coumadin  and increaseAspirin  . Peripheral vascular disease 11/13/2010    Abd aortic doppler mild abd aortic aneurysm 3.1 x 2.9cm  . Coronary artery disease 10/01/2009    Persantaine myview-EF 53% moderate perfusion d/t infaract/scar w/mild perinfaract ischemia seen Mid Anterior ,Mid Inferior, Apical Inferior , Basal and Mid inferolateral, Apical Lateral region  . Hypopotassemia 06/22/2012  . Edema 06/22/2012  . Congestive heart failure, unspecified 06/13/2012  . Olecranon bursitis 02/03/2012  . Pneumonia, organism unspecified 01/20/2012  . Neoplasm of uncertain behavior of skin 10/19/2011  . Unspecified venous (peripheral) insufficiency 08/26/2011  . Unspecified constipation 07/29/2011  . Lumbago 07/09/2011  . Personal history of fall 06/25/2011  . Unspecified malignant neoplasm of scalp and skin of neck 06/11/2011  . Other acquired deformity of ankle and foot(736.79) 06/11/2011  . Other alteration of consciousness 06/11/2011  . Hyposmolality and/or hyponatremia 05/07/2011  . Major depressive disorder, single episode, unspecified 05/07/2011  . Abnormality of gait 04/27/2011  . Other B-complex deficiencies 04/09/2011  . Senile dementia, uncomplicated 00/86/7619  . Phlebitis and thrombophlebitis of other deep vessels of lower extremities 04/09/2011  . Hemorrhage of rectum and anus 04/09/2011  . Loss of weight 04/09/2011  . Anemia, unspecified 9/8/209  . Malignant neoplasm of bladder, part unspecified 04/08/2008  . Malignant neoplasm of kidney, except pelvis 04/08/2005   Past Surgical History  Procedure Laterality Date  . Coronary artery bypass graft  '94    LIMA-LAD, SVG-OM, PD, RCA  . Cataract extraction  2007  . Renal laser ablation of tumor  2006  . Coronary artery bypass graft  1994    LIMA to LAD, vein to the marginal, vein to the PDA, vein to the PLA   . Appendectomy    . Eye surgery    . Incision and drainage of wound  09/10/2011    Procedure: IRRIGATION AND DEBRIDEMENT  WOUND;  Surgeon: Theodoro Kos, DO;  Location: [place];  Service: Plastics;  Laterality: N/A;  irrigation and debridement scalp wound with removal of bone and placement of intregra or acell and vac   Social History:   reports that he has quit smoking. He does not have any smokeless tobacco history on file. He reports that he does not drink alcohol or use illicit drugs.  Family History  Problem Relation Age of Onset  . Diabetes Mother   . Diabetes Daughter     Medications: Patient's Medications  New Prescriptions   No medications on file  Previous Medications   AMIODARONE (PACERONE) 200 MG TABLET    Take 200 mg by mouth daily. Take one tablet daily for heart.   ASPIRIN 81 MG TABLET    Take 81 mg by mouth daily.   ATORVASTATIN (LIPITOR) 10 MG TABLET    Take 10 mg by mouth every Monday, Wednesday, and Friday.    CARBOXYMETH-GLYCERIN-POLYSORB (REFRESH OPTIVE ADVANCED) 0.5-1-0.5 % SOLN  Apply 1 Drop/m2 to eye as needed (Install 1 drop, both eyes 2-3 times daily as needed.).   DOCUSATE SODIUM (COLACE) 100 MG CAPSULE    Take 100 mg by mouth 2 (two) times daily.   EYELID CLEANSERS (OCUSOFT LID SCRUB PLUS) PADS    Apply topically 2 (two) times daily. Use to eyes twice daily in morning and evening.   INSULIN ASPART (NOVOLOG) 100 UNIT/ML INJECTION    Inject 5-8 Units into the skin 3 (three) times daily before meals. 5units with breakfast and dinner, 8 units with lunch for CBG>150   INSULIN DETEMIR (LEVEMIR FLEXPEN) 100 UNIT/ML INJECTION    Inject 14 Units into the skin at bedtime.    LEVOTHYROXINE (SYNTHROID, LEVOTHROID) 25 MCG TABLET    Take 25 mcg by mouth daily before breakfast.   NUTRITIONAL SUPPLEMENTS (BOOST DIABETIC) LIQD    Take by mouth. Drink 1 can daily.   OMEPRAZOLE (PRILOSEC) 20 MG CAPSULE    Take 20 mg by mouth daily.     POLYVINYL ALCOHOL (LIQUIFILM TEARS) 1.4 % OPHTHALMIC SOLUTION    Place 2 drops into both eyes as needed for dry eyes (Every 2 hours as needed.).    VITAMIN B-12 (CYANOCOBALAMIN) 1000 MCG TABLET    Take 1,000 mcg by mouth daily.    Modified Medications   No medications on file  Discontinued Medications   No medications on file     Physical Exam: Physical Exam  Constitutional: He is oriented to person, place, and time. He appears well-developed and well-nourished.  HENT:  Head: Normocephalic and atraumatic.  Eyes: Conjunctivae and EOM are normal. Pupils are equal, round, and reactive to light.  Neck: Normal range of motion. Neck supple. No JVD present. No thyromegaly present.  Cardiovascular: Normal rate.  An irregular rhythm present.  Murmur heard.  Systolic murmur is present with a grade of 2/6  Pulmonary/Chest: Effort normal. He has no wheezes. He has rales (bibailar dry rales). He exhibits no tenderness.  Abdominal: Soft. Bowel sounds are normal. There is no tenderness.  Musculoskeletal: Normal range of motion. He exhibits no edema and no tenderness.  Lymphadenopathy:    He has no cervical adenopathy.  Neurological: He is alert and oriented to person, place, and time. He has normal reflexes. No cranial nerve deficit. He exhibits normal muscle tone. Coordination normal.  Skin: Skin is warm and dry. No rash noted. No erythema.   Small superficial right buttock pressure ulcer stage II healing. Rash: groin L>R, buttocks, skin tag R intergluteal cleft.   Psychiatric: He has a normal mood and affect. His behavior is normal. Judgment and thought content normal.    Filed Vitals:   12/18/13 1509  BP: 140/72  Pulse: 68  Temp: 97.6 F (36.4 C)  TempSrc: Tympanic  Resp: 18   Labs reviewed: Basic Metabolic Panel:  Recent Labs  05/10/13 08/15/13 09/12/13 12/07/13  NA 135* 138  --  136*  K 4.2 4.3  --  4.4  BUN 55* 49*  --  62*  CREATININE 2.5* 2.5*  --  2.6*  TSH  --   --  5.15 3.06   Liver Function Tests:  Recent Labs  03/16/13 05/10/13 08/15/13  AST 17 18 15   ALT 12 15 13   ALKPHOS 84 104 87   CBC:  Recent Labs   05/10/13 08/15/13 12/07/13  WBC 6.0 8.9 6.7  HGB 10.7* 10.0* 10.7*  HCT 32* 30* 31*  PLT 194 283 198   Lipid Panel:  Recent Labs  03/14/13  CHOL 196  HDL 37  LDLCALC 128  TRIG 157    Past Procedures:  08/09/13 CXR minimal cardiomegaly appears new without pulmonary vascular congestion or pleural effusion, mild elevation left hemidiaphragm unchanged, no inflammatory consolidate or suspicious nodule, mild atelectasis right lung base and left perihilar region.   Assessment/Plan Diaper candidiasis Assists the patient with good personal hygiene. Leave adult depend off while in bed. Apply Nystatin to affected area bid. Oral Diflucan 100mg  qd x 3days.   Type II diabetes mellitus with renal manifestations Bun/creat 55/2.48 05/10/13 and 59/2.49 08/15/13 and Bun/creat 62/2.62 12/07/13. Hgb A1c 6.5 12/07/13    Unspecified hypothyroidism Levothyroxine 35mcg. 12/07/13 TSH 3.062     Type 2 diabetes mellitus with diabetic chronic kidney disease 12/07/13 Hgb A1c 6.5. Continue Levemir 14 u qd, Novolog 8 u with lunch, 5 u with breakfast and dinner for CBG>150  GERD (gastroesophageal reflux disease) Stable. Continue PPI   Paroxysmal supraventricular tachycardia Rate controlled.     Unspecified constipation Stable, takes Colace 100mg  bid   Anemia 12/07/13 Hgb 10.7     Family/ Staff Communication: observe the patient  Goals of Care: SNF  Labs/tests ordered: none

## 2013-12-18 NOTE — Assessment & Plan Note (Signed)
12/07/13 Hgb 10.7

## 2013-12-18 NOTE — Assessment & Plan Note (Signed)
12/07/13 Hgb A1c 6.5. Continue Levemir 14 u qd, Novolog 8 u with lunch, 5 u with breakfast and dinner for CBG>150

## 2013-12-18 NOTE — Assessment & Plan Note (Signed)
Stable.  Continue PPI. 

## 2013-12-18 NOTE — Assessment & Plan Note (Signed)
Assists the patient with good personal hygiene. Leave adult depend off while in bed. Apply Nystatin to affected area bid. Oral Diflucan 100mg  qd x 3days.

## 2013-12-18 NOTE — Assessment & Plan Note (Signed)
Rate controlled 

## 2013-12-18 NOTE — Assessment & Plan Note (Signed)
Levothyroxine 32mcg. 12/07/13 TSH 3.062

## 2013-12-18 NOTE — Assessment & Plan Note (Signed)
Bun/creat 55/2.48 05/10/13 and 59/2.49 08/15/13 and Bun/creat 62/2.62 12/07/13. Hgb A1c 6.5 12/07/13

## 2013-12-18 NOTE — Assessment & Plan Note (Signed)
Stable, takes Colace 100mg bid.   

## 2014-01-22 ENCOUNTER — Encounter: Payer: Self-pay | Admitting: Nurse Practitioner

## 2014-01-22 ENCOUNTER — Non-Acute Institutional Stay (SKILLED_NURSING_FACILITY): Payer: Medicare Other | Admitting: Nurse Practitioner

## 2014-01-22 DIAGNOSIS — E119 Type 2 diabetes mellitus without complications: Secondary | ICD-10-CM

## 2014-01-22 DIAGNOSIS — K5900 Constipation, unspecified: Secondary | ICD-10-CM

## 2014-01-22 DIAGNOSIS — K219 Gastro-esophageal reflux disease without esophagitis: Secondary | ICD-10-CM

## 2014-01-22 DIAGNOSIS — E039 Hypothyroidism, unspecified: Secondary | ICD-10-CM

## 2014-01-22 NOTE — Assessment & Plan Note (Signed)
Stable. Continue Omeprazole 20mg  bid.

## 2014-01-22 NOTE — Progress Notes (Signed)
Patient ID: [name] [name], male   DOB: [birth year], 80 y.o.   MRN: [id number]   Code Status: DNR  Allergies  Allergen Reactions  . Celebrex [Celecoxib]     Chief Complaint  Patient presents with  . Medical Management of Chronic Issues    HPI: Patient is a 80 y.o. male seen in the SNF at [facility] today for evaluation of chronic medical conditions.  Problem List Items Addressed This Visit   DIABETES MELLITUS, TYPE II - Primary (Chronic)     12/07/13 Hgb A1c 6.5. Continue Levemir 14 u qd, Novolog 8 u with lunch, 5 u with breakfast and dinner for CBG>150     GERD (gastroesophageal reflux disease)     Stable. Continue Omeprazole 20mg  bid.       Unspecified constipation     Stable, takes Colace 100mg  bid      Unspecified hypothyroidism     Levothyroxine 65mcg. 12/07/13 TSH 3.062         Review of Systems:  Review of Systems  Constitutional: Negative for fever, chills, weight loss, malaise/fatigue and diaphoresis.  HENT: Positive for hearing loss. Negative for congestion, ear pain and sore throat.   Eyes: Negative for pain, discharge and redness.  Respiratory: Negative for cough, sputum production, shortness of breath and wheezing.   Cardiovascular: Positive for PND. Negative for chest pain, palpitations, orthopnea, claudication and leg swelling.       Resolved bradycardia-HR 60s  Gastrointestinal: Positive for heartburn. Negative for nausea, vomiting, abdominal pain, diarrhea and constipation.       Occasionally.   Genitourinary: Positive for frequency. Negative for dysuria, urgency, hematuria and flank pain.  Musculoskeletal: Positive for back pain and joint pain. Negative for falls, myalgias and neck pain.  Skin: Positive for rash. Negative for itching.       Small superficial right buttock pressure ulcer stage II healing. Rash: groin L>R, buttocks, skin tag R intergluteal-healing.   Neurological: Negative for dizziness, tingling, tremors, sensory change, speech  change, focal weakness, seizures, loss of consciousness, weakness and headaches.  Endo/Heme/Allergies: Negative for environmental allergies and polydipsia. Does not bruise/bleed easily.  Psychiatric/Behavioral: Positive for memory loss. Negative for depression and hallucinations. The patient is not nervous/anxious and does not have insomnia.        Increased confusion     Past Medical History  Diagnosis Date  . CVA (cerebral infarction) 1981    MI  . Type II or unspecified type diabetes mellitus without mention of complication, not stated as uncontrolled   . Diverticulosis   . History of GI diverticular bleed 2007  . HTN (hypertension)   . AAA (abdominal aortic aneurysm)     CT 2006 3.2 cm  . Bladder cancer     Transitional cell carcinoma excised 2006  . Renal malignant tumor     transitional cell carcinoma lasered 2006  . Hyperlipidemia   . Hydrocele, left   . Inguinal hernia   . Coronary artery disease 05/05/2011    Echo EF 50%- 55% mild inferiof wall hypokinesis,mild dilatation left artium,mild mitral annular calcif. mild MR, mild TR, and mild calcif. /sclerosis trileaflet aortic valve w/ trace aortic insufficiency  . Dysrhythmia     af  . Anemia   . GERD (gastroesophageal reflux disease)   . Arthritis   . Myocardial infarction 1981    inferior-wall MI   . Atrial fibrillation 07/04/2010    cardioversion - successful  normal sinus rhythm; May 2012 discontinue Coumadin  and increaseAspirin  .  Peripheral vascular disease 11/13/2010    Abd aortic doppler mild abd aortic aneurysm 3.1 x 2.9cm  . Coronary artery disease 10/01/2009    Persantaine myview-EF 53% moderate perfusion d/t infaract/scar w/mild perinfaract ischemia seen Mid Anterior ,Mid Inferior, Apical Inferior , Basal and Mid inferolateral, Apical Lateral region  . Hypopotassemia 06/22/2012  . Edema 06/22/2012  . Congestive heart failure, unspecified 06/13/2012  . Olecranon bursitis 02/03/2012  . Pneumonia, organism  unspecified 01/20/2012  . Neoplasm of uncertain behavior of skin 10/19/2011  . Unspecified venous (peripheral) insufficiency 08/26/2011  . Unspecified constipation 07/29/2011  . Lumbago 07/09/2011  . Personal history of fall 06/25/2011  . Unspecified malignant neoplasm of scalp and skin of neck 06/11/2011  . Other acquired deformity of ankle and foot(736.79) 06/11/2011  . Other alteration of consciousness 06/11/2011  . Hyposmolality and/or hyponatremia 05/07/2011  . Major depressive disorder, single episode, unspecified 05/07/2011  . Abnormality of gait 04/27/2011  . Other B-complex deficiencies 04/09/2011  . Senile dementia, uncomplicated 16/03/9603  . Phlebitis and thrombophlebitis of other deep vessels of lower extremities 04/09/2011  . Hemorrhage of rectum and anus 04/09/2011  . Loss of weight 04/09/2011  . Anemia, unspecified 9/8/209  . Malignant neoplasm of bladder, part unspecified 04/08/2008  . Malignant neoplasm of kidney, except pelvis 04/08/2005   Past Surgical History  Procedure Laterality Date  . Coronary artery bypass graft  '94    LIMA-LAD, SVG-OM, PD, RCA  . Cataract extraction  2007  . Renal laser ablation of tumor  2006  . Coronary artery bypass graft  1994    LIMA to LAD, vein to the marginal, vein to the PDA, vein to the PLA   . Appendectomy    . Eye surgery    . Incision and drainage of wound  09/10/2011    Procedure: IRRIGATION AND DEBRIDEMENT WOUND;  Surgeon: Theodoro Kos, DO;  Location: [place];  Service: Plastics;  Laterality: N/A;  irrigation and debridement scalp wound with removal of bone and placement of intregra or acell and vac   Social History:   reports that he has quit smoking. He does not have any smokeless tobacco history on file. He reports that he does not drink alcohol or use illicit drugs.  Family History  Problem Relation Age of Onset  . Diabetes Mother   . Diabetes Daughter     Medications: Patient's Medications  New  Prescriptions   No medications on file  Previous Medications   AMIODARONE (PACERONE) 200 MG TABLET    Take 200 mg by mouth daily. Take one tablet daily for heart.   ASPIRIN 81 MG TABLET    Take 81 mg by mouth daily.   ATORVASTATIN (LIPITOR) 10 MG TABLET    Take 10 mg by mouth every Monday, Wednesday, and Friday.    CARBOXYMETH-GLYCERIN-POLYSORB (REFRESH OPTIVE ADVANCED) 0.5-1-0.5 % SOLN    Apply 1 Drop/m2 to eye as needed (Install 1 drop, both eyes 2-3 times daily as needed.).   DOCUSATE SODIUM (COLACE) 100 MG CAPSULE    Take 100 mg by mouth 2 (two) times daily.   EYELID CLEANSERS (OCUSOFT LID SCRUB PLUS) PADS    Apply topically 2 (two) times daily. Use to eyes twice daily in morning and evening.   INSULIN ASPART (NOVOLOG) 100 UNIT/ML INJECTION    Inject 5-8 Units into the skin 3 (three) times daily before meals. 5units with breakfast and dinner, 8 units with lunch for CBG>150   INSULIN DETEMIR (LEVEMIR FLEXPEN) 100 UNIT/ML INJECTION  Inject 14 Units into the skin at bedtime.    LEVOTHYROXINE (SYNTHROID, LEVOTHROID) 25 MCG TABLET    Take 25 mcg by mouth daily before breakfast.   NUTRITIONAL SUPPLEMENTS (BOOST DIABETIC) LIQD    Take by mouth. Drink 1 can daily.   OMEPRAZOLE (PRILOSEC) 20 MG CAPSULE    Take 20 mg by mouth daily.     POLYVINYL ALCOHOL (LIQUIFILM TEARS) 1.4 % OPHTHALMIC SOLUTION    Place 2 drops into both eyes as needed for dry eyes (Every 2 hours as needed.).   VITAMIN B-12 (CYANOCOBALAMIN) 1000 MCG TABLET    Take 1,000 mcg by mouth daily.    Modified Medications   No medications on file  Discontinued Medications   No medications on file     Physical Exam: Physical Exam  Constitutional: He is oriented to person, place, and time. He appears well-developed and well-nourished.  HENT:  Head: Normocephalic and atraumatic.  Eyes: Conjunctivae and EOM are normal. Pupils are equal, round, and reactive to light.  Neck: Normal range of motion. Neck supple. No JVD present. No  thyromegaly present.  Cardiovascular: Normal rate.  An irregular rhythm present.  Murmur heard.  Systolic murmur is present with a grade of 2/6  Pulmonary/Chest: Effort normal. He has no wheezes. He has rales (bibailar dry rales). He exhibits no tenderness.  Abdominal: Soft. Bowel sounds are normal. There is no tenderness.  Musculoskeletal: Normal range of motion. He exhibits no edema and no tenderness.  Lymphadenopathy:    He has no cervical adenopathy.  Neurological: He is alert and oriented to person, place, and time. He has normal reflexes. No cranial nerve deficit. He exhibits normal muscle tone. Coordination normal.  Skin: Skin is warm and dry. No rash noted. No erythema.  Small superficial right buttock pressure ulcer stage II healing. Rash: groin L>R, buttocks, skin tag R intergluteal cleft-healing   Psychiatric: He has a normal mood and affect. His behavior is normal. Judgment and thought content normal.    Filed Vitals:   01/22/14 1002  BP: 118/60  Pulse: 62  Temp: 96.5 F (35.8 C)  TempSrc: Tympanic  Resp: 16   Labs reviewed: Basic Metabolic Panel:  Recent Labs  05/10/13 08/15/13 09/12/13 12/07/13  NA 135* 138  --  136*  K 4.2 4.3  --  4.4  BUN 55* 49*  --  62*  CREATININE 2.5* 2.5*  --  2.6*  TSH  --   --  5.15 3.06   Liver Function Tests:  Recent Labs  03/16/13 05/10/13 08/15/13  AST 17 18 15   ALT 12 15 13   ALKPHOS 84 104 87   CBC:  Recent Labs  05/10/13 08/15/13 12/07/13  WBC 6.0 8.9 6.7  HGB 10.7* 10.0* 10.7*  HCT 32* 30* 31*  PLT 194 283 198   Lipid Panel:  Recent Labs  03/14/13  CHOL 196  HDL 37  LDLCALC 128  TRIG 157    Past Procedures:  08/09/13 CXR minimal cardiomegaly appears new without pulmonary vascular congestion or pleural effusion, mild elevation left hemidiaphragm unchanged, no inflammatory consolidate or suspicious nodule, mild atelectasis right lung base and left perihilar region.   Assessment/Plan DIABETES MELLITUS,  TYPE II 12/07/13 Hgb A1c 6.5. Continue Levemir 14 u qd, Novolog 8 u with lunch, 5 u with breakfast and dinner for CBG>150   Unspecified hypothyroidism Levothyroxine 41mcg. 12/07/13 TSH 3.062    GERD (gastroesophageal reflux disease) Stable. Continue Omeprazole 20mg  bid.     Unspecified constipation Stable, takes  Colace 100mg  bid      Family/ Staff Communication: observe the patient  Goals of Care: SNF  Labs/tests ordered: none

## 2014-01-22 NOTE — Assessment & Plan Note (Signed)
Stable, takes Colace 100mg bid.   

## 2014-01-22 NOTE — Assessment & Plan Note (Signed)
Levothyroxine 37mcg. 12/07/13 TSH 3.062

## 2014-01-22 NOTE — Assessment & Plan Note (Signed)
12/07/13 Hgb A1c 6.5. Continue Levemir 14 u qd, Novolog 8 u with lunch, 5 u with breakfast and dinner for CBG>150

## 2014-02-12 ENCOUNTER — Other Ambulatory Visit: Payer: Self-pay | Admitting: Nurse Practitioner

## 2014-02-19 ENCOUNTER — Encounter: Payer: Self-pay | Admitting: Nurse Practitioner

## 2014-02-19 ENCOUNTER — Non-Acute Institutional Stay (SKILLED_NURSING_FACILITY): Payer: Medicare Other | Admitting: Nurse Practitioner

## 2014-02-19 DIAGNOSIS — N183 Chronic kidney disease, stage 3 unspecified: Secondary | ICD-10-CM

## 2014-02-19 DIAGNOSIS — E039 Hypothyroidism, unspecified: Secondary | ICD-10-CM

## 2014-02-19 DIAGNOSIS — K5900 Constipation, unspecified: Secondary | ICD-10-CM

## 2014-02-19 DIAGNOSIS — K219 Gastro-esophageal reflux disease without esophagitis: Secondary | ICD-10-CM

## 2014-02-19 DIAGNOSIS — E119 Type 2 diabetes mellitus without complications: Secondary | ICD-10-CM

## 2014-02-19 NOTE — Assessment & Plan Note (Signed)
12/07/13 Hgb 10.7

## 2014-02-19 NOTE — Assessment & Plan Note (Signed)
Levothyroxine 65mcg. 12/07/13 TSH 3.062

## 2014-02-19 NOTE — Progress Notes (Signed)
Patient ID: [name] [name], male   DOB: 10/31/1920, 80 y.o.   MRN: [id number]   Code Status: DNR  Allergies  Allergen Reactions  . Celebrex [Celecoxib]     Chief Complaint  Patient presents with  . Medical Management of Chronic Issues    HPI: Patient is a 80 y.o. male seen in the SNF at [facility] today for evaluation of chronic medical conditions.  Problem List Items Addressed This Visit   Unspecified hypothyroidism - Primary     Levothyroxine 66mcg. 12/07/13 TSH 3.062       Unspecified constipation     Stable, takes Colace 100mg  bid      GERD (gastroesophageal reflux disease)     Stable. Continue Omeprazole 20mg  bid.       DIABETES MELLITUS, TYPE II (Chronic)     12/07/13 Hgb A1c 6.5. Continue Levemir 14 u qd, Novolog 8 u with lunch, 5 u with breakfast and dinner for CBG>150      Chronic renal disease, stage III (Chronic)     Bun/creat 55/2.48 05/10/13  Bun/creat  59/2.49 08/15/13.  Bun/creat 62/2.6 12/07/13          Review of Systems:  Review of Systems  Constitutional: Negative for fever, chills, weight loss, malaise/fatigue and diaphoresis.  HENT: Positive for hearing loss. Negative for congestion, ear pain and sore throat.   Eyes: Negative for pain, discharge and redness.  Respiratory: Negative for cough, sputum production, shortness of breath and wheezing.   Cardiovascular: Positive for PND. Negative for chest pain, palpitations, orthopnea, claudication and leg swelling.       Resolved bradycardia-HR 60s  Gastrointestinal: Positive for heartburn. Negative for nausea, vomiting, abdominal pain, diarrhea and constipation.       Occasionally.   Genitourinary: Positive for frequency. Negative for dysuria, urgency, hematuria and flank pain.  Musculoskeletal: Positive for back pain and joint pain. Negative for falls, myalgias and neck pain.  Skin: Positive for rash. Negative for itching.       Small superficial right buttock pressure ulcer stage II  healing. Rash: groin L>R, buttocks, skin tag R intergluteal-healing.   Neurological: Negative for dizziness, tingling, tremors, sensory change, speech change, focal weakness, seizures, loss of consciousness, weakness and headaches.  Endo/Heme/Allergies: Negative for environmental allergies and polydipsia. Does not bruise/bleed easily.  Psychiatric/Behavioral: Positive for memory loss. Negative for depression and hallucinations. The patient is not nervous/anxious and does not have insomnia.        Increased confusion     Past Medical History  Diagnosis Date  . CVA (cerebral infarction) 1981    MI  . Type II or unspecified type diabetes mellitus without mention of complication, not stated as uncontrolled   . Diverticulosis   . History of GI diverticular bleed 2007  . HTN (hypertension)   . AAA (abdominal aortic aneurysm)     CT 2006 3.2 cm  . Bladder cancer     Transitional cell carcinoma excised 2006  . Renal malignant tumor     transitional cell carcinoma lasered 2006  . Hyperlipidemia   . Hydrocele, left   . Inguinal hernia   . Coronary artery disease 05/05/2011    Echo EF 50%- 55% mild inferiof wall hypokinesis,mild dilatation left artium,mild mitral annular calcif. mild MR, mild TR, and mild calcif. /sclerosis trileaflet aortic valve w/ trace aortic insufficiency  . Dysrhythmia     af  . Anemia   . GERD (gastroesophageal reflux disease)   . Arthritis   . Myocardial  infarction 1981    inferior-wall MI   . Atrial fibrillation 07/04/2010    cardioversion - successful  normal sinus rhythm; May 2012 discontinue Coumadin  and increaseAspirin  . Peripheral vascular disease 11/13/2010    Abd aortic doppler mild abd aortic aneurysm 3.1 x 2.9cm  . Coronary artery disease 10/01/2009    Persantaine myview-EF 53% moderate perfusion d/t infaract/scar w/mild perinfaract ischemia seen Mid Anterior ,Mid Inferior, Apical Inferior , Basal and Mid inferolateral, Apical Lateral region  .  Hypopotassemia 06/22/2012  . Edema 06/22/2012  . Congestive heart failure, unspecified 06/13/2012  . Olecranon bursitis 02/03/2012  . Pneumonia, organism unspecified 01/20/2012  . Neoplasm of uncertain behavior of skin 10/19/2011  . Unspecified venous (peripheral) insufficiency 08/26/2011  . Unspecified constipation 07/29/2011  . Lumbago 07/09/2011  . Personal history of fall 06/25/2011  . Unspecified malignant neoplasm of scalp and skin of neck 06/11/2011  . Other acquired deformity of ankle and foot(736.79) 06/11/2011  . Other alteration of consciousness 06/11/2011  . Hyposmolality and/or hyponatremia 05/07/2011  . Major depressive disorder, single episode, unspecified 05/07/2011  . Abnormality of gait 04/27/2011  . Other B-complex deficiencies 04/09/2011  . Senile dementia, uncomplicated 93/57/0177  . Phlebitis and thrombophlebitis of other deep vessels of lower extremities 04/09/2011  . Hemorrhage of rectum and anus 04/09/2011  . Loss of weight 04/09/2011  . Anemia, unspecified 9/8/209  . Malignant neoplasm of bladder, part unspecified 04/08/2008  . Malignant neoplasm of kidney, except pelvis 04/08/2005   Past Surgical History  Procedure Laterality Date  . Coronary artery bypass graft  '94    LIMA-LAD, SVG-OM, PD, RCA  . Cataract extraction  2007  . Renal laser ablation of tumor  2006  . Coronary artery bypass graft  1994    LIMA to LAD, vein to the marginal, vein to the PDA, vein to the PLA   . Appendectomy    . Eye surgery    . Incision and drainage of wound  09/10/2011    Procedure: IRRIGATION AND DEBRIDEMENT WOUND;  Surgeon: Theodoro Kos, DO;  Location: [facility];  Service: Plastics;  Laterality: N/A;  irrigation and debridement scalp wound with removal of bone and placement of intregra or acell and vac   Social History:   reports that he has quit smoking. He does not have any smokeless tobacco history on file. He reports that he does not drink alcohol or  use illicit drugs.  Family History  Problem Relation Age of Onset  . Diabetes Mother   . Diabetes Daughter     Medications: Patient's Medications  New Prescriptions   No medications on file  Previous Medications   AMIODARONE (PACERONE) 200 MG TABLET    Take 200 mg by mouth daily. Take one tablet daily for heart.   ASPIRIN 81 MG TABLET    Take 81 mg by mouth daily.   ATORVASTATIN (LIPITOR) 10 MG TABLET    Take 10 mg by mouth every Monday, Wednesday, and Friday.    DOCUSATE SODIUM (COLACE) 100 MG CAPSULE    Take 100 mg by mouth 2 (two) times daily.   EYELID CLEANSERS (OCUSOFT LID SCRUB PLUS) PADS    Apply topically 2 (two) times daily. Use to eyes twice daily in morning and evening.   INSULIN ASPART (NOVOLOG) 100 UNIT/ML INJECTION    Inject 5-8 Units into the skin 3 (three) times daily before meals. 5units with breakfast and dinner, 8 units with lunch for CBG>150   INSULIN DETEMIR (LEVEMIR FLEXPEN)  100 UNIT/ML INJECTION    Inject 14 Units into the skin at bedtime.    LEVOTHYROXINE (SYNTHROID, LEVOTHROID) 25 MCG TABLET    Take 25 mcg by mouth daily before breakfast.   NUTRITIONAL SUPPLEMENTS (BOOST DIABETIC) LIQD    Take by mouth. Drink 1 can daily.   OMEPRAZOLE (PRILOSEC) 20 MG CAPSULE    Take 20 mg by mouth daily.     VITAMIN B-12 (CYANOCOBALAMIN) 1000 MCG TABLET    Take 1,000 mcg by mouth daily.    Modified Medications   No medications on file  Discontinued Medications   No medications on file     Physical Exam: Physical Exam  Constitutional: He is oriented to person, place, and time. He appears well-developed and well-nourished.  HENT:  Head: Normocephalic and atraumatic.  Eyes: Conjunctivae and EOM are normal. Pupils are equal, round, and reactive to light.  Neck: Normal range of motion. Neck supple. No JVD present. No thyromegaly present.  Cardiovascular: Normal rate.  An irregular rhythm present.  Murmur heard.  Systolic murmur is present with a grade of 2/6    Pulmonary/Chest: Effort normal. He has no wheezes. He has rales (bibailar dry rales). He exhibits no tenderness.  Abdominal: Soft. Bowel sounds are normal. There is no tenderness.  Musculoskeletal: Normal range of motion. He exhibits no edema and no tenderness.  Lymphadenopathy:    He has no cervical adenopathy.  Neurological: He is alert and oriented to person, place, and time. He has normal reflexes. No cranial nerve deficit. He exhibits normal muscle tone. Coordination normal.  Skin: Skin is warm and dry. No rash noted. No erythema.  Small superficial right buttock pressure ulcer stage II healing. Rash: groin L>R, buttocks, skin tag R intergluteal cleft-healing   Psychiatric: He has a normal mood and affect. His behavior is normal. Judgment and thought content normal.    Filed Vitals:   02/19/14 1424  BP: 106/64  Pulse: 68  Temp: 97.6 F (36.4 C)  TempSrc: Tympanic  Resp: 16   Labs reviewed: Basic Metabolic Panel:  Recent Labs  05/10/13 08/15/13 09/12/13 12/07/13  NA 135* 138  --  136*  K 4.2 4.3  --  4.4  BUN 55* 49*  --  62*  CREATININE 2.5* 2.5*  --  2.6*  TSH  --   --  5.15 3.06   Liver Function Tests:  Recent Labs  03/16/13 05/10/13 08/15/13  AST 17 18 15   ALT 12 15 13   ALKPHOS 84 104 87   CBC:  Recent Labs  05/10/13 08/15/13 12/07/13  WBC 6.0 8.9 6.7  HGB 10.7* 10.0* 10.7*  HCT 32* 30* 31*  PLT 194 283 198   Lipid Panel:  Recent Labs  03/14/13  CHOL 196  HDL 37  LDLCALC 128  TRIG 157    Past Procedures:  08/09/13 CXR minimal cardiomegaly appears new without pulmonary vascular congestion or pleural effusion, mild elevation left hemidiaphragm unchanged, no inflammatory consolidate or suspicious nodule, mild atelectasis right lung base and left perihilar region.   Assessment/Plan Unspecified hypothyroidism Levothyroxine 103mcg. 12/07/13 TSH 3.062     Unspecified constipation Stable, takes Colace 100mg  bid    GERD (gastroesophageal reflux  disease) Stable. Continue Omeprazole 20mg  bid.     DIABETES MELLITUS, TYPE II 12/07/13 Hgb A1c 6.5. Continue Levemir 14 u qd, Novolog 8 u with lunch, 5 u with breakfast and dinner for CBG>150    Anemia 12/07/13 Hgb 10.7    Chronic renal disease, stage III Bun/creat 55/2.48  05/10/13  Bun/creat  59/2.49 08/15/13.  Bun/creat 62/2.6 12/07/13       Family/ Staff Communication: observe the patient  Goals of Care: SNF  Labs/tests ordered: none

## 2014-02-19 NOTE — Assessment & Plan Note (Signed)
Stable. Continue Omeprazole 20mg  bid.

## 2014-02-19 NOTE — Assessment & Plan Note (Signed)
12/07/13 Hgb A1c 6.5. Continue Levemir 14 u qd, Novolog 8 u with lunch, 5 u with breakfast and dinner for CBG>150

## 2014-02-19 NOTE — Assessment & Plan Note (Signed)
Bun/creat 55/2.48 05/10/13  Bun/creat  59/2.49 08/15/13.  Bun/creat 62/2.6 12/07/13

## 2014-02-19 NOTE — Assessment & Plan Note (Signed)
Stable, takes Colace 100mg bid.   

## 2014-03-15 ENCOUNTER — Non-Acute Institutional Stay (SKILLED_NURSING_FACILITY): Payer: Medicare Other | Admitting: Internal Medicine

## 2014-03-15 ENCOUNTER — Encounter: Payer: Self-pay | Admitting: Internal Medicine

## 2014-03-15 DIAGNOSIS — I10 Essential (primary) hypertension: Secondary | ICD-10-CM

## 2014-03-15 DIAGNOSIS — I8311 Varicose veins of right lower extremity with inflammation: Secondary | ICD-10-CM

## 2014-03-15 DIAGNOSIS — I8312 Varicose veins of left lower extremity with inflammation: Secondary | ICD-10-CM

## 2014-03-15 DIAGNOSIS — I872 Venous insufficiency (chronic) (peripheral): Secondary | ICD-10-CM | POA: Insufficient documentation

## 2014-03-15 HISTORY — DX: Venous insufficiency (chronic) (peripheral): I87.2

## 2014-03-15 MED ORDER — TRIAMCINOLONE ACETONIDE 0.025 % EX OINT: TOPICAL_OINTMENT | CUTANEOUS | Status: DC

## 2014-03-15 NOTE — Progress Notes (Signed)
Patient ID: [name] [name], male   DOB: 07-Feb-1921, 80 y.o.   MRN: [id number]    [facility] Room Number: 13  Place of Service: SNF (31) OFFICE   Allergies  Allergen Reactions  . Celebrex [Celecoxib]     Chief Complaint  Patient presents with  . Acute Visit    left lower extremity lesion    HPI:  Venous stasis dermatitis of both lower extremities; causing increased erythema and prickly sensation on the skin. Not painful or feverish.  Essential hypertension: controlled    Medications: Patient's Medications  New Prescriptions   No medications on file  Previous Medications   AMIODARONE (PACERONE) 200 MG TABLET    Take 200 mg by mouth daily. Take one tablet daily for heart.   ASPIRIN 81 MG TABLET    Take 81 mg by mouth daily.   ATORVASTATIN (LIPITOR) 10 MG TABLET    Take 10 mg by mouth every Monday, Wednesday, and Friday.    DOCUSATE SODIUM (COLACE) 100 MG CAPSULE    Take 100 mg by mouth 2 (two) times daily.   EYELID CLEANSERS (OCUSOFT LID SCRUB PLUS) PADS    Apply topically 2 (two) times daily. Use to eyes twice daily in morning and evening.   INSULIN ASPART (NOVOLOG) 100 UNIT/ML INJECTION    Inject 5-8 Units into the skin 3 (three) times daily before meals. 5units with breakfast and dinner, 8 units with lunch for CBG>150   INSULIN DETEMIR (LEVEMIR FLEXPEN) 100 UNIT/ML INJECTION    Inject 14 Units into the skin at bedtime.    LEVOTHYROXINE (SYNTHROID, LEVOTHROID) 25 MCG TABLET    Take 25 mcg by mouth daily before breakfast.   NUTRITIONAL SUPPLEMENTS (BOOST DIABETIC) LIQD    Take by mouth. Drink 1 can daily.   OMEPRAZOLE (PRILOSEC) 20 MG CAPSULE    Take 20 mg by mouth daily.     VITAMIN B-12 (CYANOCOBALAMIN) 1000 MCG TABLET    Take 1,000 mcg by mouth daily.    Modified Medications   No medications on file  Discontinued Medications   No medications on file     Review of Systems  Constitutional: Negative for fever, chills, diaphoresis and  fatigue.  HENT: Positive for hearing loss (mild). Negative for congestion, ear pain, rhinorrhea, sinus pressure, trouble swallowing and voice change.   Eyes: Negative for pain, redness, itching and visual disturbance.  Respiratory: Negative for cough, choking, chest tightness, shortness of breath and wheezing.   Cardiovascular: Positive for leg swelling (1-2+ bipedal). Negative for chest pain and palpitations.  Gastrointestinal: Negative for abdominal pain, constipation and abdominal distention.  Endocrine: Negative for cold intolerance, heat intolerance, polydipsia, polyphagia and polyuria.       Has been treated for type II diabetes  Genitourinary: Positive for frequency (drippling ). Negative for urgency and flank pain.  Musculoskeletal: Positive for arthralgias, back pain and gait problem (walker and w/c). Negative for neck pain and neck stiffness.  Skin: Positive for rash (BLE itching and rash resolved on Benadryl and hydrocortisone topical). Negative for wound.       Venous stasis dermatitis of lower legs, R>L  Allergic/Immunologic: Negative.   Neurological: Negative for tremors, seizures, facial asymmetry, speech difficulty, weakness, numbness and headaches.  Hematological: Negative.   Psychiatric/Behavioral: Positive for confusion (related to dementia). Negative for hallucinations, behavioral problems, sleep disturbance, dysphoric mood, decreased concentration and agitation. The patient is not nervous/anxious.     Filed Vitals:   03/15/14 1120  BP: 104/68  Pulse: 55  Temp: 98 F (36.7 C)  Resp: 14  Height: 5\' 9"  (1.753 m)  Weight: 147 lb 14.4 oz (67.087 kg)   Body mass index is 21.83 kg/(m^2).  Physical Exam  Constitutional: He is oriented to person, place, and time. He appears well-developed and well-nourished.  HENT:  Head: Normocephalic and atraumatic.  Eyes: Conjunctivae and EOM are normal. Pupils are equal, round, and reactive to light.  Neck: Normal range of motion.  Neck supple. No JVD present. No thyromegaly present.  Cardiovascular: Normal rate.  An irregular rhythm present.  Murmur heard.  Systolic murmur is present with a grade of 2/6  Diminished DP and PT bilaterally, but has bounding Popliteal pulse  Pulmonary/Chest: Effort normal. He has no wheezes. He has rales (bibailar dry rales). He exhibits no tenderness.  Abdominal: Soft. Bowel sounds are normal. There is no tenderness.  Musculoskeletal: Normal range of motion. He exhibits no edema and no tenderness.  Lymphadenopathy:    He has no cervical adenopathy.  Neurological: He is alert and oriented to person, place, and time. He has normal reflexes. No cranial nerve deficit. He exhibits normal muscle tone. Coordination normal.  Skin: Skin is warm and dry. No rash noted. No erythema.  Venous stasis dermatitis of the LLE with scaling and prickly feeling. Increased erythema of the lower legs bilaterally.  Psychiatric: He has a normal mood and affect. His behavior is normal. Judgment and thought content normal.     Labs reviewed: No visits with results within 3 Month(s) from this visit. Latest known visit with results is:  Lab on 12/11/2013  Component Date Value Ref Range Status  . Hemoglobin 12/07/2013 10.7* 13.5 - 17.5 g/dL Final  . HCT 12/07/2013 31* 41 - 53 % Final  . Platelets 12/07/2013 198  150 - 399 K/L Final  . WBC 12/07/2013 6.7   Final  . Glucose 12/07/2013 142   Final  . BUN 12/07/2013 62* 4 - 21 mg/dL Final  . Creatinine 12/07/2013 2.6* 0.6 - 1.3 mg/dL Final  . Potassium 12/07/2013 4.4  3.4 - 5.3 mmol/L Final  . Sodium 12/07/2013 136* 137 - 147 mmol/L Final  . Hemoglobin A1C 12/07/2013 6.5* 4.0 - 6.0 % Final  . TSH 12/07/2013 3.06  0.41 - 5.90 uIU/mL Final     Assessment/Plan 1. Venous stasis dermatitis of both lower extremities - triamcinolone (KENALOG) 0.025 % ointment; Apply to rough skin on legs daily for 7 days, then stop  Dispense: 30 g; Refill: 0  2. Essential  hypertension controlled

## 2014-03-21 ENCOUNTER — Encounter: Payer: Self-pay | Admitting: Nurse Practitioner

## 2014-03-21 ENCOUNTER — Non-Acute Institutional Stay (SKILLED_NURSING_FACILITY): Payer: Medicare Other | Admitting: Nurse Practitioner

## 2014-03-21 DIAGNOSIS — N058 Unspecified nephritic syndrome with other morphologic changes: Secondary | ICD-10-CM

## 2014-03-21 DIAGNOSIS — S61402A Unspecified open wound of left hand, initial encounter: Secondary | ICD-10-CM

## 2014-03-21 DIAGNOSIS — E1129 Type 2 diabetes mellitus with other diabetic kidney complication: Secondary | ICD-10-CM

## 2014-03-21 DIAGNOSIS — S61412A Laceration without foreign body of left hand, initial encounter: Secondary | ICD-10-CM | POA: Insufficient documentation

## 2014-03-21 DIAGNOSIS — K219 Gastro-esophageal reflux disease without esophagitis: Secondary | ICD-10-CM

## 2014-03-21 DIAGNOSIS — N183 Chronic kidney disease, stage 3 unspecified: Secondary | ICD-10-CM

## 2014-03-21 DIAGNOSIS — D631 Anemia in chronic kidney disease: Secondary | ICD-10-CM

## 2014-03-21 DIAGNOSIS — N189 Chronic kidney disease, unspecified: Secondary | ICD-10-CM

## 2014-03-21 DIAGNOSIS — E039 Hypothyroidism, unspecified: Secondary | ICD-10-CM

## 2014-03-21 NOTE — Assessment & Plan Note (Signed)
TSH 5.151 09/12/13, started Levothyroxine 88mcg 09/13/13 12/07/13 TSH 3.062 03/21/14 update TSH

## 2014-03-21 NOTE — Assessment & Plan Note (Addendum)
12/07/13 Hgb 10.7. Update CBC

## 2014-03-21 NOTE — Assessment & Plan Note (Signed)
Stable. Continue Omeprazole 20mg  bid.

## 2014-03-21 NOTE — Assessment & Plan Note (Signed)
12/07/13 Hgb A1c 6.5. Continue Levemir 14 u qd, Novolog 8 u with lunch, 5 u with breakfast and dinner for CBG>150

## 2014-03-21 NOTE — Assessment & Plan Note (Signed)
Sustained 03/20/14-no s/s of infection. Uncertain of the mechanism of its occurrence.

## 2014-03-21 NOTE — Assessment & Plan Note (Addendum)
Bun/creat 55/2.48 05/10/13  Bun/creat  59/2.49 08/15/13.  Bun/creat 62/2.6 12/07/13 03/20/14 update BMP

## 2014-03-21 NOTE — Progress Notes (Signed)
Patient ID: [name] [name], male   DOB: 09-20-1920, 80 y.o.   MRN: [id number]   Code Status: DNR  Allergies  Allergen Reactions  . Celebrex [Celecoxib]     Chief Complaint  Patient presents with  . Medical Management of Chronic Issues  . Acute Visit    skin tear left hand    HPI: Patient is a 80 y.o. male seen in the SNF at [facility] today for evaluation of left hand skin tear and chronic medical conditions.  Problem List Items Addressed This Visit   Type 2 diabetes mellitus with renal manifestations, controlled     12/07/13 Hgb A1c 6.5. Continue Levemir 14 u qd, Novolog 8 u with lunch, 5 u with breakfast and dinner for CBG>150      Skin tear of left hand without complication     Sustained 03/20/14-no s/s of infection. Uncertain of the mechanism of its occurrence.     Hypothyroidism - Primary     TSH 5.151 09/12/13, started Levothyroxine 66mcg 09/13/13 12/07/13 TSH 3.062 03/21/14 update TSH     GERD (gastroesophageal reflux disease)     Stable. Continue Omeprazole 20mg  bid.       Chronic renal disease, stage III (Chronic)     Bun/creat 55/2.48 05/10/13  Bun/creat  59/2.49 08/15/13.  Bun/creat 62/2.6 12/07/13 03/20/14 update BMP       Anemia     12/07/13 Hgb 10.7. Update CBC        Review of Systems:  Review of Systems  Constitutional: Negative for fever, chills, weight loss, malaise/fatigue and diaphoresis.  HENT: Positive for hearing loss. Negative for congestion, ear pain and sore throat.   Eyes: Negative for pain, discharge and redness.  Respiratory: Negative for cough, sputum production, shortness of breath and wheezing.   Cardiovascular: Positive for PND. Negative for chest pain, palpitations, orthopnea, claudication and leg swelling.       Resolved bradycardia-HR 60s  Gastrointestinal: Positive for heartburn. Negative for nausea, vomiting, abdominal pain, diarrhea and constipation.       Occasionally.   Genitourinary: Positive for frequency. Negative  for dysuria, urgency, hematuria and flank pain.  Musculoskeletal: Positive for back pain and joint pain. Negative for falls, myalgias and neck pain.  Skin: Positive for rash. Negative for itching.       Small superficial right buttock pressure ulcer stage II healing. L hand skin tear. R lower leg scaling area.  Neurological: Negative for dizziness, tingling, tremors, sensory change, speech change, focal weakness, seizures, loss of consciousness, weakness and headaches.  Endo/Heme/Allergies: Negative for environmental allergies and polydipsia. Does not bruise/bleed easily.  Psychiatric/Behavioral: Positive for memory loss. Negative for depression and hallucinations. The patient is not nervous/anxious and does not have insomnia.        Increased confusion     Past Medical History  Diagnosis Date  . CVA (cerebral infarction) 1981    MI  . Type II or unspecified type diabetes mellitus without mention of complication, not stated as uncontrolled   . Diverticulosis   . History of GI diverticular bleed 2007  . HTN (hypertension)   . AAA (abdominal aortic aneurysm)     CT 2006 3.2 cm  . Bladder cancer     Transitional cell carcinoma excised 2006  . Renal malignant tumor     transitional cell carcinoma lasered 2006  . Hyperlipidemia   . Hydrocele, left   . Inguinal hernia   . Coronary artery disease 05/05/2011    Echo EF 50%-  55% mild inferiof wall hypokinesis,mild dilatation left artium,mild mitral annular calcif. mild MR, mild TR, and mild calcif. /sclerosis trileaflet aortic valve w/ trace aortic insufficiency  . Dysrhythmia     af  . Anemia   . GERD (gastroesophageal reflux disease)   . Arthritis   . Myocardial infarction 1981    inferior-wall MI   . Atrial fibrillation 07/04/2010    cardioversion - successful  normal sinus rhythm; May 2012 discontinue Coumadin  and increaseAspirin  . Peripheral vascular disease 11/13/2010    Abd aortic doppler mild abd aortic aneurysm 3.1 x 2.9cm    . Coronary artery disease 10/01/2009    Persantaine myview-EF 53% moderate perfusion d/t infaract/scar w/mild perinfaract ischemia seen Mid Anterior ,Mid Inferior, Apical Inferior , Basal and Mid inferolateral, Apical Lateral region  . Hypopotassemia 06/22/2012  . Edema 06/22/2012  . Congestive heart failure, unspecified 06/13/2012  . Olecranon bursitis 02/03/2012  . Pneumonia, organism unspecified 01/20/2012  . Neoplasm of uncertain behavior of skin 10/19/2011  . Unspecified venous (peripheral) insufficiency 08/26/2011  . Unspecified constipation 07/29/2011  . Lumbago 07/09/2011  . Personal history of fall 06/25/2011  . Unspecified malignant neoplasm of scalp and skin of neck 06/11/2011  . Other acquired deformity of ankle and foot(736.79) 06/11/2011  . Other alteration of consciousness 06/11/2011  . Hyposmolality and/or hyponatremia 05/07/2011  . Major depressive disorder, single episode, unspecified 05/07/2011  . Abnormality of gait 04/27/2011  . Other B-complex deficiencies 04/09/2011  . Senile dementia, uncomplicated 22/29/7989  . Phlebitis and thrombophlebitis of other deep vessels of lower extremities 04/09/2011  . Hemorrhage of rectum and anus 04/09/2011  . Loss of weight 04/09/2011  . Anemia, unspecified 9/8/209  . Malignant neoplasm of bladder, part unspecified 04/08/2008  . Malignant neoplasm of kidney, except pelvis 04/08/2005  . Venous stasis dermatitis 03/15/14   Past Surgical History  Procedure Laterality Date  . Coronary artery bypass graft  '94    LIMA-LAD, SVG-OM, PD, RCA  . Cataract extraction  2007  . Renal laser ablation of tumor  2006  . Coronary artery bypass graft  1994    LIMA to LAD, vein to the marginal, vein to the PDA, vein to the PLA   . Appendectomy    . Eye surgery    . Incision and drainage of wound  09/10/2011    Procedure: IRRIGATION AND DEBRIDEMENT WOUND;  Surgeon: Theodoro Kos, DO;  Location: [place];  Service: Plastics;   Laterality: N/A;  irrigation and debridement scalp wound with removal of bone and placement of intregra or acell and vac   Social History:   reports that he has quit smoking. He does not have any smokeless tobacco history on file. He reports that he does not drink alcohol or use illicit drugs.  Family History  Problem Relation Age of Onset  . Diabetes Mother   . Diabetes Daughter     Medications: Patient's Medications  New Prescriptions   No medications on file  Previous Medications   AMIODARONE (PACERONE) 200 MG TABLET    Take 200 mg by mouth daily. Take one tablet daily for heart.   ASPIRIN 81 MG TABLET    Take 81 mg by mouth daily.   ATORVASTATIN (LIPITOR) 10 MG TABLET    Take 10 mg by mouth every Monday, Wednesday, and Friday.    DOCUSATE SODIUM (COLACE) 100 MG CAPSULE    Take 100 mg by mouth 2 (two) times daily.   EYELID CLEANSERS (OCUSOFT LID SCRUB PLUS)  PADS    Apply topically 2 (two) times daily. Use to eyes twice daily in morning and evening.   INSULIN ASPART (NOVOLOG) 100 UNIT/ML INJECTION    Inject 5-8 Units into the skin 3 (three) times daily before meals. 5units with breakfast and dinner, 8 units with lunch for CBG>150   INSULIN DETEMIR (LEVEMIR FLEXPEN) 100 UNIT/ML INJECTION    Inject 14 Units into the skin at bedtime.    LEVOTHYROXINE (SYNTHROID, LEVOTHROID) 25 MCG TABLET    Take 25 mcg by mouth daily before breakfast.   NUTRITIONAL SUPPLEMENTS (BOOST DIABETIC) LIQD    Take by mouth. Drink 1 can daily.   OMEPRAZOLE (PRILOSEC) 20 MG CAPSULE    Take 20 mg by mouth daily.     TRIAMCINOLONE (KENALOG) 0.025 % OINTMENT    Apply to rough skin on legs daily for 7 days, then stop   VITAMIN B-12 (CYANOCOBALAMIN) 1000 MCG TABLET    Take 1,000 mcg by mouth daily.    Modified Medications   No medications on file  Discontinued Medications   No medications on file     Physical Exam: Physical Exam  Constitutional: He is oriented to person, place, and time. He appears  well-developed and well-nourished.  HENT:  Head: Normocephalic and atraumatic.  Eyes: Conjunctivae and EOM are normal. Pupils are equal, round, and reactive to light.  Neck: Normal range of motion. Neck supple. No JVD present. No thyromegaly present.  Cardiovascular: Normal rate.  An irregular rhythm present.  Murmur heard.  Systolic murmur is present with a grade of 2/6  Pulmonary/Chest: Effort normal. He has no wheezes. He has rales (bibailar dry rales). He exhibits no tenderness.  Abdominal: Soft. Bowel sounds are normal. There is no tenderness.  Musculoskeletal: Normal range of motion. He exhibits no edema and no tenderness.  Lymphadenopathy:    He has no cervical adenopathy.  Neurological: He is alert and oriented to person, place, and time. He has normal reflexes. No cranial nerve deficit. He exhibits normal muscle tone. Coordination normal.  Skin: Skin is warm and dry. No rash noted. No erythema.  Small superficial right buttock pressure ulcer stage II healing. L hand skin tear. R lower leg scaling area.   Psychiatric: He has a normal mood and affect. His behavior is normal. Judgment and thought content normal.    Filed Vitals:   03/21/14 1148  BP: 104/68  Pulse: 55  Temp: 98 F (36.7 C)  TempSrc: Tympanic  Resp: 14   Labs reviewed: Basic Metabolic Panel:  Recent Labs  08/15/13 09/12/13 12/07/13 03/22/14  NA 138  --  136* 138  K 4.3  --  4.4 4.4  BUN 49*  --  62* 72*  CREATININE 2.5*  --  2.6* 2.7*  TSH  --  5.15 3.06 4.43   Liver Function Tests:  Recent Labs  05/10/13 08/15/13  AST 18 15  ALT 15 13  ALKPHOS 104 87   CBC:  Recent Labs  08/15/13 12/07/13 03/22/14  WBC 8.9 6.7 7.1  HGB 10.0* 10.7* 9.9*  HCT 30* 31* 30*  PLT 283 198 178   Lipid Panel: No results found for this basename: CHOL, HDL, LDLCALC, TRIG, CHOLHDL, LDLDIRECT,  in the last 8760 hours  Past Procedures:  08/09/13 CXR minimal cardiomegaly appears new without pulmonary vascular  congestion or pleural effusion, mild elevation left hemidiaphragm unchanged, no inflammatory consolidate or suspicious nodule, mild atelectasis right lung base and left perihilar region.   Assessment/Plan Hypothyroidism TSH 5.151 09/12/13,  started Levothyroxine 89mcg 09/13/13 12/07/13 TSH 3.062 03/21/14 update TSH   GERD (gastroesophageal reflux disease) Stable. Continue Omeprazole 20mg  bid.     Type 2 diabetes mellitus with renal manifestations, controlled 12/07/13 Hgb A1c 6.5. Continue Levemir 14 u qd, Novolog 8 u with lunch, 5 u with breakfast and dinner for CBG>150    Chronic renal disease, stage III Bun/creat 55/2.48 05/10/13  Bun/creat  59/2.49 08/15/13.  Bun/creat 62/2.6 12/07/13 03/20/14 update BMP     Anemia 12/07/13 Hgb 10.7. Update CBC   Skin tear of left hand without complication Sustained 78/58/85-OY s/s of infection. Uncertain of the mechanism of its occurrence.     Family/ Staff Communication: observe the patient  Goals of Care: SNF  Labs/tests ordered: CBC, BMP, TSH

## 2014-03-22 LAB — BASIC METABOLIC PANEL
BUN: 72 mg/dL — AB (ref 4–21)
CREATININE: 2.7 mg/dL — AB (ref 0.6–1.3)
GLUCOSE: 129 mg/dL
POTASSIUM: 4.4 mmol/L (ref 3.4–5.3)
Sodium: 138 mmol/L (ref 137–147)

## 2014-03-22 LAB — CBC AND DIFFERENTIAL
HCT: 30 % — AB (ref 41–53)
HEMOGLOBIN: 9.9 g/dL — AB (ref 13.5–17.5)
Platelets: 178 10*3/uL (ref 150–399)
WBC: 7.1 10*3/mL

## 2014-03-22 LAB — TSH: TSH: 4.43 u[IU]/mL (ref 0.41–5.90)

## 2014-03-26 ENCOUNTER — Other Ambulatory Visit: Payer: Self-pay | Admitting: Nurse Practitioner

## 2014-04-18 ENCOUNTER — Encounter: Payer: Self-pay | Admitting: Nurse Practitioner

## 2014-04-18 ENCOUNTER — Non-Acute Institutional Stay (SKILLED_NURSING_FACILITY): Payer: Medicare Other | Admitting: Nurse Practitioner

## 2014-04-18 DIAGNOSIS — N183 Chronic kidney disease, stage 3 unspecified: Secondary | ICD-10-CM

## 2014-04-18 DIAGNOSIS — I10 Essential (primary) hypertension: Secondary | ICD-10-CM

## 2014-04-18 DIAGNOSIS — K219 Gastro-esophageal reflux disease without esophagitis: Secondary | ICD-10-CM

## 2014-04-18 DIAGNOSIS — I471 Supraventricular tachycardia, unspecified: Secondary | ICD-10-CM

## 2014-04-18 DIAGNOSIS — N189 Chronic kidney disease, unspecified: Secondary | ICD-10-CM

## 2014-04-18 DIAGNOSIS — M159 Polyosteoarthritis, unspecified: Secondary | ICD-10-CM | POA: Insufficient documentation

## 2014-04-18 DIAGNOSIS — M150 Primary generalized (osteo)arthritis: Secondary | ICD-10-CM

## 2014-04-18 DIAGNOSIS — K5900 Constipation, unspecified: Secondary | ICD-10-CM

## 2014-04-18 DIAGNOSIS — D631 Anemia in chronic kidney disease: Secondary | ICD-10-CM

## 2014-04-18 DIAGNOSIS — E039 Hypothyroidism, unspecified: Secondary | ICD-10-CM

## 2014-04-18 DIAGNOSIS — E1122 Type 2 diabetes mellitus with diabetic chronic kidney disease: Secondary | ICD-10-CM

## 2014-04-18 NOTE — Assessment & Plan Note (Signed)
Stable.  Continue Omeprazole.

## 2014-04-18 NOTE — Progress Notes (Signed)
Patient ID: [name] [name], male   DOB: 27-Jul-1920, 80 y.o.   MRN: [id number]   Code Status: DNR  Allergies  Allergen Reactions  . Celebrex [Celecoxib]     Chief Complaint  Patient presents with  . Medical Management of Chronic Issues  . Acute Visit    lower back and legs aches/pain.     HPI: Patient is a 80 y.o. male seen in the SNF at [facility] today for evaluation of aches lower back/legs and chronic medical conditions.  Problem List Items Addressed This Visit    Type II diabetes mellitus with renal manifestations    12/07/13 Hgb A1c 6.5. Continue Levemir 14 u qd, Novolog 8 u with lunch, 5 u with breakfast and dinner for CBG>150       Paroxysmal supraventricular tachycardia    Rate controlled.       Osteoarthritis, multiple sites    11/18//15 c/o lower back R+L SIJs and legs, aches in nature, comes and goes. Denied dysuria, urinary frequency, or suprapubic pain. Will schedule Tylenol 500mg  bid and obtain UA C/S.  May consider X-ray lumbar spine and pelvis. Observe.     Relevant Medications      acetaminophen (TYLENOL) 500 MG tablet   Hypothyroidism    TSH 5.151 09/12/13, started Levothyroxine 29mcg 09/13/13 12/07/13 TSH 3.062 03/21/14 update TSH      GERD (gastroesophageal reflux disease)    Stable. Continue Omeprazole       Essential hypertension    Occasionally elevated SBP 140-150s--off antihypertensive agents.      Constipation - Primary    Stable, takes Colace 100mg  bid      Chronic renal disease, stage III (Chronic)    03/22/14 Bun/creat 72/2.69      Anemia    12/07/13 Hgb 10.7 03/22/14 Hgb 9.9        Review of Systems:  Review of Systems  Constitutional: Negative for fever, chills, weight loss, malaise/fatigue and diaphoresis.  HENT: Positive for hearing loss. Negative for congestion, ear pain and sore throat.   Eyes: Negative for pain, discharge and redness.  Respiratory: Negative for cough, sputum production, shortness of breath  and wheezing.   Cardiovascular: Positive for PND. Negative for chest pain, palpitations, orthopnea, claudication and leg swelling.       Resolved bradycardia-HR 60s  Gastrointestinal: Positive for heartburn. Negative for nausea, vomiting, abdominal pain, diarrhea and constipation.       Occasionally.   Genitourinary: Positive for frequency. Negative for dysuria, urgency, hematuria and flank pain.  Musculoskeletal: Positive for back pain and joint pain. Negative for myalgias, falls and neck pain.       C/o aches in lower back and legs.   Skin: Negative for itching and rash.       Small superficial right buttock pressure ulcer stage II healing. R lower leg scaling area.  Neurological: Negative for dizziness, tingling, tremors, sensory change, speech change, focal weakness, seizures, loss of consciousness, weakness and headaches.  Endo/Heme/Allergies: Negative for environmental allergies and polydipsia. Does not bruise/bleed easily.  Psychiatric/Behavioral: Positive for memory loss. Negative for depression and hallucinations. The patient is not nervous/anxious and does not have insomnia.        Increased confusion     Past Medical History  Diagnosis Date  . CVA (cerebral infarction) 1981    MI  . Type II or unspecified type diabetes mellitus without mention of complication, not stated as uncontrolled   . Diverticulosis   . History of GI diverticular bleed  2007  . HTN (hypertension)   . AAA (abdominal aortic aneurysm)     CT 2006 3.2 cm  . Bladder cancer     Transitional cell carcinoma excised 2006  . Renal malignant tumor     transitional cell carcinoma lasered 2006  . Hyperlipidemia   . Hydrocele, left   . Inguinal hernia   . Coronary artery disease 05/05/2011    Echo EF 50%- 55% mild inferiof wall hypokinesis,mild dilatation left artium,mild mitral annular calcif. mild MR, mild TR, and mild calcif. /sclerosis trileaflet aortic valve w/ trace aortic insufficiency  . Dysrhythmia      af  . Anemia   . GERD (gastroesophageal reflux disease)   . Arthritis   . Myocardial infarction 1981    inferior-wall MI   . Atrial fibrillation 07/04/2010    cardioversion - successful  normal sinus rhythm; May 2012 discontinue Coumadin  and increaseAspirin  . Peripheral vascular disease 11/13/2010    Abd aortic doppler mild abd aortic aneurysm 3.1 x 2.9cm  . Coronary artery disease 10/01/2009    Persantaine myview-EF 53% moderate perfusion d/t infaract/scar w/mild perinfaract ischemia seen Mid Anterior ,Mid Inferior, Apical Inferior , Basal and Mid inferolateral, Apical Lateral region  . Hypopotassemia 06/22/2012  . Edema 06/22/2012  . Congestive heart failure, unspecified 06/13/2012  . Olecranon bursitis 02/03/2012  . Pneumonia, organism unspecified 01/20/2012  . Neoplasm of uncertain behavior of skin 10/19/2011  . Unspecified venous (peripheral) insufficiency 08/26/2011  . Unspecified constipation 07/29/2011  . Lumbago 07/09/2011  . Personal history of fall 06/25/2011  . Unspecified malignant neoplasm of scalp and skin of neck 06/11/2011  . Other acquired deformity of ankle and foot(736.79) 06/11/2011  . Other alteration of consciousness 06/11/2011  . Hyposmolality and/or hyponatremia 05/07/2011  . Major depressive disorder, single episode, unspecified 05/07/2011  . Abnormality of gait 04/27/2011  . Other B-complex deficiencies 04/09/2011  . Senile dementia, uncomplicated 35/36/1443  . Phlebitis and thrombophlebitis of other deep vessels of lower extremities 04/09/2011  . Hemorrhage of rectum and anus 04/09/2011  . Loss of weight 04/09/2011  . Anemia, unspecified 9/8/209  . Malignant neoplasm of bladder, part unspecified 04/08/2008  . Malignant neoplasm of kidney, except pelvis 04/08/2005  . Venous stasis dermatitis 03/15/14   Past Surgical History  Procedure Laterality Date  . Coronary artery bypass graft  '94    LIMA-LAD, SVG-OM, PD, RCA  . Cataract extraction  2007  .  Renal laser ablation of tumor  2006  . Coronary artery bypass graft  1994    LIMA to LAD, vein to the marginal, vein to the PDA, vein to the PLA   . Appendectomy    . Eye surgery    . Incision and drainage of wound  09/10/2011    Procedure: IRRIGATION AND DEBRIDEMENT WOUND;  Surgeon: Theodoro Kos, DO;  Location: [place];  Service: Plastics;  Laterality: N/A;  irrigation and debridement scalp wound with removal of bone and placement of intregra or acell and vac   Social History:   reports that he has quit smoking. He does not have any smokeless tobacco history on file. He reports that he does not drink alcohol or use illicit drugs.  Family History  Problem Relation Age of Onset  . Diabetes Mother   . Diabetes Daughter     Medications: Patient's Medications  New Prescriptions   No medications on file  Previous Medications   ACETAMINOPHEN (TYLENOL) 500 MG TABLET    Take 500 mg by  mouth 2 (two) times daily.   AMIODARONE (PACERONE) 200 MG TABLET    Take 200 mg by mouth daily. Take one tablet daily for heart.   ASPIRIN 81 MG TABLET    Take 81 mg by mouth daily.   ATORVASTATIN (LIPITOR) 10 MG TABLET    Take 10 mg by mouth every Monday, Wednesday, and Friday.    DOCUSATE SODIUM (COLACE) 100 MG CAPSULE    Take 100 mg by mouth 2 (two) times daily.   EYELID CLEANSERS (OCUSOFT LID SCRUB PLUS) PADS    Apply topically 2 (two) times daily. Use to eyes twice daily in morning and evening.   INSULIN ASPART (NOVOLOG) 100 UNIT/ML INJECTION    Inject 5-8 Units into the skin 3 (three) times daily before meals. 5units with breakfast and dinner, 8 units with lunch for CBG>150   INSULIN DETEMIR (LEVEMIR FLEXPEN) 100 UNIT/ML INJECTION    Inject 14 Units into the skin at bedtime.    LEVOTHYROXINE (SYNTHROID, LEVOTHROID) 25 MCG TABLET    Take 25 mcg by mouth daily before breakfast.   NUTRITIONAL SUPPLEMENTS (BOOST DIABETIC) LIQD    Take by mouth. Drink 1 can daily.   OMEPRAZOLE (PRILOSEC) 20 MG  CAPSULE    Take 20 mg by mouth daily.     TRIAMCINOLONE (KENALOG) 0.025 % OINTMENT    Apply to rough skin on legs daily for 7 days, then stop   VITAMIN B-12 (CYANOCOBALAMIN) 1000 MCG TABLET    Take 1,000 mcg by mouth daily.    Modified Medications   No medications on file  Discontinued Medications   No medications on file     Physical Exam: Physical Exam  Constitutional: He is oriented to person, place, and time. He appears well-developed and well-nourished.  HENT:  Head: Normocephalic and atraumatic.  Eyes: Conjunctivae and EOM are normal. Pupils are equal, round, and reactive to light.  Neck: Normal range of motion. Neck supple. No JVD present. No thyromegaly present.  Cardiovascular: Normal rate.  An irregular rhythm present.  Murmur heard.  Systolic murmur is present with a grade of 2/6  Pulmonary/Chest: Effort normal. He has no wheezes. He has rales (bibailar dry rales). He exhibits no tenderness.  Abdominal: Soft. Bowel sounds are normal. There is no tenderness.  Musculoskeletal: Normal range of motion. He exhibits tenderness. He exhibits no edema.  Aches in lower back and legs. No decreased ROM or deformity. Ok weight bearing.   Lymphadenopathy:    He has no cervical adenopathy.  Neurological: He is alert and oriented to person, place, and time. He has normal reflexes. No cranial nerve deficit. He exhibits normal muscle tone. Coordination normal.  Skin: Skin is warm and dry. No rash noted. No erythema.  Small superficial right buttock pressure ulcer stage II healing. R lower leg scaling area.    Psychiatric: He has a normal mood and affect. His behavior is normal. Judgment and thought content normal.    Filed Vitals:   04/18/14 1355  BP: 150/80  Pulse: 67  Temp: 97.3 F (36.3 C)  TempSrc: Tympanic  Resp: 18   Labs reviewed: Basic Metabolic Panel:  Recent Labs  08/15/13 09/12/13 12/07/13 03/22/14  NA 138  --  136* 138  K 4.3  --  4.4 4.4  BUN 49*  --  62* 72*    CREATININE 2.5*  --  2.6* 2.7*  TSH  --  5.15 3.06 4.43   Liver Function Tests:  Recent Labs  05/10/13 08/15/13  AST 18  15  ALT 15 13  ALKPHOS 104 87   CBC:  Recent Labs  08/15/13 12/07/13 03/22/14  WBC 8.9 6.7 7.1  HGB 10.0* 10.7* 9.9*  HCT 30* 31* 30*  PLT 283 198 178   Lipid Panel: No results for input(s): CHOL, HDL, LDLCALC, TRIG, CHOLHDL, LDLDIRECT in the last 8760 hours.  Past Procedures:  08/09/13 CXR minimal cardiomegaly appears new without pulmonary vascular congestion or pleural effusion, mild elevation left hemidiaphragm unchanged, no inflammatory consolidate or suspicious nodule, mild atelectasis right lung base and left perihilar region.   Assessment/Plan Constipation Stable, takes Colace 100mg  bid    Type II diabetes mellitus with renal manifestations 12/07/13 Hgb A1c 6.5. Continue Levemir 14 u qd, Novolog 8 u with lunch, 5 u with breakfast and dinner for CBG>150     Paroxysmal supraventricular tachycardia Rate controlled.     Hypothyroidism TSH 5.151 09/12/13, started Levothyroxine 57mcg 09/13/13 12/07/13 TSH 3.062 03/21/14 update TSH    GERD (gastroesophageal reflux disease) Stable. Continue Omeprazole     Essential hypertension Occasionally elevated SBP 140-150s--off antihypertensive agents.    Anemia 12/07/13 Hgb 10.7 03/22/14 Hgb 9.9   Chronic renal disease, stage III 03/22/14 Bun/creat 72/2.69    Osteoarthritis, multiple sites 11/18//15 c/o lower back R+L SIJs and legs, aches in nature, comes and goes. Denied dysuria, urinary frequency, or suprapubic pain. Will schedule Tylenol 500mg  bid and obtain UA C/S.  May consider X-ray lumbar spine and pelvis. Observe.     Family/ Staff Communication: observe the patient  Goals of Care: SNF  Labs/tests ordered: UA C/S

## 2014-04-18 NOTE — Assessment & Plan Note (Signed)
Stable, takes Colace 100mg bid.   

## 2014-04-18 NOTE — Assessment & Plan Note (Signed)
12/07/13 Hgb 10.7 03/22/14 Hgb 9.9   

## 2014-04-18 NOTE — Assessment & Plan Note (Signed)
Rate controlled 

## 2014-04-18 NOTE — Assessment & Plan Note (Signed)
Occasionally elevated SBP 140-150s--off antihypertensive agents.

## 2014-04-18 NOTE — Assessment & Plan Note (Signed)
03/22/14 Bun/creat 72/2.69

## 2014-04-18 NOTE — Assessment & Plan Note (Signed)
TSH 5.151 09/12/13, started Levothyroxine 66mcg 09/13/13 12/07/13 TSH 3.062 03/21/14 update TSH

## 2014-04-18 NOTE — Assessment & Plan Note (Signed)
12/07/13 Hgb A1c 6.5. Continue Levemir 14 u qd, Novolog 8 u with lunch, 5 u with breakfast and dinner for CBG>150

## 2014-04-18 NOTE — Assessment & Plan Note (Signed)
11/18//15 c/o lower back R+L SIJs and legs, aches in nature, comes and goes. Denied dysuria, urinary frequency, or suprapubic pain. Will schedule Tylenol 500mg bid and obtain UA C/S.  May consider X-ray lumbar spine and pelvis. Observe.   

## 2014-05-09 ENCOUNTER — Non-Acute Institutional Stay (SKILLED_NURSING_FACILITY): Payer: Medicare Other | Admitting: Nurse Practitioner

## 2014-05-09 ENCOUNTER — Encounter: Payer: Self-pay | Admitting: Nurse Practitioner

## 2014-05-09 DIAGNOSIS — K5900 Constipation, unspecified: Secondary | ICD-10-CM

## 2014-05-09 DIAGNOSIS — E1122 Type 2 diabetes mellitus with diabetic chronic kidney disease: Secondary | ICD-10-CM

## 2014-05-09 DIAGNOSIS — M150 Primary generalized (osteo)arthritis: Secondary | ICD-10-CM

## 2014-05-09 DIAGNOSIS — I471 Supraventricular tachycardia: Secondary | ICD-10-CM

## 2014-05-09 DIAGNOSIS — N183 Chronic kidney disease, stage 3 unspecified: Secondary | ICD-10-CM

## 2014-05-09 DIAGNOSIS — M159 Polyosteoarthritis, unspecified: Secondary | ICD-10-CM

## 2014-05-09 DIAGNOSIS — N189 Chronic kidney disease, unspecified: Secondary | ICD-10-CM

## 2014-05-09 DIAGNOSIS — E538 Deficiency of other specified B group vitamins: Secondary | ICD-10-CM

## 2014-05-09 DIAGNOSIS — R413 Other amnesia: Secondary | ICD-10-CM

## 2014-05-09 DIAGNOSIS — K219 Gastro-esophageal reflux disease without esophagitis: Secondary | ICD-10-CM

## 2014-05-09 DIAGNOSIS — E039 Hypothyroidism, unspecified: Secondary | ICD-10-CM

## 2014-05-09 NOTE — Assessment & Plan Note (Signed)
Bun/creat 55/2.48 05/10/13  Bun/creat  59/2.49 08/15/13.  Bun/creat 62/2.6 12/07/13 03/22/14 Bun/creat 72/2.69    

## 2014-05-09 NOTE — Assessment & Plan Note (Signed)
Takes Vit B12 1000mcg po daily. 

## 2014-05-09 NOTE — Assessment & Plan Note (Signed)
Stable, takes Colace 100mg bid.   

## 2014-05-09 NOTE — Assessment & Plan Note (Signed)
Family very aware of changes. SNF for care needs.   

## 2014-05-09 NOTE — Assessment & Plan Note (Signed)
Stable.  Continue Omeprazole.

## 2014-05-09 NOTE — Assessment & Plan Note (Signed)
12/07/13 Hgb A1c 6.5. Continue Levemir 14 u qd, Novolog 8 u with lunch, 5 u with breakfast and dinner for CBG>100   

## 2014-05-09 NOTE — Assessment & Plan Note (Signed)
TSH 5.151 09/12/13, started Levothyroxine 25mcg 09/13/13 12/07/13 TSH 3.062 03/22/14 TSH 4.433   

## 2014-05-09 NOTE — Progress Notes (Signed)
Patient ID: [name] [name], male   DOB: [birth year], 80 y.o.   MRN: [id number]   Code Status: DNR  Allergies  Allergen Reactions  . Celebrex [Celecoxib]     Chief Complaint  Patient presents with  . Medical Management of Chronic Issues    HPI: Patient is a 80 y.o. male seen in the SNF at [facility] today for evaluation of chronic medical conditions.  Problem List Items Addressed This Visit    Vitamin B 12 deficiency    Takes Vit B12 1056mcg po daily     Type II diabetes mellitus with renal manifestations    12/07/13 Hgb A1c 6.5. Continue Levemir 14 u qd, Novolog 8 u with lunch, 5 u with breakfast and dinner for CBG>100        Paroxysmal supraventricular tachycardia - Primary    Rate controlled. Takes Amiodarone 200mg  daily.      Osteoarthritis, multiple sites    11/18//15 c/o lower back R+L SIJs and legs, aches in nature, comes and goes. Denied dysuria, urinary frequency, or suprapubic pain. Will schedule Tylenol 500mg  bid and obtain UA C/S.  May consider X-ray lumbar spine and pelvis. Observe.      Memory loss    Family very aware of changes. SNF for care needs.        Hypothyroidism    TSH 5.151 09/12/13, started Levothyroxine 2mcg 09/13/13 12/07/13 TSH 3.062 03/22/14 TSH 4.433     GERD (gastroesophageal reflux disease)    Stable. Continue Omeprazole       Constipation    Stable, takes Colace 100mg  bid      Chronic renal disease, stage III (Chronic)    Bun/creat 55/2.48 05/10/13  Bun/creat  59/2.49 08/15/13.  Bun/creat 62/2.6 12/07/13 03/22/14 Bun/creat 72/2.69            Review of Systems:  Review of Systems  Constitutional: Negative for fever, chills, weight loss, malaise/fatigue and diaphoresis.  HENT: Positive for hearing loss. Negative for congestion, ear pain and sore throat.   Eyes: Negative for pain, discharge and redness.  Respiratory: Negative for cough, sputum production, shortness of breath and wheezing.   Cardiovascular:  Positive for PND. Negative for chest pain, palpitations, orthopnea, claudication and leg swelling.       Resolved bradycardia-HR 60s  Gastrointestinal: Positive for heartburn. Negative for nausea, vomiting, abdominal pain, diarrhea and constipation.       Occasionally.   Genitourinary: Positive for frequency. Negative for dysuria, urgency, hematuria and flank pain.  Musculoskeletal: Positive for back pain and joint pain. Negative for myalgias, falls and neck pain.       C/o aches in lower back and legs.   Skin: Negative for itching and rash.       Small superficial right buttock pressure ulcer stage II healing. R lower leg scaling area.  Neurological: Negative for dizziness, tingling, tremors, sensory change, speech change, focal weakness, seizures, loss of consciousness, weakness and headaches.  Endo/Heme/Allergies: Negative for environmental allergies and polydipsia. Does not bruise/bleed easily.  Psychiatric/Behavioral: Positive for memory loss. Negative for depression and hallucinations. The patient is not nervous/anxious and does not have insomnia.        Increased confusion     Past Medical History  Diagnosis Date  . CVA (cerebral infarction) 1981    MI  . Type II or unspecified type diabetes mellitus without mention of complication, not stated as uncontrolled   . Diverticulosis   . History of GI diverticular bleed 2007  . HTN (  hypertension)   . AAA (abdominal aortic aneurysm)     CT 2006 3.2 cm  . Bladder cancer     Transitional cell carcinoma excised 2006  . Renal malignant tumor     transitional cell carcinoma lasered 2006  . Hyperlipidemia   . Hydrocele, left   . Inguinal hernia   . Coronary artery disease 05/05/2011    Echo EF 50%- 55% mild inferiof wall hypokinesis,mild dilatation left artium,mild mitral annular calcif. mild MR, mild TR, and mild calcif. /sclerosis trileaflet aortic valve w/ trace aortic insufficiency  . Dysrhythmia     af  . Anemia   . GERD  (gastroesophageal reflux disease)   . Arthritis   . Myocardial infarction 1981    inferior-wall MI   . Atrial fibrillation 07/04/2010    cardioversion - successful  normal sinus rhythm; May 2012 discontinue Coumadin  and increaseAspirin  . Peripheral vascular disease 11/13/2010    Abd aortic doppler mild abd aortic aneurysm 3.1 x 2.9cm  . Coronary artery disease 10/01/2009    Persantaine myview-EF 53% moderate perfusion d/t infaract/scar w/mild perinfaract ischemia seen Mid Anterior ,Mid Inferior, Apical Inferior , Basal and Mid inferolateral, Apical Lateral region  . Hypopotassemia 06/22/2012  . Edema 06/22/2012  . Congestive heart failure, unspecified 06/13/2012  . Olecranon bursitis 02/03/2012  . Pneumonia, organism unspecified 01/20/2012  . Neoplasm of uncertain behavior of skin 10/19/2011  . Unspecified venous (peripheral) insufficiency 08/26/2011  . Unspecified constipation 07/29/2011  . Lumbago 07/09/2011  . Personal history of fall 06/25/2011  . Unspecified malignant neoplasm of scalp and skin of neck 06/11/2011  . Other acquired deformity of ankle and foot(736.79) 06/11/2011  . Other alteration of consciousness 06/11/2011  . Hyposmolality and/or hyponatremia 05/07/2011  . Major depressive disorder, single episode, unspecified 05/07/2011  . Abnormality of gait 04/27/2011  . Other B-complex deficiencies 04/09/2011  . Senile dementia, uncomplicated 16/03/9603  . Phlebitis and thrombophlebitis of other deep vessels of lower extremities 04/09/2011  . Hemorrhage of rectum and anus 04/09/2011  . Loss of weight 04/09/2011  . Anemia, unspecified 9/8/209  . Malignant neoplasm of bladder, part unspecified 04/08/2008  . Malignant neoplasm of kidney, except pelvis 04/08/2005  . Venous stasis dermatitis 03/15/14   Past Surgical History  Procedure Laterality Date  . Coronary artery bypass graft  '94    LIMA-LAD, SVG-OM, PD, RCA  . Cataract extraction  2007  . Renal laser ablation of  tumor  2006  . Coronary artery bypass graft  1994    LIMA to LAD, vein to the marginal, vein to the PDA, vein to the PLA   . Appendectomy    . Eye surgery    . Incision and drainage of wound  09/10/2011    Procedure: IRRIGATION AND DEBRIDEMENT WOUND;  Surgeon: Theodoro Kos, DO;  Location: [place];  Service: Plastics;  Laterality: N/A;  irrigation and debridement scalp wound with removal of bone and placement of intregra or acell and vac   Social History:   reports that he has quit smoking. He does not have any smokeless tobacco history on file. He reports that he does not drink alcohol or use illicit drugs.  Family History  Problem Relation Age of Onset  . Diabetes Mother   . Diabetes Daughter     Medications: Patient's Medications  New Prescriptions   No medications on file  Previous Medications   ACETAMINOPHEN (TYLENOL) 500 MG TABLET    Take 500 mg by mouth 2 (two) times  daily.   AMIODARONE (PACERONE) 200 MG TABLET    Take 200 mg by mouth daily. Take one tablet daily for heart.   ASPIRIN 81 MG TABLET    Take 81 mg by mouth daily.   ATORVASTATIN (LIPITOR) 10 MG TABLET    Take 10 mg by mouth every Monday, Wednesday, and Friday.    DOCUSATE SODIUM (COLACE) 100 MG CAPSULE    Take 100 mg by mouth 2 (two) times daily.   EYELID CLEANSERS (OCUSOFT LID SCRUB PLUS) PADS    Apply topically 2 (two) times daily. Use to eyes twice daily in morning and evening.   INSULIN ASPART (NOVOLOG) 100 UNIT/ML INJECTION    Inject 5-8 Units into the skin 3 (three) times daily before meals. 5units with breakfast and dinner, 8 units with lunch for CBG>150   INSULIN DETEMIR (LEVEMIR FLEXPEN) 100 UNIT/ML INJECTION    Inject 14 Units into the skin at bedtime.    LEVOTHYROXINE (SYNTHROID, LEVOTHROID) 25 MCG TABLET    Take 25 mcg by mouth daily before breakfast.   NUTRITIONAL SUPPLEMENTS (BOOST DIABETIC) LIQD    Take by mouth. Drink 1 can daily.   OMEPRAZOLE (PRILOSEC) 20 MG CAPSULE    Take 20 mg  by mouth daily.     TRIAMCINOLONE (KENALOG) 0.025 % OINTMENT    Apply to rough skin on legs daily for 7 days, then stop   VITAMIN B-12 (CYANOCOBALAMIN) 1000 MCG TABLET    Take 1,000 mcg by mouth daily.    Modified Medications   No medications on file  Discontinued Medications   No medications on file     Physical Exam: Physical Exam  Constitutional: He is oriented to person, place, and time. He appears well-developed and well-nourished.  HENT:  Head: Normocephalic and atraumatic.  Eyes: Conjunctivae and EOM are normal. Pupils are equal, round, and reactive to light.  Neck: Normal range of motion. Neck supple. No JVD present. No thyromegaly present.  Cardiovascular: Normal rate.  An irregular rhythm present.  Murmur heard.  Systolic murmur is present with a grade of 2/6  Pulmonary/Chest: Effort normal. He has no wheezes. He has rales (bibailar dry rales). He exhibits no tenderness.  Abdominal: Soft. Bowel sounds are normal. There is no tenderness.  Musculoskeletal: Normal range of motion. He exhibits tenderness. He exhibits no edema.  Aches in lower back and legs. No decreased ROM or deformity. Ok weight bearing.   Lymphadenopathy:    He has no cervical adenopathy.  Neurological: He is alert and oriented to person, place, and time. He has normal reflexes. No cranial nerve deficit. He exhibits normal muscle tone. Coordination normal.  Skin: Skin is warm and dry. No rash noted. No erythema.  Small superficial right buttock pressure ulcer stage II healing. R lower leg scaling area.    Psychiatric: He has a normal mood and affect. His behavior is normal. Judgment and thought content normal.    Filed Vitals:   05/09/14 1550  BP: 138/70  Pulse: 76  Temp: 98 F (36.7 C)  TempSrc: Tympanic  Resp: 20   Labs reviewed: Basic Metabolic Panel:  Recent Labs  08/15/13 09/12/13 12/07/13 03/22/14  NA 138  --  136* 138  K 4.3  --  4.4 4.4  BUN 49*  --  62* 72*  CREATININE 2.5*  --   2.6* 2.7*  TSH  --  5.15 3.06 4.43   Liver Function Tests:  Recent Labs  05/10/13 08/15/13  AST 18 15  ALT 15 13  ALKPHOS 104 87   CBC:  Recent Labs  08/15/13 12/07/13 03/22/14  WBC 8.9 6.7 7.1  HGB 10.0* 10.7* 9.9*  HCT 30* 31* 30*  PLT 283 198 178   Lipid Panel: No results for input(s): CHOL, HDL, LDLCALC, TRIG, CHOLHDL, LDLDIRECT in the last 8760 hours.  Past Procedures:  08/09/13 CXR minimal cardiomegaly appears new without pulmonary vascular congestion or pleural effusion, mild elevation left hemidiaphragm unchanged, no inflammatory consolidate or suspicious nodule, mild atelectasis right lung base and left perihilar region.   Assessment/Plan Vitamin B 12 deficiency Takes Vit B12 1043mcg po daily   Type II diabetes mellitus with renal manifestations 12/07/13 Hgb A1c 6.5. Continue Levemir 14 u qd, Novolog 8 u with lunch, 5 u with breakfast and dinner for CBG>100      Hypothyroidism TSH 5.151 09/12/13, started Levothyroxine 57mcg 09/13/13 12/07/13 TSH 3.062 03/22/14 TSH 4.433   GERD (gastroesophageal reflux disease) Stable. Continue Omeprazole     Constipation Stable, takes Colace 100mg  bid    Paroxysmal supraventricular tachycardia Rate controlled. Takes Amiodarone 200mg  daily.    Osteoarthritis, multiple sites 11/18//15 c/o lower back R+L SIJs and legs, aches in nature, comes and goes. Denied dysuria, urinary frequency, or suprapubic pain. Will schedule Tylenol 500mg  bid and obtain UA C/S.  May consider X-ray lumbar spine and pelvis. Observe.    Chronic renal disease, stage III Bun/creat 55/2.48 05/10/13  Bun/creat  59/2.49 08/15/13.  Bun/creat 62/2.6 12/07/13 03/22/14 Bun/creat 72/2.69       Memory loss Family very aware of changes. SNF for care needs.        Family/ Staff Communication: observe the patient  Goals of Care: SNF  Labs/tests ordered: none

## 2014-05-09 NOTE — Assessment & Plan Note (Signed)
11/18//15 c/o lower back R+L SIJs and legs, aches in nature, comes and goes. Denied dysuria, urinary frequency, or suprapubic pain. Will schedule Tylenol 500mg bid and obtain UA C/S.  May consider X-ray lumbar spine and pelvis. Observe.   

## 2014-05-09 NOTE — Assessment & Plan Note (Signed)
Rate controlled. Takes Amiodarone 200mg daily.    

## 2014-06-06 ENCOUNTER — Non-Acute Institutional Stay (SKILLED_NURSING_FACILITY): Payer: Medicare Other | Admitting: Nurse Practitioner

## 2014-06-06 ENCOUNTER — Encounter: Payer: Self-pay | Admitting: Nurse Practitioner

## 2014-06-06 DIAGNOSIS — R413 Other amnesia: Secondary | ICD-10-CM

## 2014-06-06 DIAGNOSIS — N183 Chronic kidney disease, stage 3 unspecified: Secondary | ICD-10-CM

## 2014-06-06 DIAGNOSIS — N189 Chronic kidney disease, unspecified: Secondary | ICD-10-CM

## 2014-06-06 DIAGNOSIS — D649 Anemia, unspecified: Secondary | ICD-10-CM

## 2014-06-06 DIAGNOSIS — E1122 Type 2 diabetes mellitus with diabetic chronic kidney disease: Secondary | ICD-10-CM

## 2014-06-06 DIAGNOSIS — I471 Supraventricular tachycardia, unspecified: Secondary | ICD-10-CM

## 2014-06-06 DIAGNOSIS — M159 Polyosteoarthritis, unspecified: Secondary | ICD-10-CM

## 2014-06-06 DIAGNOSIS — M150 Primary generalized (osteo)arthritis: Secondary | ICD-10-CM

## 2014-06-06 DIAGNOSIS — E039 Hypothyroidism, unspecified: Secondary | ICD-10-CM

## 2014-06-06 DIAGNOSIS — K219 Gastro-esophageal reflux disease without esophagitis: Secondary | ICD-10-CM

## 2014-06-06 DIAGNOSIS — K5900 Constipation, unspecified: Secondary | ICD-10-CM

## 2014-06-06 DIAGNOSIS — E538 Deficiency of other specified B group vitamins: Secondary | ICD-10-CM

## 2014-06-06 NOTE — Progress Notes (Signed)
Patient ID: [name] [name], male   DOB: 30-Nov-1920, 81 y.o.   MRN: [id number]   Code Status: DNR  Allergies  Allergen Reactions  . Celebrex [Celecoxib]     Chief Complaint  Patient presents with  . Medical Management of Chronic Issues    HPI: Patient is a 81 y.o. male seen in the SNF at [facility] today for evaluation of chronic medical conditions.  Problem List Items Addressed This Visit    Vitamin B 12 deficiency - Primary    Takes Vit B12 1077mcg po daily      Type II diabetes mellitus with renal manifestations    12/07/13 Hgb A1c 6.5. Continue Levemir 14 u qd, Novolog 8 u with lunch, 5 u with breakfast and dinner for CBG>100     Paroxysmal supraventricular tachycardia    Rate controlled. Takes Amiodarone 200mg  daily.      Osteoarthritis, multiple sites    11/18//15 c/o lower back R+L SIJs and legs, aches in nature, comes and goes. Denied dysuria, urinary frequency, or suprapubic pain. Will schedule Tylenol 500mg  bid and obtain UA C/S.  May consider X-ray lumbar spine and pelvis. Observe.     Memory loss    Family very aware of changes. SNF for care needs.      Hypothyroidism    TSH 5.151 09/12/13, started Levothyroxine 48mcg 09/13/13 12/07/13 TSH 3.062 03/22/14 TSH 4.433      GERD (gastroesophageal reflux disease)    Stable. Continue Omeprazole     Constipation    Stable, takes Colace 100mg  bid       Chronic renal disease, stage III (Chronic)    Bun/creat 55/2.48 05/10/13  Bun/creat  59/2.49 08/15/13.  Bun/creat 62/2.6 12/07/13 03/22/14 Bun/creat 72/2.69      Anemia    12/07/13 Hgb 10.7 03/22/14 Hgb 9.9        Review of Systems:  Review of Systems  Constitutional: Negative for fever, chills, weight loss, malaise/fatigue and diaphoresis.  HENT: Positive for hearing loss. Negative for congestion, ear pain and sore throat.   Eyes: Negative for pain, discharge and redness.  Respiratory: Negative for cough, sputum production, shortness of breath and  wheezing.   Cardiovascular: Positive for PND. Negative for chest pain, palpitations, orthopnea, claudication and leg swelling.       Resolved bradycardia-HR 60s  Gastrointestinal: Positive for heartburn. Negative for nausea, vomiting, abdominal pain, diarrhea and constipation.       Occasionally.   Genitourinary: Positive for frequency. Negative for dysuria, urgency, hematuria and flank pain.  Musculoskeletal: Positive for back pain and joint pain. Negative for myalgias, falls and neck pain.       C/o aches in lower back and legs.   Skin: Negative for itching and rash.       Small superficial right buttock pressure ulcer stage II healing. R lower leg scaling area.  Neurological: Negative for dizziness, tingling, tremors, sensory change, speech change, focal weakness, seizures, loss of consciousness, weakness and headaches.  Endo/Heme/Allergies: Negative for environmental allergies and polydipsia. Does not bruise/bleed easily.  Psychiatric/Behavioral: Positive for memory loss. Negative for depression and hallucinations. The patient is not nervous/anxious and does not have insomnia.        Increased confusion     Past Medical History  Diagnosis Date  . CVA (cerebral infarction) 1981    MI  . Type II or unspecified type diabetes mellitus without mention of complication, not stated as uncontrolled   . Diverticulosis   . History of GI  diverticular bleed 2007  . HTN (hypertension)   . AAA (abdominal aortic aneurysm)     CT 2006 3.2 cm  . Bladder cancer     Transitional cell carcinoma excised 2006  . Renal malignant tumor     transitional cell carcinoma lasered 2006  . Hyperlipidemia   . Hydrocele, left   . Inguinal hernia   . Coronary artery disease 05/05/2011    Echo EF 50%- 55% mild inferiof wall hypokinesis,mild dilatation left artium,mild mitral annular calcif. mild MR, mild TR, and mild calcif. /sclerosis trileaflet aortic valve w/ trace aortic insufficiency  . Dysrhythmia     af    . Anemia   . GERD (gastroesophageal reflux disease)   . Arthritis   . Myocardial infarction 1981    inferior-wall MI   . Atrial fibrillation 07/04/2010    cardioversion - successful  normal sinus rhythm; May 2012 discontinue Coumadin  and increaseAspirin  . Peripheral vascular disease 11/13/2010    Abd aortic doppler mild abd aortic aneurysm 3.1 x 2.9cm  . Coronary artery disease 10/01/2009    Persantaine myview-EF 53% moderate perfusion d/t infaract/scar w/mild perinfaract ischemia seen Mid Anterior ,Mid Inferior, Apical Inferior , Basal and Mid inferolateral, Apical Lateral region  . Hypopotassemia 06/22/2012  . Edema 06/22/2012  . Congestive heart failure, unspecified 06/13/2012  . Olecranon bursitis 02/03/2012  . Pneumonia, organism unspecified 01/20/2012  . Neoplasm of uncertain behavior of skin 10/19/2011  . Unspecified venous (peripheral) insufficiency 08/26/2011  . Unspecified constipation 07/29/2011  . Lumbago 07/09/2011  . Personal history of fall 06/25/2011  . Unspecified malignant neoplasm of scalp and skin of neck 06/11/2011  . Other acquired deformity of ankle and foot(736.79) 06/11/2011  . Other alteration of consciousness 06/11/2011  . Hyposmolality and/or hyponatremia 05/07/2011  . Major depressive disorder, single episode, unspecified 05/07/2011  . Abnormality of gait 04/27/2011  . Other B-complex deficiencies 04/09/2011  . Senile dementia, uncomplicated 79/89/2119  . Phlebitis and thrombophlebitis of other deep vessels of lower extremities 04/09/2011  . Hemorrhage of rectum and anus 04/09/2011  . Loss of weight 04/09/2011  . Anemia, unspecified 9/8/209  . Malignant neoplasm of bladder, part unspecified 04/08/2008  . Malignant neoplasm of kidney, except pelvis 04/08/2005  . Venous stasis dermatitis 03/15/14   Past Surgical History  Procedure Laterality Date  . Coronary artery bypass graft  '94    LIMA-LAD, SVG-OM, PD, RCA  . Cataract extraction  2007  . Renal  laser ablation of tumor  2006  . Coronary artery bypass graft  1994    LIMA to LAD, vein to the marginal, vein to the PDA, vein to the PLA   . Appendectomy    . Eye surgery    . Incision and drainage of wound  09/10/2011    Procedure: IRRIGATION AND DEBRIDEMENT WOUND;  Surgeon: Theodoro Kos, DO;  Location: [place];  Service: Plastics;  Laterality: N/A;  irrigation and debridement scalp wound with removal of bone and placement of intregra or acell and vac   Social History:   reports that he has quit smoking. He does not have any smokeless tobacco history on file. He reports that he does not drink alcohol or use illicit drugs.  Family History  Problem Relation Age of Onset  . Diabetes Mother   . Diabetes Daughter     Medications: Patient's Medications  New Prescriptions   No medications on file  Previous Medications   ACETAMINOPHEN (TYLENOL) 500 MG TABLET    Take  500 mg by mouth 2 (two) times daily.   AMIODARONE (PACERONE) 200 MG TABLET    Take 200 mg by mouth daily. Take one tablet daily for heart.   ASPIRIN 81 MG TABLET    Take 81 mg by mouth daily.   ATORVASTATIN (LIPITOR) 10 MG TABLET    Take 10 mg by mouth every Monday, Wednesday, and Friday.    DOCUSATE SODIUM (COLACE) 100 MG CAPSULE    Take 100 mg by mouth 2 (two) times daily.   EYELID CLEANSERS (OCUSOFT LID SCRUB PLUS) PADS    Apply topically 2 (two) times daily. Use to eyes twice daily in morning and evening.   INSULIN ASPART (NOVOLOG) 100 UNIT/ML INJECTION    Inject 5-8 Units into the skin 3 (three) times daily before meals. 5units with breakfast and dinner, 8 units with lunch for CBG>150   INSULIN DETEMIR (LEVEMIR FLEXPEN) 100 UNIT/ML INJECTION    Inject 14 Units into the skin at bedtime.    LEVOTHYROXINE (SYNTHROID, LEVOTHROID) 25 MCG TABLET    Take 25 mcg by mouth daily before breakfast.   NUTRITIONAL SUPPLEMENTS (BOOST DIABETIC) LIQD    Take by mouth. Drink 1 can daily.   OMEPRAZOLE (PRILOSEC) 20 MG  CAPSULE    Take 20 mg by mouth daily.     TRIAMCINOLONE (KENALOG) 0.025 % OINTMENT    Apply to rough skin on legs daily for 7 days, then stop   VITAMIN B-12 (CYANOCOBALAMIN) 1000 MCG TABLET    Take 1,000 mcg by mouth daily.    Modified Medications   No medications on file  Discontinued Medications   No medications on file     Physical Exam: Physical Exam  Constitutional: He is oriented to person, place, and time. He appears well-developed and well-nourished.  HENT:  Head: Normocephalic and atraumatic.  Eyes: Conjunctivae and EOM are normal. Pupils are equal, round, and reactive to light.  Neck: Normal range of motion. Neck supple. No JVD present. No thyromegaly present.  Cardiovascular: Normal rate.  An irregular rhythm present.  Murmur heard.  Systolic murmur is present with a grade of 2/6  Pulmonary/Chest: Effort normal. He has no wheezes. He has rales (bibailar dry rales). He exhibits no tenderness.  Abdominal: Soft. Bowel sounds are normal. There is no tenderness.  Musculoskeletal: Normal range of motion. He exhibits tenderness. He exhibits no edema.  Aches in lower back and legs. No decreased ROM or deformity. Ok weight bearing.   Lymphadenopathy:    He has no cervical adenopathy.  Neurological: He is alert and oriented to person, place, and time. He has normal reflexes. No cranial nerve deficit. He exhibits normal muscle tone. Coordination normal.  Skin: Skin is warm and dry. No rash noted. No erythema.  Small superficial right buttock pressure ulcer stage II healing. R lower leg scaling area.    Psychiatric: He has a normal mood and affect. His behavior is normal. Judgment and thought content normal.    Filed Vitals:   06/06/14 1618  BP: 141/72  Pulse: 68  Temp: 97.4 F (36.3 C)  TempSrc: Tympanic  Resp: 18   Labs reviewed: Basic Metabolic Panel:  Recent Labs  08/15/13 09/12/13 12/07/13 03/22/14  NA 138  --  136* 138  K 4.3  --  4.4 4.4  BUN 49*  --  62* 72*    CREATININE 2.5*  --  2.6* 2.7*  TSH  --  5.15 3.06 4.43   Liver Function Tests:  Recent Labs  08/15/13  AST 15  ALT 13  ALKPHOS 87   CBC:  Recent Labs  08/15/13 12/07/13 03/22/14  WBC 8.9 6.7 7.1  HGB 10.0* 10.7* 9.9*  HCT 30* 31* 30*  PLT 283 198 178   Lipid Panel: No results for input(s): CHOL, HDL, LDLCALC, TRIG, CHOLHDL, LDLDIRECT in the last 8760 hours.  Past Procedures:  08/09/13 CXR minimal cardiomegaly appears new without pulmonary vascular congestion or pleural effusion, mild elevation left hemidiaphragm unchanged, no inflammatory consolidate or suspicious nodule, mild atelectasis right lung base and left perihilar region.   Assessment/Plan Vitamin B 12 deficiency Takes Vit B12 105mcg po daily    Type II diabetes mellitus with renal manifestations 12/07/13 Hgb A1c 6.5. Continue Levemir 14 u qd, Novolog 8 u with lunch, 5 u with breakfast and dinner for CBG>100   Paroxysmal supraventricular tachycardia Rate controlled. Takes Amiodarone 200mg  daily.    Osteoarthritis, multiple sites 11/18//15 c/o lower back R+L SIJs and legs, aches in nature, comes and goes. Denied dysuria, urinary frequency, or suprapubic pain. Will schedule Tylenol 500mg  bid and obtain UA C/S.  May consider X-ray lumbar spine and pelvis. Observe.   Memory loss Family very aware of changes. SNF for care needs.    Hypothyroidism TSH 5.151 09/12/13, started Levothyroxine 89mcg 09/13/13 12/07/13 TSH 3.062 03/22/14 TSH 4.433    GERD (gastroesophageal reflux disease) Stable. Continue Omeprazole   Constipation Stable, takes Colace 100mg  bid     Chronic renal disease, stage III Bun/creat 55/2.48 05/10/13  Bun/creat  59/2.49 08/15/13.  Bun/creat 62/2.6 12/07/13 03/22/14 Bun/creat 72/2.69    Anemia 12/07/13 Hgb 10.7 03/22/14 Hgb 9.9     Family/ Staff Communication: observe the patient  Goals of Care: SNF  Labs/tests ordered: none

## 2014-06-07 NOTE — Assessment & Plan Note (Signed)
TSH 5.151 09/12/13, started Levothyroxine 85mcg 09/13/13 12/07/13 TSH 3.062 03/22/14 TSH 4.433

## 2014-06-07 NOTE — Assessment & Plan Note (Signed)
12/07/13 Hgb 10.7 03/22/14 Hgb 9.9

## 2014-06-07 NOTE — Assessment & Plan Note (Signed)
Stable, takes Colace 100mg  bid

## 2014-06-07 NOTE — Assessment & Plan Note (Signed)
Takes Vit B12 1050mcg po daily

## 2014-06-07 NOTE — Assessment & Plan Note (Signed)
Family very aware of changes. SNF for care needs.

## 2014-06-07 NOTE — Assessment & Plan Note (Signed)
12/07/13 Hgb A1c 6.5. Continue Levemir 14 u qd, Novolog 8 u with lunch, 5 u with breakfast and dinner for CBG>100

## 2014-06-07 NOTE — Assessment & Plan Note (Signed)
Rate controlled. Takes Amiodarone 200mg  daily.

## 2014-06-07 NOTE — Assessment & Plan Note (Signed)
Bun/creat 55/2.48 05/10/13  Bun/creat  59/2.49 08/15/13.  Bun/creat 62/2.6 12/07/13 03/22/14 Bun/creat 72/2.69

## 2014-06-07 NOTE — Assessment & Plan Note (Signed)
Stable.  Continue Omeprazole.

## 2014-06-07 NOTE — Assessment & Plan Note (Signed)
11/18//15 c/o lower back R+L SIJs and legs, aches in nature, comes and goes. Denied dysuria, urinary frequency, or suprapubic pain. Will schedule Tylenol 500mg  bid and obtain UA C/S.  May consider X-ray lumbar spine and pelvis. Observe.

## 2014-06-11 ENCOUNTER — Non-Acute Institutional Stay (SKILLED_NURSING_FACILITY): Payer: Medicare Other | Admitting: Nurse Practitioner

## 2014-06-11 ENCOUNTER — Encounter: Payer: Self-pay | Admitting: Nurse Practitioner

## 2014-06-11 DIAGNOSIS — E039 Hypothyroidism, unspecified: Secondary | ICD-10-CM

## 2014-06-11 DIAGNOSIS — D649 Anemia, unspecified: Secondary | ICD-10-CM

## 2014-06-11 DIAGNOSIS — K219 Gastro-esophageal reflux disease without esophagitis: Secondary | ICD-10-CM

## 2014-06-11 DIAGNOSIS — L309 Dermatitis, unspecified: Secondary | ICD-10-CM

## 2014-06-11 DIAGNOSIS — N183 Chronic kidney disease, stage 3 unspecified: Secondary | ICD-10-CM

## 2014-06-11 DIAGNOSIS — R413 Other amnesia: Secondary | ICD-10-CM

## 2014-06-11 DIAGNOSIS — M150 Primary generalized (osteo)arthritis: Secondary | ICD-10-CM

## 2014-06-11 DIAGNOSIS — K5900 Constipation, unspecified: Secondary | ICD-10-CM

## 2014-06-11 DIAGNOSIS — I471 Supraventricular tachycardia: Secondary | ICD-10-CM

## 2014-06-11 DIAGNOSIS — M159 Polyosteoarthritis, unspecified: Secondary | ICD-10-CM

## 2014-06-11 DIAGNOSIS — N189 Chronic kidney disease, unspecified: Secondary | ICD-10-CM

## 2014-06-11 DIAGNOSIS — E1122 Type 2 diabetes mellitus with diabetic chronic kidney disease: Secondary | ICD-10-CM

## 2014-06-11 NOTE — Assessment & Plan Note (Addendum)
  Family very aware of changes. SNF for care needs.

## 2014-06-11 NOTE — Assessment & Plan Note (Signed)
TSH 5.151 09/12/13, started Levothyroxine 25mcg 09/13/13 12/07/13 TSH 3.062 03/22/14 TSH 4.433

## 2014-06-11 NOTE — Assessment & Plan Note (Signed)
12/07/13 Hgb 10.7 03/22/14 Hgb 9.9

## 2014-06-11 NOTE — Assessment & Plan Note (Signed)
Stable, takes Colace 100mg  bid

## 2014-06-11 NOTE — Progress Notes (Signed)
Patient ID: [name] [name], male   DOB: 14-Jul-1920, 81 y.o.   MRN: [id number]   Code Status: DNR  Allergies  Allergen Reactions  . Celebrex [Celecoxib]     Chief Complaint  Patient presents with  . Medical Management of Chronic Issues  . Acute Visit    rash BLE and lower back    HPI: Patient is a 81 y.o. male seen in the SNF at [facility] today for evaluation of BLE/lower back rash and chronic medical conditions.  Problem List Items Addressed This Visit    Type II diabetes mellitus with renal manifestations    12/07/13 Hgb A1c 6.5. Continue Levemir 14 u qd, Novolog 8 u with lunch, 5 u with breakfast and dinner for CBG>100      Paroxysmal supraventricular tachycardia    Rate controlled. Takes Amiodarone 200mg  daily.       Osteoarthritis, multiple sites    11/18//15 c/o lower back R+L SIJs and legs, aches in nature, comes and goes. Denied dysuria, urinary frequency, or suprapubic pain. Will schedule Tylenol 500mg  bid and obtain UA C/S.  May consider X-ray lumbar spine and pelvis. Observe.      Memory loss     Family very aware of changes. SNF for care needs.       Hypothyroidism    TSH 5.151 09/12/13, started Levothyroxine 69mcg 09/13/13 12/07/13 TSH 3.062 03/22/14 TSH 4.433      GERD (gastroesophageal reflux disease)    Stable. Continue Omeprazole      Dermatitis - Primary    Most likely dry skin dermatitis-no new-responded well to Triamcinolone cream with rashes on legs-will continue to BLU/lower back-observe.     Constipation    Stable, takes Colace 100mg  bid      Chronic renal disease, stage III (Chronic)    Bun/creat 55/2.48 05/10/13  Bun/creat  59/2.49 08/15/13.  Bun/creat 62/2.6 12/07/13 03/22/14 Bun/creat 72/2.69       Anemia    12/07/13 Hgb 10.7 03/22/14 Hgb 9.9         Review of Systems:  Review of Systems  Constitutional: Negative for fever, chills, weight loss, malaise/fatigue and diaphoresis.  HENT: Positive for hearing loss.  Negative for congestion, ear pain and sore throat.   Eyes: Negative for pain, discharge and redness.  Respiratory: Negative for cough, sputum production, shortness of breath and wheezing.   Cardiovascular: Positive for PND. Negative for chest pain, palpitations, orthopnea, claudication and leg swelling.       Resolved bradycardia-HR 60s  Gastrointestinal: Positive for heartburn. Negative for nausea, vomiting, abdominal pain, diarrhea and constipation.       Occasionally.   Genitourinary: Positive for frequency. Negative for dysuria, urgency, hematuria and flank pain.  Musculoskeletal: Positive for back pain and joint pain. Negative for myalgias, falls and neck pain.       C/o aches in lower back and legs.   Skin: Positive for rash. Negative for itching.       Itching rash BLE and lower back.   Neurological: Negative for dizziness, tingling, tremors, sensory change, speech change, focal weakness, seizures, loss of consciousness, weakness and headaches.  Endo/Heme/Allergies: Negative for environmental allergies and polydipsia. Does not bruise/bleed easily.  Psychiatric/Behavioral: Positive for memory loss. Negative for depression and hallucinations. The patient is not nervous/anxious and does not have insomnia.        Increased confusion     Past Medical History  Diagnosis Date  . CVA (cerebral infarction) 1981    MI  .  Type II or unspecified type diabetes mellitus without mention of complication, not stated as uncontrolled   . Diverticulosis   . History of GI diverticular bleed 2007  . HTN (hypertension)   . AAA (abdominal aortic aneurysm)     CT 2006 3.2 cm  . Bladder cancer     Transitional cell carcinoma excised 2006  . Renal malignant tumor     transitional cell carcinoma lasered 2006  . Hyperlipidemia   . Hydrocele, left   . Inguinal hernia   . Coronary artery disease 05/05/2011    Echo EF 50%- 55% mild inferiof wall hypokinesis,mild dilatation left artium,mild mitral annular  calcif. mild MR, mild TR, and mild calcif. /sclerosis trileaflet aortic valve w/ trace aortic insufficiency  . Dysrhythmia     af  . Anemia   . GERD (gastroesophageal reflux disease)   . Arthritis   . Myocardial infarction 1981    inferior-wall MI   . Atrial fibrillation 07/04/2010    cardioversion - successful  normal sinus rhythm; May 2012 discontinue Coumadin  and increaseAspirin  . Peripheral vascular disease 11/13/2010    Abd aortic doppler mild abd aortic aneurysm 3.1 x 2.9cm  . Coronary artery disease 10/01/2009    Persantaine myview-EF 53% moderate perfusion d/t infaract/scar w/mild perinfaract ischemia seen Mid Anterior ,Mid Inferior, Apical Inferior , Basal and Mid inferolateral, Apical Lateral region  . Hypopotassemia 06/22/2012  . Edema 06/22/2012  . Congestive heart failure, unspecified 06/13/2012  . Olecranon bursitis 02/03/2012  . Pneumonia, organism unspecified 01/20/2012  . Neoplasm of uncertain behavior of skin 10/19/2011  . Unspecified venous (peripheral) insufficiency 08/26/2011  . Unspecified constipation 07/29/2011  . Lumbago 07/09/2011  . Personal history of fall 06/25/2011  . Unspecified malignant neoplasm of scalp and skin of neck 06/11/2011  . Other acquired deformity of ankle and foot(736.79) 06/11/2011  . Other alteration of consciousness 06/11/2011  . Hyposmolality and/or hyponatremia 05/07/2011  . Major depressive disorder, single episode, unspecified 05/07/2011  . Abnormality of gait 04/27/2011  . Other B-complex deficiencies 04/09/2011  . Senile dementia, uncomplicated 62/70/3500  . Phlebitis and thrombophlebitis of other deep vessels of lower extremities 04/09/2011  . Hemorrhage of rectum and anus 04/09/2011  . Loss of weight 04/09/2011  . Anemia, unspecified 9/8/209  . Malignant neoplasm of bladder, part unspecified 04/08/2008  . Malignant neoplasm of kidney, except pelvis 04/08/2005  . Venous stasis dermatitis 03/15/14   Past Surgical History    Procedure Laterality Date  . Coronary artery bypass graft  '94    LIMA-LAD, SVG-OM, PD, RCA  . Cataract extraction  2007  . Renal laser ablation of tumor  2006  . Coronary artery bypass graft  1994    LIMA to LAD, vein to the marginal, vein to the PDA, vein to the PLA   . Appendectomy    . Eye surgery    . Incision and drainage of wound  09/10/2011    Procedure: IRRIGATION AND DEBRIDEMENT WOUND;  Surgeon: Theodoro Kos, DO;  Location: [facility];  Service: Plastics;  Laterality: N/A;  irrigation and debridement scalp wound with removal of bone and placement of intregra or acell and vac   Social History:   reports that he has quit smoking. He does not have any smokeless tobacco history on file. He reports that he does not drink alcohol or use illicit drugs.  Family History  Problem Relation Age of Onset  . Diabetes Mother   . Diabetes Daughter     Medications:  Patient's Medications  New Prescriptions   No medications on file  Previous Medications   ACETAMINOPHEN (TYLENOL) 500 MG TABLET    Take 500 mg by mouth 2 (two) times daily.   AMIODARONE (PACERONE) 200 MG TABLET    Take 200 mg by mouth daily. Take one tablet daily for heart.   ASPIRIN 81 MG TABLET    Take 81 mg by mouth daily.   ATORVASTATIN (LIPITOR) 10 MG TABLET    Take 10 mg by mouth every Monday, Wednesday, and Friday.    DOCUSATE SODIUM (COLACE) 100 MG CAPSULE    Take 100 mg by mouth 2 (two) times daily.   EYELID CLEANSERS (OCUSOFT LID SCRUB PLUS) PADS    Apply topically 2 (two) times daily. Use to eyes twice daily in morning and evening.   INSULIN ASPART (NOVOLOG) 100 UNIT/ML INJECTION    Inject 5-8 Units into the skin 3 (three) times daily before meals. 5units with breakfast and dinner, 8 units with lunch for CBG>150   INSULIN DETEMIR (LEVEMIR FLEXPEN) 100 UNIT/ML INJECTION    Inject 14 Units into the skin at bedtime.    LEVOTHYROXINE (SYNTHROID, LEVOTHROID) 25 MCG TABLET    Take 25 mcg by mouth daily  before breakfast.   NUTRITIONAL SUPPLEMENTS (BOOST DIABETIC) LIQD    Take by mouth. Drink 1 can daily.   OMEPRAZOLE (PRILOSEC) 20 MG CAPSULE    Take 20 mg by mouth daily.     TRIAMCINOLONE (KENALOG) 0.025 % OINTMENT    Apply to rough skin on legs daily for 7 days, then stop   VITAMIN B-12 (CYANOCOBALAMIN) 1000 MCG TABLET    Take 1,000 mcg by mouth daily.    Modified Medications   No medications on file  Discontinued Medications   No medications on file     Physical Exam: Physical Exam  Constitutional: He is oriented to person, place, and time. He appears well-developed and well-nourished.  HENT:  Head: Normocephalic and atraumatic.  Eyes: Conjunctivae and EOM are normal. Pupils are equal, round, and reactive to light.  Neck: Normal range of motion. Neck supple. No JVD present. No thyromegaly present.  Cardiovascular: Normal rate.  An irregular rhythm present.  Murmur heard.  Systolic murmur is present with a grade of 2/6  Pulmonary/Chest: Effort normal. He has no wheezes. He has rales (bibailar dry rales). He exhibits no tenderness.  Abdominal: Soft. Bowel sounds are normal. There is no tenderness.  Musculoskeletal: Normal range of motion. He exhibits tenderness. He exhibits no edema.  Aches in lower back and legs. No decreased ROM or deformity. Ok weight bearing.   Lymphadenopathy:    He has no cervical adenopathy.  Neurological: He is alert and oriented to person, place, and time. He has normal reflexes. No cranial nerve deficit. He exhibits normal muscle tone. Coordination normal.  Skin: Skin is warm and dry. No rash noted. No erythema.   Itching rash BLE and lower back.    Psychiatric: He has a normal mood and affect. His behavior is normal. Judgment and thought content normal.    Filed Vitals:   06/11/14 0951  BP: 132/64  Pulse: 66  Temp: 98.4 F (36.9 C)  TempSrc: Tympanic  Resp: 18   Labs reviewed: Basic Metabolic Panel:  Recent Labs  08/15/13 09/12/13 12/07/13  03/22/14  NA 138  --  136* 138  K 4.3  --  4.4 4.4  BUN 49*  --  62* 72*  CREATININE 2.5*  --  2.6* 2.7*  TSH  --  5.15 3.06 4.43   Liver Function Tests:  Recent Labs  08/15/13  AST 15  ALT 13  ALKPHOS 87   CBC:  Recent Labs  08/15/13 12/07/13 03/22/14  WBC 8.9 6.7 7.1  HGB 10.0* 10.7* 9.9*  HCT 30* 31* 30*  PLT 283 198 178   Lipid Panel: No results for input(s): CHOL, HDL, LDLCALC, TRIG, CHOLHDL, LDLDIRECT in the last 8760 hours.  Past Procedures:  08/09/13 CXR minimal cardiomegaly appears new without pulmonary vascular congestion or pleural effusion, mild elevation left hemidiaphragm unchanged, no inflammatory consolidate or suspicious nodule, mild atelectasis right lung base and left perihilar region.   Assessment/Plan Dermatitis Most likely dry skin dermatitis-no new-responded well to Triamcinolone cream with rashes on legs-will continue to BLU/lower back-observe.   Anemia 12/07/13 Hgb 10.7 03/22/14 Hgb 9.9    Chronic renal disease, stage III Bun/creat 55/2.48 05/10/13  Bun/creat  59/2.49 08/15/13.  Bun/creat 62/2.6 12/07/13 03/22/14 Bun/creat 72/2.69     Constipation Stable, takes Colace 100mg  bid    GERD (gastroesophageal reflux disease) Stable. Continue Omeprazole    Hypothyroidism TSH 5.151 09/12/13, started Levothyroxine 73mcg 09/13/13 12/07/13 TSH 3.062 03/22/14 TSH 4.433    Memory loss  Family very aware of changes. SNF for care needs.     Osteoarthritis, multiple sites 11/18//15 c/o lower back R+L SIJs and legs, aches in nature, comes and goes. Denied dysuria, urinary frequency, or suprapubic pain. Will schedule Tylenol 500mg  bid and obtain UA C/S.  May consider X-ray lumbar spine and pelvis. Observe.    Paroxysmal supraventricular tachycardia Rate controlled. Takes Amiodarone 200mg  daily.     Type II diabetes mellitus with renal manifestations 12/07/13 Hgb A1c 6.5. Continue Levemir 14 u qd, Novolog 8 u with lunch, 5 u with breakfast  and dinner for CBG>100      Family/ Staff Communication: observe the patient  Goals of Care: SNF  Labs/tests ordered: none

## 2014-06-11 NOTE — Assessment & Plan Note (Signed)
12/07/13 Hgb A1c 6.5. Continue Levemir 14 u qd, Novolog 8 u with lunch, 5 u with breakfast and dinner for CBG>100

## 2014-06-11 NOTE — Assessment & Plan Note (Signed)
Stable.  Continue Omeprazole.

## 2014-06-11 NOTE — Assessment & Plan Note (Signed)
Bun/creat 55/2.48 05/10/13  Bun/creat  59/2.49 08/15/13.  Bun/creat 62/2.6 12/07/13 03/22/14 Bun/creat 72/2.69

## 2014-06-11 NOTE — Assessment & Plan Note (Signed)
Most likely dry skin dermatitis-no new-responded well to Triamcinolone cream with rashes on legs-will continue to BLU/lower back-observe.

## 2014-06-11 NOTE — Assessment & Plan Note (Signed)
Rate controlled. Takes Amiodarone 200mg  daily.

## 2014-06-11 NOTE — Assessment & Plan Note (Signed)
11/18//15 c/o lower back R+L SIJs and legs, aches in nature, comes and goes. Denied dysuria, urinary frequency, or suprapubic pain. Will schedule Tylenol 500mg  bid and obtain UA C/S.  May consider X-ray lumbar spine and pelvis. Observe.

## 2014-06-14 ENCOUNTER — Encounter (HOSPITAL_BASED_OUTPATIENT_CLINIC_OR_DEPARTMENT_OTHER): Payer: Self-pay | Admitting: Plastic Surgery

## 2014-07-04 ENCOUNTER — Non-Acute Institutional Stay (SKILLED_NURSING_FACILITY): Payer: Medicare Other | Admitting: Nurse Practitioner

## 2014-07-04 ENCOUNTER — Encounter: Payer: Self-pay | Admitting: Nurse Practitioner

## 2014-07-04 DIAGNOSIS — N183 Chronic kidney disease, stage 3 unspecified: Secondary | ICD-10-CM

## 2014-07-04 DIAGNOSIS — I2510 Atherosclerotic heart disease of native coronary artery without angina pectoris: Secondary | ICD-10-CM

## 2014-07-04 DIAGNOSIS — I471 Supraventricular tachycardia, unspecified: Secondary | ICD-10-CM

## 2014-07-04 DIAGNOSIS — R5382 Chronic fatigue, unspecified: Secondary | ICD-10-CM

## 2014-07-04 DIAGNOSIS — E1121 Type 2 diabetes mellitus with diabetic nephropathy: Secondary | ICD-10-CM

## 2014-07-04 DIAGNOSIS — K5900 Constipation, unspecified: Secondary | ICD-10-CM

## 2014-07-04 DIAGNOSIS — E538 Deficiency of other specified B group vitamins: Secondary | ICD-10-CM

## 2014-07-04 DIAGNOSIS — E039 Hypothyroidism, unspecified: Secondary | ICD-10-CM

## 2014-07-04 DIAGNOSIS — K219 Gastro-esophageal reflux disease without esophagitis: Secondary | ICD-10-CM

## 2014-07-04 DIAGNOSIS — R5381 Other malaise: Secondary | ICD-10-CM

## 2014-07-04 NOTE — Assessment & Plan Note (Signed)
TSH 5.151 09/12/13, started Levothyroxine 75mcg 09/13/13 12/07/13 TSH 3.062 03/22/14 TSH 4.433 07/04/14 update TSH

## 2014-07-04 NOTE — Assessment & Plan Note (Signed)
Bun/creat 55/2.48 05/10/13  Bun/creat  59/2.49 08/15/13.  Bun/creat 62/2.6 12/07/13 03/22/14 Bun/creat 72/2.69 07/04/14 update renal function test.

## 2014-07-04 NOTE — Assessment & Plan Note (Signed)
Stable, takes Colace 100mg  bid

## 2014-07-04 NOTE — Assessment & Plan Note (Signed)
Rate controlled. Takes Amiodarone 200mg  daily.

## 2014-07-04 NOTE — Assessment & Plan Note (Signed)
The patient stated he wakes up 2-3x/night and takes him a while to return to asleep and admitted he sleeps more than usual during day. Workup: CBC, CMP, TSH, Hgb A1c, and UA C/S. May consider Mirtazapine 7.5mg  nightly if labs unremarkable.

## 2014-07-04 NOTE — Assessment & Plan Note (Signed)
12/07/13 Hgb A1c 6.5. Continue Levemir 14 u qd, Novolog 8 u with lunch, 5 u with breakfast and dinner for CBG>100 07/04/14 update Hgb A1c and CMP

## 2014-07-04 NOTE — Assessment & Plan Note (Signed)
Continue risk reduction: ASA and Atorvastatin.

## 2014-07-04 NOTE — Progress Notes (Signed)
Patient ID: [name] [name], male   DOB: [birth year], 81 y.o.   MRN: [id number]   Code Status: DNR  Allergies  Allergen Reactions  . Celebrex [Celecoxib]     Chief Complaint  Patient presents with  . Medical Management of Chronic Issues  . Acute Visit    falling asleep during conversation-sleeps more than usual.     HPI: Patient is a 81 y.o. male seen in the SNF at [facility] today for evaluation of malaise and chronic medical conditions.  Problem List Items Addressed This Visit    Vitamin B 12 deficiency - Primary    Takes Vit B12 101mcg po daily. Update CBC       Type II diabetes mellitus with renal manifestations    12/07/13 Hgb A1c 6.5. Continue Levemir 14 u qd, Novolog 8 u with lunch, 5 u with breakfast and dinner for CBG>100 07/04/14 update Hgb A1c and CMP         Paroxysmal supraventricular tachycardia    Rate controlled. Takes Amiodarone 200mg  daily.         Hypothyroidism    TSH 5.151 09/12/13, started Levothyroxine 67mcg 09/13/13 12/07/13 TSH 3.062 03/22/14 TSH 4.433 07/04/14 update TSH      GERD (gastroesophageal reflux disease)    Stable. Continue Omeprazole        Coronary atherosclerosis    Continue risk reduction: ASA and Atorvastatin.       Constipation    Stable, takes Colace 100mg  bid       Chronic renal disease, stage III (Chronic)    Bun/creat 55/2.48 05/10/13  Bun/creat  59/2.49 08/15/13.  Bun/creat 62/2.6 12/07/13 03/22/14 Bun/creat 72/2.69 07/04/14 update renal function test.         Chronic fatigue and malaise    The patient stated he wakes up 2-3x/night and takes him a while to return to asleep and admitted he sleeps more than usual during day. Workup: CBC, CMP, TSH, Hgb A1c, and UA C/S. May consider Mirtazapine 7.5mg  nightly if labs unremarkable.          Review of Systems:  Review of Systems  Constitutional: Positive for malaise/fatigue. Negative for fever, weight loss and diaphoresis.  HENT: Positive for hearing loss.  Negative for congestion, ear discharge, ear pain, nosebleeds, sore throat and tinnitus.   Eyes: Negative for blurred vision, double vision, photophobia, pain, discharge and redness.  Respiratory: Positive for cough. Negative for hemoptysis, sputum production, shortness of breath, wheezing and stridor.   Cardiovascular: Negative for chest pain, palpitations, orthopnea, claudication, leg swelling and PND.  Gastrointestinal: Negative for heartburn, nausea, vomiting, abdominal pain, diarrhea, constipation, blood in stool and melena.  Genitourinary: Positive for frequency. Negative for dysuria, urgency, hematuria and flank pain.  Musculoskeletal: Negative for myalgias, back pain, joint pain, falls and neck pain.  Skin: Negative for itching and rash.  Neurological: Negative for dizziness, tingling, tremors, sensory change, speech change, focal weakness, seizures, loss of consciousness, weakness and headaches.  Endo/Heme/Allergies: Negative for environmental allergies and polydipsia. Does not bruise/bleed easily.  Psychiatric/Behavioral: Positive for memory loss. Negative for depression, suicidal ideas, hallucinations and substance abuse. The patient is not nervous/anxious and does not have insomnia.      Past Medical History  Diagnosis Date  . CVA (cerebral infarction) 1981    MI  . Type II or unspecified type diabetes mellitus without mention of complication, not stated as uncontrolled   . Diverticulosis   . History of GI diverticular bleed 2007  . HTN (hypertension)   .  AAA (abdominal aortic aneurysm)     CT 2006 3.2 cm  . Bladder cancer     Transitional cell carcinoma excised 2006  . Renal malignant tumor     transitional cell carcinoma lasered 2006  . Hyperlipidemia   . Hydrocele, left   . Inguinal hernia   . Coronary artery disease 05/05/2011    Echo EF 50%- 55% mild inferiof wall hypokinesis,mild dilatation left artium,mild mitral annular calcif. mild MR, mild TR, and mild calcif.  /sclerosis trileaflet aortic valve w/ trace aortic insufficiency  . Dysrhythmia     af  . Anemia   . GERD (gastroesophageal reflux disease)   . Arthritis   . Myocardial infarction 1981    inferior-wall MI   . Atrial fibrillation 07/04/2010    cardioversion - successful  normal sinus rhythm; May 2012 discontinue Coumadin  and increaseAspirin  . Peripheral vascular disease 11/13/2010    Abd aortic doppler mild abd aortic aneurysm 3.1 x 2.9cm  . Coronary artery disease 10/01/2009    Persantaine myview-EF 53% moderate perfusion d/t infaract/scar w/mild perinfaract ischemia seen Mid Anterior ,Mid Inferior, Apical Inferior , Basal and Mid inferolateral, Apical Lateral region  . Hypopotassemia 06/22/2012  . Edema 06/22/2012  . Congestive heart failure, unspecified 06/13/2012  . Olecranon bursitis 02/03/2012  . Pneumonia, organism unspecified 01/20/2012  . Neoplasm of uncertain behavior of skin 10/19/2011  . Unspecified venous (peripheral) insufficiency 08/26/2011  . Unspecified constipation 07/29/2011  . Lumbago 07/09/2011  . Personal history of fall 06/25/2011  . Unspecified malignant neoplasm of scalp and skin of neck 06/11/2011  . Other acquired deformity of ankle and foot(736.79) 06/11/2011  . Other alteration of consciousness 06/11/2011  . Hyposmolality and/or hyponatremia 05/07/2011  . Major depressive disorder, single episode, unspecified 05/07/2011  . Abnormality of gait 04/27/2011  . Other B-complex deficiencies 04/09/2011  . Senile dementia, uncomplicated 68/34/1962  . Phlebitis and thrombophlebitis of other deep vessels of lower extremities 04/09/2011  . Hemorrhage of rectum and anus 04/09/2011  . Loss of weight 04/09/2011  . Anemia, unspecified 9/8/209  . Malignant neoplasm of bladder, part unspecified 04/08/2008  . Malignant neoplasm of kidney, except pelvis 04/08/2005  . Venous stasis dermatitis 03/15/14   Past Surgical History  Procedure Laterality Date  . Coronary  artery bypass graft  '94    LIMA-LAD, SVG-OM, PD, RCA  . Cataract extraction  2007  . Renal laser ablation of tumor  2006  . Coronary artery bypass graft  1994    LIMA to LAD, vein to the marginal, vein to the PDA, vein to the PLA   . Appendectomy    . Eye surgery     Social History:   reports that he has quit smoking. He does not have any smokeless tobacco history on file. He reports that he does not drink alcohol or use illicit drugs.  Family History  Problem Relation Age of Onset  . Diabetes Mother   . Diabetes Daughter     Medications: Patient's Medications  New Prescriptions   No medications on file  Previous Medications   ACETAMINOPHEN (TYLENOL) 500 MG TABLET    Take 500 mg by mouth 2 (two) times daily.   AMIODARONE (PACERONE) 200 MG TABLET    Take 200 mg by mouth daily. Take one tablet daily for heart.   ASPIRIN 81 MG TABLET    Take 81 mg by mouth daily.   ATORVASTATIN (LIPITOR) 10 MG TABLET    Take 10 mg by mouth every Monday, Wednesday,  and Friday.    DOCUSATE SODIUM (COLACE) 100 MG CAPSULE    Take 100 mg by mouth 2 (two) times daily.   EYELID CLEANSERS (OCUSOFT LID SCRUB PLUS) PADS    Apply topically 2 (two) times daily. Use to eyes twice daily in morning and evening.   INSULIN ASPART (NOVOLOG) 100 UNIT/ML INJECTION    Inject 5-8 Units into the skin 3 (three) times daily before meals. 5units with breakfast and dinner, 8 units with lunch for CBG>150   INSULIN DETEMIR (LEVEMIR FLEXPEN) 100 UNIT/ML INJECTION    Inject 14 Units into the skin at bedtime.    LEVOTHYROXINE (SYNTHROID, LEVOTHROID) 25 MCG TABLET    Take 25 mcg by mouth daily before breakfast.   NUTRITIONAL SUPPLEMENTS (BOOST DIABETIC) LIQD    Take by mouth. Drink 1 can daily.   OMEPRAZOLE (PRILOSEC) 20 MG CAPSULE    Take 20 mg by mouth daily.     TRIAMCINOLONE (KENALOG) 0.025 % OINTMENT    Apply to rough skin on legs daily for 7 days, then stop   VITAMIN B-12 (CYANOCOBALAMIN) 1000 MCG TABLET    Take 1,000 mcg by  mouth daily.    Modified Medications   No medications on file  Discontinued Medications   No medications on file     Physical Exam: Physical Exam  Constitutional: He is oriented to person, place, and time. He appears well-developed and well-nourished.  HENT:  Head: Normocephalic and atraumatic.  Eyes: Conjunctivae and EOM are normal. Pupils are equal, round, and reactive to light.  Neck: Normal range of motion. Neck supple. No JVD present. No thyromegaly present.  Cardiovascular: Normal rate.  An irregular rhythm present.  Murmur heard.  Systolic murmur is present with a grade of 2/6  Pulmonary/Chest: Effort normal. He has no wheezes. He has rales (bibailar dry rales). He exhibits no tenderness.  Abdominal: Soft. Bowel sounds are normal. There is no tenderness.  Musculoskeletal: Normal range of motion. He exhibits no edema or tenderness.  Lymphadenopathy:    He has no cervical adenopathy.  Neurological: He is alert and oriented to person, place, and time. He has normal reflexes. No cranial nerve deficit. He exhibits normal muscle tone. Coordination normal.  Skin: Skin is warm and dry. No rash noted. No erythema.     Psychiatric: He has a normal mood and affect. His behavior is normal. Judgment and thought content normal.    Filed Vitals:   07/04/14 1012  BP: 132/68  Pulse: 70  Temp: 97.3 F (36.3 C)  TempSrc: Tympanic  Resp: 18   Labs reviewed: Basic Metabolic Panel:  Recent Labs  08/15/13 09/12/13 12/07/13 03/22/14  NA 138  --  136* 138  K 4.3  --  4.4 4.4  BUN 49*  --  62* 72*  CREATININE 2.5*  --  2.6* 2.7*  TSH  --  5.15 3.06 4.43   Liver Function Tests:  Recent Labs  08/15/13  AST 15  ALT 13  ALKPHOS 87   CBC:  Recent Labs  08/15/13 12/07/13 03/22/14  WBC 8.9 6.7 7.1  HGB 10.0* 10.7* 9.9*  HCT 30* 31* 30*  PLT 283 198 178   Lipid Panel: No results for input(s): CHOL, HDL, LDLCALC, TRIG, CHOLHDL, LDLDIRECT in the last 8760 hours.  Past  Procedures:  08/09/13 CXR minimal cardiomegaly appears new without pulmonary vascular congestion or pleural effusion, mild elevation left hemidiaphragm unchanged, no inflammatory consolidate or suspicious nodule, mild atelectasis right lung base and left perihilar region.  Assessment/Plan Vitamin B 12 deficiency Takes Vit B12 1068mcg po daily. Update CBC    Type II diabetes mellitus with renal manifestations 12/07/13 Hgb A1c 6.5. Continue Levemir 14 u qd, Novolog 8 u with lunch, 5 u with breakfast and dinner for CBG>100 07/04/14 update Hgb A1c and CMP      Hypothyroidism TSH 5.151 09/12/13, started Levothyroxine 13mcg 09/13/13 12/07/13 TSH 3.062 03/22/14 TSH 4.433 07/04/14 update TSH   GERD (gastroesophageal reflux disease) Stable. Continue Omeprazole     Chronic renal disease, stage III Bun/creat 55/2.48 05/10/13  Bun/creat  59/2.49 08/15/13.  Bun/creat 62/2.6 12/07/13 03/22/14 Bun/creat 72/2.69 07/04/14 update renal function test.      Coronary atherosclerosis Continue risk reduction: ASA and Atorvastatin.    Constipation Stable, takes Colace 100mg  bid    Paroxysmal supraventricular tachycardia Rate controlled. Takes Amiodarone 200mg  daily.      Chronic fatigue and malaise The patient stated he wakes up 2-3x/night and takes him a while to return to asleep and admitted he sleeps more than usual during day. Workup: CBC, CMP, TSH, Hgb A1c, and UA C/S. May consider Mirtazapine 7.5mg  nightly if labs unremarkable.      Family/ Staff Communication: observe the patient  Goals of Care: SNF  Labs/tests ordered: CBC, CMP, TSH, Hgb A1c, UA C/S

## 2014-07-04 NOTE — Assessment & Plan Note (Signed)
Takes Vit B12 1074mcg po daily. Update CBC

## 2014-07-04 NOTE — Assessment & Plan Note (Signed)
Stable.  Continue Omeprazole.

## 2014-07-05 LAB — HEPATIC FUNCTION PANEL
ALT: 18 U/L (ref 10–40)
AST: 19 U/L (ref 14–40)
Alkaline Phosphatase: 94 U/L (ref 25–125)
Bilirubin, Total: 0.3 mg/dL

## 2014-07-05 LAB — CBC AND DIFFERENTIAL
HEMATOCRIT: 26 % — AB (ref 41–53)
Hemoglobin: 8.8 g/dL — AB (ref 13.5–17.5)
Platelets: 210 10*3/uL (ref 150–399)
WBC: 6.2 10*3/mL

## 2014-07-05 LAB — BASIC METABOLIC PANEL
BUN: 91 mg/dL — AB (ref 4–21)
Creatinine: 2.7 mg/dL — AB (ref 0.6–1.3)
GLUCOSE: 212 mg/dL
Potassium: 4.6 mmol/L (ref 3.4–5.3)
SODIUM: 137 mmol/L (ref 137–147)

## 2014-07-05 LAB — HEMOGLOBIN A1C: Hgb A1c MFr Bld: 6.7 % — AB (ref 4.0–6.0)

## 2014-07-05 LAB — TSH: TSH: 4.3 u[IU]/mL (ref 0.41–5.90)

## 2014-07-09 ENCOUNTER — Other Ambulatory Visit: Payer: Self-pay | Admitting: Nurse Practitioner

## 2014-07-09 DIAGNOSIS — E1121 Type 2 diabetes mellitus with diabetic nephropathy: Secondary | ICD-10-CM

## 2014-07-09 DIAGNOSIS — D631 Anemia in chronic kidney disease: Secondary | ICD-10-CM

## 2014-07-09 DIAGNOSIS — E039 Hypothyroidism, unspecified: Secondary | ICD-10-CM

## 2014-07-09 DIAGNOSIS — N183 Chronic kidney disease, stage 3 unspecified: Secondary | ICD-10-CM

## 2014-07-09 DIAGNOSIS — N189 Chronic kidney disease, unspecified: Secondary | ICD-10-CM

## 2014-07-19 LAB — CBC AND DIFFERENTIAL
HCT: 101 % — AB (ref 41–53)
Hemoglobin: 9.9 g/dL — AB (ref 13.5–17.5)
Platelets: 194 10*3/uL (ref 150–399)
WBC: 8 10^3/mL

## 2014-07-23 ENCOUNTER — Other Ambulatory Visit: Payer: Self-pay | Admitting: Nurse Practitioner

## 2014-07-23 DIAGNOSIS — N189 Chronic kidney disease, unspecified: Principal | ICD-10-CM

## 2014-07-23 DIAGNOSIS — D631 Anemia in chronic kidney disease: Secondary | ICD-10-CM

## 2014-07-30 ENCOUNTER — Non-Acute Institutional Stay (SKILLED_NURSING_FACILITY): Payer: Medicare Other | Admitting: Nurse Practitioner

## 2014-07-30 DIAGNOSIS — K5900 Constipation, unspecified: Secondary | ICD-10-CM

## 2014-07-30 DIAGNOSIS — M159 Polyosteoarthritis, unspecified: Secondary | ICD-10-CM

## 2014-07-30 DIAGNOSIS — R5382 Chronic fatigue, unspecified: Secondary | ICD-10-CM | POA: Diagnosis not present

## 2014-07-30 DIAGNOSIS — I471 Supraventricular tachycardia: Secondary | ICD-10-CM

## 2014-07-30 DIAGNOSIS — E039 Hypothyroidism, unspecified: Secondary | ICD-10-CM | POA: Diagnosis not present

## 2014-07-30 DIAGNOSIS — N183 Chronic kidney disease, stage 3 unspecified: Secondary | ICD-10-CM

## 2014-07-30 DIAGNOSIS — D631 Anemia in chronic kidney disease: Secondary | ICD-10-CM

## 2014-07-30 DIAGNOSIS — R5381 Other malaise: Secondary | ICD-10-CM

## 2014-07-30 DIAGNOSIS — K219 Gastro-esophageal reflux disease without esophagitis: Secondary | ICD-10-CM | POA: Diagnosis not present

## 2014-07-30 DIAGNOSIS — M150 Primary generalized (osteo)arthritis: Secondary | ICD-10-CM

## 2014-07-30 DIAGNOSIS — E1122 Type 2 diabetes mellitus with diabetic chronic kidney disease: Secondary | ICD-10-CM | POA: Diagnosis not present

## 2014-07-30 DIAGNOSIS — R413 Other amnesia: Secondary | ICD-10-CM | POA: Diagnosis not present

## 2014-07-30 DIAGNOSIS — N189 Chronic kidney disease, unspecified: Secondary | ICD-10-CM

## 2014-07-30 DIAGNOSIS — N390 Urinary tract infection, site not specified: Secondary | ICD-10-CM | POA: Diagnosis not present

## 2014-07-30 NOTE — Assessment & Plan Note (Signed)
12/07/13 Hgb A1c 6.5. Continue Levemir 14 u qd, Novolog 8 u with lunch, 5 u with breakfast and dinner for CBG>100  07/05/14 Hgb a1c 6.7

## 2014-07-30 NOTE — Assessment & Plan Note (Signed)
Rate controlled. Takes Amiodarone 200mg  daily.

## 2014-07-30 NOTE — Assessment & Plan Note (Signed)
Bun/creat 55/2.48 05/10/13  Bun/creat  59/2.49 08/15/13.  Bun/creat 62/2.6 12/07/13 03/22/14 Bun/creat 72/2.69 07/05/14 Hgb a1c 6.7, creatinine 2.69

## 2014-07-30 NOTE — Assessment & Plan Note (Signed)
he patient stated he wakes up 2-3x/night and takes him a while to return to asleep and admitted he sleeps more than usual during day. Workup: CBC, CMP, TSH, Hgb A1c unremarkable except creat 2-3 at his baseline and UTI. May consider Mirtazapine 7.5mg  nightly if no better upon completion of ABT

## 2014-07-30 NOTE — Assessment & Plan Note (Signed)
Family very aware of changes. SNF for care needs.

## 2014-07-30 NOTE — Progress Notes (Signed)
Patient ID: [name] [name], male   DOB: 02-21-1921, 81 y.o.   MRN: [id number]   Code Status: DNR  Allergies  Allergen Reactions  . Celebrex [Celecoxib]     Chief Complaint  Patient presents with  . Medical Management of Chronic Issues  . Acute Visit    UTI    HPI: Patient is a 81 y.o. male seen in the SNF at [facility] today for evaluation of UTI and chronic medical conditions.  Problem List Items Addressed This Visit    Type II diabetes mellitus with renal manifestations    12/07/13 Hgb A1c 6.5. Continue Levemir 14 u qd, Novolog 8 u with lunch, 5 u with breakfast and dinner for CBG>100  07/05/14 Hgb a1c 6.7       Paroxysmal supraventricular tachycardia    Rate controlled. Takes Amiodarone 200mg  daily.         Osteoarthritis, multiple sites    11/18//15 c/o lower back R+L SIJs and legs, aches in nature, comes and goes. Better managed with Tylenol 500mg  bid        Memory loss    Family very aware of changes. SNF for care needs.        Infection of urinary tract - Primary    07/28/14 urine culture: Viridans streptococcus-Augment 875mg  bid x 7days.        Hypothyroidism    TSH 5.151 09/12/13, started Levothyroxine 60mcg 09/13/13 12/07/13 TSH 3.062 03/22/14 TSH 4.433 07/05/14 TSH 4.297       GERD (gastroesophageal reflux disease)    Stable. Continue Omeprazole       Constipation    Stable, takes Colace 100mg  bid        Chronic renal disease, stage III (Chronic)    Bun/creat 55/2.48 05/10/13  Bun/creat  59/2.49 08/15/13.  Bun/creat 62/2.6 12/07/13 03/22/14 Bun/creat 72/2.69 07/05/14 Hgb a1c 6.7, creatinine 2.69          Chronic fatigue and malaise    he patient stated he wakes up 2-3x/night and takes him a while to return to asleep and admitted he sleeps more than usual during day. Workup: CBC, CMP, TSH, Hgb A1c unremarkable except creat 2-3 at his baseline and UTI. May consider Mirtazapine 7.5mg  nightly if no better upon completion of ABT       Anemia    Chronic, Hgb 9.9 07/19/14         Review of Systems:  Review of Systems  Constitutional: Positive for malaise/fatigue. Negative for fever, chills, weight loss and diaphoresis.  HENT: Positive for hearing loss. Negative for congestion, ear discharge, ear pain, nosebleeds, sore throat and tinnitus.   Eyes: Negative for blurred vision, double vision, photophobia, pain, discharge and redness.  Respiratory: Negative for cough, hemoptysis, sputum production, shortness of breath, wheezing and stridor.   Cardiovascular: Negative for chest pain, palpitations, orthopnea, claudication, leg swelling and PND.  Gastrointestinal: Negative for heartburn, nausea, vomiting, abdominal pain, diarrhea, constipation and blood in stool.  Genitourinary: Positive for frequency. Negative for dysuria, urgency, hematuria and flank pain.  Musculoskeletal: Positive for joint pain. Negative for myalgias, back pain, falls and neck pain.  Skin: Negative for itching and rash.  Neurological: Negative for dizziness, tingling, tremors, sensory change, speech change, focal weakness, seizures, loss of consciousness, weakness and headaches.  Endo/Heme/Allergies: Negative for environmental allergies and polydipsia. Does not bruise/bleed easily.  Psychiatric/Behavioral: Positive for depression and memory loss. Negative for suicidal ideas, hallucinations and substance abuse. The patient is not nervous/anxious and does not have insomnia.  Past Medical History  Diagnosis Date  . CVA (cerebral infarction) 1981    MI  . Type II or unspecified type diabetes mellitus without mention of complication, not stated as uncontrolled   . Diverticulosis   . History of GI diverticular bleed 2007  . HTN (hypertension)   . AAA (abdominal aortic aneurysm)     CT 2006 3.2 cm  . Bladder cancer     Transitional cell carcinoma excised 2006  . Renal malignant tumor     transitional cell carcinoma lasered 2006  . Hyperlipidemia   .  Hydrocele, left   . Inguinal hernia   . Coronary artery disease 05/05/2011    Echo EF 50%- 55% mild inferiof wall hypokinesis,mild dilatation left artium,mild mitral annular calcif. mild MR, mild TR, and mild calcif. /sclerosis trileaflet aortic valve w/ trace aortic insufficiency  . Dysrhythmia     af  . Anemia   . GERD (gastroesophageal reflux disease)   . Arthritis   . Myocardial infarction 1981    inferior-wall MI   . Atrial fibrillation 07/04/2010    cardioversion - successful  normal sinus rhythm; May 2012 discontinue Coumadin  and increaseAspirin  . Peripheral vascular disease 11/13/2010    Abd aortic doppler mild abd aortic aneurysm 3.1 x 2.9cm  . Coronary artery disease 10/01/2009    Persantaine myview-EF 53% moderate perfusion d/t infaract/scar w/mild perinfaract ischemia seen Mid Anterior ,Mid Inferior, Apical Inferior , Basal and Mid inferolateral, Apical Lateral region  . Hypopotassemia 06/22/2012  . Edema 06/22/2012  . Congestive heart failure, unspecified 06/13/2012  . Olecranon bursitis 02/03/2012  . Pneumonia, organism unspecified 01/20/2012  . Neoplasm of uncertain behavior of skin 10/19/2011  . Unspecified venous (peripheral) insufficiency 08/26/2011  . Unspecified constipation 07/29/2011  . Lumbago 07/09/2011  . Personal history of fall 06/25/2011  . Unspecified malignant neoplasm of scalp and skin of neck 06/11/2011  . Other acquired deformity of ankle and foot(736.79) 06/11/2011  . Other alteration of consciousness 06/11/2011  . Hyposmolality and/or hyponatremia 05/07/2011  . Major depressive disorder, single episode, unspecified 05/07/2011  . Abnormality of gait 04/27/2011  . Other B-complex deficiencies 04/09/2011  . Senile dementia, uncomplicated 87/86/7672  . Phlebitis and thrombophlebitis of other deep vessels of lower extremities 04/09/2011  . Hemorrhage of rectum and anus 04/09/2011  . Loss of weight 04/09/2011  . Anemia, unspecified 9/8/209  .  Malignant neoplasm of bladder, part unspecified 04/08/2008  . Malignant neoplasm of kidney, except pelvis 04/08/2005  . Venous stasis dermatitis 03/15/14   Past Surgical History  Procedure Laterality Date  . Coronary artery bypass graft  '94    LIMA-LAD, SVG-OM, PD, RCA  . Cataract extraction  2007  . Renal laser ablation of tumor  2006  . Coronary artery bypass graft  1994    LIMA to LAD, vein to the marginal, vein to the PDA, vein to the PLA   . Appendectomy    . Eye surgery     Social History:   reports that he has quit smoking. He does not have any smokeless tobacco history on file. He reports that he does not drink alcohol or use illicit drugs.  Family History  Problem Relation Age of Onset  . Diabetes Mother   . Diabetes Daughter     Medications: Patient's Medications  New Prescriptions   No medications on file  Previous Medications   ACETAMINOPHEN (TYLENOL) 500 MG TABLET    Take 500 mg by mouth 2 (two) times daily.  AMIODARONE (PACERONE) 200 MG TABLET    Take 200 mg by mouth daily. Take one tablet daily for heart.   ASPIRIN 81 MG TABLET    Take 81 mg by mouth daily.   ATORVASTATIN (LIPITOR) 10 MG TABLET    Take 10 mg by mouth every Monday, Wednesday, and Friday.    DOCUSATE SODIUM (COLACE) 100 MG CAPSULE    Take 100 mg by mouth 2 (two) times daily.   EYELID CLEANSERS (OCUSOFT LID SCRUB PLUS) PADS    Apply topically 2 (two) times daily. Use to eyes twice daily in morning and evening.   INSULIN ASPART (NOVOLOG) 100 UNIT/ML INJECTION    Inject 5-8 Units into the skin 3 (three) times daily before meals. 5units with breakfast and dinner, 8 units with lunch for CBG>150   INSULIN DETEMIR (LEVEMIR FLEXPEN) 100 UNIT/ML INJECTION    Inject 14 Units into the skin at bedtime.    LEVOTHYROXINE (SYNTHROID, LEVOTHROID) 25 MCG TABLET    Take 25 mcg by mouth daily before breakfast.   NUTRITIONAL SUPPLEMENTS (BOOST DIABETIC) LIQD    Take by mouth. Drink 1 can daily.   OMEPRAZOLE (PRILOSEC)  20 MG CAPSULE    Take 20 mg by mouth daily.     TRIAMCINOLONE (KENALOG) 0.025 % OINTMENT    Apply to rough skin on legs daily for 7 days, then stop   VITAMIN B-12 (CYANOCOBALAMIN) 1000 MCG TABLET    Take 1,000 mcg by mouth daily.    Modified Medications   No medications on file  Discontinued Medications   No medications on file     Physical Exam: Physical Exam  Constitutional: He is oriented to person, place, and time. He appears well-developed and well-nourished.  HENT:  Head: Normocephalic and atraumatic.  Eyes: Conjunctivae and EOM are normal. Pupils are equal, round, and reactive to light.  Neck: Normal range of motion. Neck supple. No JVD present. No thyromegaly present.  Cardiovascular: Normal rate.  An irregular rhythm present.  Murmur heard.  Systolic murmur is present with a grade of 2/6  Pulmonary/Chest: Effort normal. He has no wheezes. He has rales (bibailar dry rales). He exhibits no tenderness.  Abdominal: Soft. Bowel sounds are normal. There is no tenderness.  Musculoskeletal: Normal range of motion. He exhibits no edema or tenderness.  Lymphadenopathy:    He has no cervical adenopathy.  Neurological: He is alert and oriented to person, place, and time. He has normal reflexes. No cranial nerve deficit. He exhibits normal muscle tone. Coordination normal.  Skin: Skin is warm and dry. No rash noted. No erythema.     Psychiatric: He has a normal mood and affect. His behavior is normal. Judgment and thought content normal.    Filed Vitals:   07/30/14 0947  BP: 130/60  Pulse: 70  Temp: 97.6 F (36.4 C)  TempSrc: Tympanic  Resp: 18   Labs reviewed: Basic Metabolic Panel:  Recent Labs  12/07/13 03/22/14 07/05/14  NA 136* 138 137  K 4.4 4.4 4.6  BUN 62* 72* 91*  CREATININE 2.6* 2.7* 2.7*  TSH 3.06 4.43 4.30   Liver Function Tests:  Recent Labs  08/15/13 07/05/14  AST 15 19  ALT 13 18  ALKPHOS 87 94   CBC:  Recent Labs  03/22/14 07/05/14 07/19/14    WBC 7.1 6.2 8.0  HGB 9.9* 8.8* 9.9*  HCT 30* 26* 101*  PLT 178 210 194   Lipid Panel: No results for input(s): CHOL, HDL, LDLCALC, TRIG, CHOLHDL, LDLDIRECT in  the last 8760 hours.  Past Procedures:  08/09/13 CXR minimal cardiomegaly appears new without pulmonary vascular congestion or pleural effusion, mild elevation left hemidiaphragm unchanged, no inflammatory consolidate or suspicious nodule, mild atelectasis right lung base and left perihilar region.   Assessment/Plan Infection of urinary tract 07/28/14 urine culture: Viridans streptococcus-Augment 875mg  bid x 7days.     Chronic renal disease, stage III Bun/creat 55/2.48 05/10/13  Bun/creat  59/2.49 08/15/13.  Bun/creat 62/2.6 12/07/13 03/22/14 Bun/creat 72/2.69 07/05/14 Hgb a1c 6.7, creatinine 2.69       Anemia Chronic, Hgb 9.9 07/19/14   Memory loss Family very aware of changes. SNF for care needs.     GERD (gastroesophageal reflux disease) Stable. Continue Omeprazole    Paroxysmal supraventricular tachycardia Rate controlled. Takes Amiodarone 200mg  daily.      Type II diabetes mellitus with renal manifestations 12/07/13 Hgb A1c 6.5. Continue Levemir 14 u qd, Novolog 8 u with lunch, 5 u with breakfast and dinner for CBG>100  07/05/14 Hgb a1c 6.7    Constipation Stable, takes Colace 100mg  bid     Hypothyroidism TSH 5.151 09/12/13, started Levothyroxine 75mcg 09/13/13 12/07/13 TSH 3.062 03/22/14 TSH 4.433 07/05/14 TSH 4.297    Osteoarthritis, multiple sites 11/18//15 c/o lower back R+L SIJs and legs, aches in nature, comes and goes. Better managed with Tylenol 500mg  bid     Chronic fatigue and malaise he patient stated he wakes up 2-3x/night and takes him a while to return to asleep and admitted he sleeps more than usual during day. Workup: CBC, CMP, TSH, Hgb A1c unremarkable except creat 2-3 at his baseline and UTI. May consider Mirtazapine 7.5mg  nightly if no better upon completion of  ABT      Family/ Staff Communication: observe the patient  Goals of Care: SNF  Labs/tests ordered: none

## 2014-07-30 NOTE — Assessment & Plan Note (Signed)
11/18//15 c/o lower back R+L SIJs and legs, aches in nature, comes and goes. Better managed with Tylenol 500mg  bid

## 2014-07-30 NOTE — Assessment & Plan Note (Signed)
TSH 5.151 09/12/13, started Levothyroxine 75mcg 09/13/13 12/07/13 TSH 3.062 03/22/14 TSH 4.433 07/05/14 TSH 4.297

## 2014-07-30 NOTE — Assessment & Plan Note (Signed)
Stable.  Continue Omeprazole.

## 2014-07-30 NOTE — Assessment & Plan Note (Signed)
07/28/14 urine culture: Viridans streptococcus-Augment 875mg  bid x 7days.

## 2014-07-30 NOTE — Assessment & Plan Note (Signed)
Chronic, Hgb 9.9 07/19/14

## 2014-07-30 NOTE — Assessment & Plan Note (Signed)
Stable, takes Colace 100mg  bid

## 2014-08-22 ENCOUNTER — Encounter: Payer: Self-pay | Admitting: Nurse Practitioner

## 2014-08-22 ENCOUNTER — Non-Acute Institutional Stay (SKILLED_NURSING_FACILITY): Payer: Medicare Other | Admitting: Nurse Practitioner

## 2014-08-22 DIAGNOSIS — I2510 Atherosclerotic heart disease of native coronary artery without angina pectoris: Secondary | ICD-10-CM

## 2014-08-22 DIAGNOSIS — K5900 Constipation, unspecified: Secondary | ICD-10-CM

## 2014-08-22 DIAGNOSIS — M150 Primary generalized (osteo)arthritis: Secondary | ICD-10-CM | POA: Diagnosis not present

## 2014-08-22 DIAGNOSIS — N390 Urinary tract infection, site not specified: Secondary | ICD-10-CM | POA: Diagnosis not present

## 2014-08-22 DIAGNOSIS — R5382 Chronic fatigue, unspecified: Secondary | ICD-10-CM

## 2014-08-22 DIAGNOSIS — N189 Chronic kidney disease, unspecified: Secondary | ICD-10-CM

## 2014-08-22 DIAGNOSIS — E538 Deficiency of other specified B group vitamins: Secondary | ICD-10-CM | POA: Diagnosis not present

## 2014-08-22 DIAGNOSIS — E1122 Type 2 diabetes mellitus with diabetic chronic kidney disease: Secondary | ICD-10-CM

## 2014-08-22 DIAGNOSIS — R413 Other amnesia: Secondary | ICD-10-CM | POA: Diagnosis not present

## 2014-08-22 DIAGNOSIS — R5381 Other malaise: Secondary | ICD-10-CM

## 2014-08-22 DIAGNOSIS — K219 Gastro-esophageal reflux disease without esophagitis: Secondary | ICD-10-CM | POA: Diagnosis not present

## 2014-08-22 DIAGNOSIS — I471 Supraventricular tachycardia: Secondary | ICD-10-CM

## 2014-08-22 DIAGNOSIS — N183 Chronic kidney disease, stage 3 unspecified: Secondary | ICD-10-CM

## 2014-08-22 DIAGNOSIS — M159 Polyosteoarthritis, unspecified: Secondary | ICD-10-CM

## 2014-08-22 DIAGNOSIS — H01003 Unspecified blepharitis right eye, unspecified eyelid: Secondary | ICD-10-CM | POA: Insufficient documentation

## 2014-08-22 DIAGNOSIS — E039 Hypothyroidism, unspecified: Secondary | ICD-10-CM | POA: Diagnosis not present

## 2014-08-22 DIAGNOSIS — D631 Anemia in chronic kidney disease: Secondary | ICD-10-CM

## 2014-08-22 DIAGNOSIS — H01006 Unspecified blepharitis left eye, unspecified eyelid: Secondary | ICD-10-CM

## 2014-08-22 NOTE — Assessment & Plan Note (Signed)
Continue risk reduction: ASA and Atorvastatin.

## 2014-08-22 NOTE — Assessment & Plan Note (Signed)
Chronic, Hgb 9.9 07/19/14

## 2014-08-22 NOTE — Assessment & Plan Note (Signed)
Observe for s/s of UTI, otherwise multiple factorials.

## 2014-08-22 NOTE — Assessment & Plan Note (Signed)
Takes Vit B12 1034mcg po daily.

## 2014-08-22 NOTE — Assessment & Plan Note (Signed)
07/28/14 urine culture: Viridans streptococcus-Augment 875mg  bid x 7days.

## 2014-08-22 NOTE — Assessment & Plan Note (Signed)
Stable.  Continue Omeprazole.

## 2014-08-22 NOTE — Assessment & Plan Note (Signed)
R+L--baby shampoo to scrub eyelashes qam.

## 2014-08-22 NOTE — Assessment & Plan Note (Signed)
TSH 5.151 09/12/13, started Levothyroxine 55mcg 09/13/13 12/07/13 TSH 3.062 03/22/14 TSH 4.433 07/05/14 TSH 4.297

## 2014-08-22 NOTE — Assessment & Plan Note (Signed)
03/22/14 Bun/creat 72/2.69 07/05/14 Hgb a1c 6.7, creatinine 2.69

## 2014-08-22 NOTE — Assessment & Plan Note (Signed)
08/13/14 leg pain-adding Tylenol prn in addition to Tylenol 500mg  bid.

## 2014-08-22 NOTE — Progress Notes (Signed)
Patient ID: [name] [name], male   DOB: [birth year], 81 y.o.   MRN: [id number]   Code Status: DNR  Allergies  Allergen Reactions  . Celebrex [Celecoxib]     Chief Complaint  Patient presents with  . Medical Management of Chronic Issues    HPI: Patient is a 81 y.o. male seen in the SNF at [facility] today for evaluation of chronic medical conditions.  Problem List Items Addressed This Visit    Chronic renal disease, stage III (Chronic)    03/22/14 Bun/creat 72/2.69 07/05/14 Hgb a1c 6.7, creatinine 2.69        Coronary atherosclerosis    Continue risk reduction: ASA and Atorvastatin.        Anemia    Chronic, Hgb 9.9 07/19/14       Memory loss - Primary    Family is very aware of changes. SNF for care needs. May update CBC, CMP, UA c/s as indicated.         Vitamin B 12 deficiency    Takes Vit B12 1068mcg po daily.      GERD (gastroesophageal reflux disease)    Stable. Continue Omeprazole        Paroxysmal supraventricular tachycardia    Rate controlled. Takes Amiodarone 200mg  daily.         Infection of urinary tract    07/28/14 urine culture: Viridans streptococcus-Augment 875mg  bid x 7days.        Type II diabetes mellitus with renal manifestations    12/07/13 Hgb A1c 6.5. Continue Levemir 14 u qd, Novolog 8 u with lunch, 5 u with breakfast and dinner for CBG>100  07/05/14 Hgb a1c 6.7        Constipation    Stable, takes Colace 100mg  bid       Hypothyroidism    TSH 5.151 09/12/13, started Levothyroxine 41mcg 09/13/13 12/07/13 TSH 3.062 03/22/14 TSH 4.433 07/05/14 TSH 4.297      Osteoarthritis, multiple sites    08/13/14 leg pain-adding Tylenol prn in addition to Tylenol 500mg  bid.        Chronic fatigue and malaise    Observe for s/s of UTI, otherwise multiple factorials.       Blepharitis of both eyes    R+L--baby shampoo to scrub eyelashes qam.          Review of Systems:  Review of Systems  Constitutional: Negative for  fever, chills and diaphoresis.  HENT: Positive for hearing loss. Negative for congestion, ear discharge, ear pain, nosebleeds, sore throat and tinnitus.   Eyes: Negative for photophobia, pain, discharge and redness.  Respiratory: Negative for cough, shortness of breath, wheezing and stridor.   Cardiovascular: Negative for chest pain, palpitations and leg swelling.  Gastrointestinal: Negative for nausea, vomiting, abdominal pain, diarrhea, constipation and blood in stool.  Endocrine: Negative for polydipsia.  Genitourinary: Positive for frequency. Negative for dysuria, urgency, hematuria and flank pain.  Musculoskeletal: Negative for myalgias, back pain and neck pain.  Skin: Negative for rash.  Allergic/Immunologic: Negative for environmental allergies.  Neurological: Negative for dizziness, tremors, seizures, weakness and headaches.  Hematological: Does not bruise/bleed easily.  Psychiatric/Behavioral: Negative for suicidal ideas and hallucinations. The patient is not nervous/anxious.      Past Medical History  Diagnosis Date  . CVA (cerebral infarction) 1981    MI  . Type II or unspecified type diabetes mellitus without mention of complication, not stated as uncontrolled   . Diverticulosis   . History of GI diverticular bleed 2007  .  HTN (hypertension)   . AAA (abdominal aortic aneurysm)     CT 2006 3.2 cm  . Bladder cancer     Transitional cell carcinoma excised 2006  . Renal malignant tumor     transitional cell carcinoma lasered 2006  . Hyperlipidemia   . Hydrocele, left   . Inguinal hernia   . Coronary artery disease 05/05/2011    Echo EF 50%- 55% mild inferiof wall hypokinesis,mild dilatation left artium,mild mitral annular calcif. mild MR, mild TR, and mild calcif. /sclerosis trileaflet aortic valve w/ trace aortic insufficiency  . Dysrhythmia     af  . Anemia   . GERD (gastroesophageal reflux disease)   . Arthritis   . Myocardial infarction 1981    inferior-wall MI     . Atrial fibrillation 07/04/2010    cardioversion - successful  normal sinus rhythm; May 2012 discontinue Coumadin  and increaseAspirin  . Peripheral vascular disease 11/13/2010    Abd aortic doppler mild abd aortic aneurysm 3.1 x 2.9cm  . Coronary artery disease 10/01/2009    Persantaine myview-EF 53% moderate perfusion d/t infaract/scar w/mild perinfaract ischemia seen Mid Anterior ,Mid Inferior, Apical Inferior , Basal and Mid inferolateral, Apical Lateral region  . Hypopotassemia 06/22/2012  . Edema 06/22/2012  . Congestive heart failure, unspecified 06/13/2012  . Olecranon bursitis 02/03/2012  . Pneumonia, organism unspecified 01/20/2012  . Neoplasm of uncertain behavior of skin 10/19/2011  . Unspecified venous (peripheral) insufficiency 08/26/2011  . Unspecified constipation 07/29/2011  . Lumbago 07/09/2011  . Personal history of fall 06/25/2011  . Unspecified malignant neoplasm of scalp and skin of neck 06/11/2011  . Other acquired deformity of ankle and foot(736.79) 06/11/2011  . Other alteration of consciousness 06/11/2011  . Hyposmolality and/or hyponatremia 05/07/2011  . Major depressive disorder, single episode, unspecified 05/07/2011  . Abnormality of gait 04/27/2011  . Other B-complex deficiencies 04/09/2011  . Senile dementia, uncomplicated 41/93/7902  . Phlebitis and thrombophlebitis of other deep vessels of lower extremities 04/09/2011  . Hemorrhage of rectum and anus 04/09/2011  . Loss of weight 04/09/2011  . Anemia, unspecified 9/8/209  . Malignant neoplasm of bladder, part unspecified 04/08/2008  . Malignant neoplasm of kidney, except pelvis 04/08/2005  . Venous stasis dermatitis 03/15/14   Past Surgical History  Procedure Laterality Date  . Coronary artery bypass graft  '94    LIMA-LAD, SVG-OM, PD, RCA  . Cataract extraction  2007  . Renal laser ablation of tumor  2006  . Coronary artery bypass graft  1994    LIMA to LAD, vein to the marginal, vein to the  PDA, vein to the PLA   . Appendectomy    . Eye surgery     Social History:   reports that he has quit smoking. He does not have any smokeless tobacco history on file. He reports that he does not drink alcohol or use illicit drugs.  Family History  Problem Relation Age of Onset  . Diabetes Mother   . Diabetes Daughter     Medications: Patient's Medications  New Prescriptions   No medications on file  Previous Medications   ACETAMINOPHEN (TYLENOL) 500 MG TABLET    Take 500 mg by mouth 2 (two) times daily.   AMIODARONE (PACERONE) 200 MG TABLET    Take 200 mg by mouth daily. Take one tablet daily for heart.   ASPIRIN 81 MG TABLET    Take 81 mg by mouth daily.   ATORVASTATIN (LIPITOR) 10 MG TABLET    Take 10  mg by mouth every Monday, Wednesday, and Friday.    DOCUSATE SODIUM (COLACE) 100 MG CAPSULE    Take 100 mg by mouth 2 (two) times daily.   EYELID CLEANSERS (OCUSOFT LID SCRUB PLUS) PADS    Apply topically 2 (two) times daily. Use to eyes twice daily in morning and evening.   INSULIN ASPART (NOVOLOG) 100 UNIT/ML INJECTION    Inject 5-8 Units into the skin 3 (three) times daily before meals. 5units with breakfast and dinner, 8 units with lunch for CBG>150   INSULIN DETEMIR (LEVEMIR FLEXPEN) 100 UNIT/ML INJECTION    Inject 14 Units into the skin at bedtime.    LEVOTHYROXINE (SYNTHROID, LEVOTHROID) 25 MCG TABLET    Take 25 mcg by mouth daily before breakfast.   NUTRITIONAL SUPPLEMENTS (BOOST DIABETIC) LIQD    Take by mouth. Drink 1 can daily.   OMEPRAZOLE (PRILOSEC) 20 MG CAPSULE    Take 20 mg by mouth daily.     TRIAMCINOLONE (KENALOG) 0.025 % OINTMENT    Apply to rough skin on legs daily for 7 days, then stop   VITAMIN B-12 (CYANOCOBALAMIN) 1000 MCG TABLET    Take 1,000 mcg by mouth daily.    Modified Medications   No medications on file  Discontinued Medications   No medications on file     Physical Exam: Physical Exam  Constitutional: He is oriented to person, place, and time.  He appears well-developed and well-nourished.  HENT:  Head: Normocephalic and atraumatic.  Eyes: Conjunctivae and EOM are normal. Pupils are equal, round, and reactive to light.  Crusty eyelashes.   Neck: Normal range of motion. Neck supple. No JVD present. No thyromegaly present.  Cardiovascular: Normal rate.  An irregular rhythm present.  Murmur heard.  Systolic murmur is present with a grade of 2/6  Pulmonary/Chest: Effort normal. He has no wheezes. He has rales (bibailar dry rales). He exhibits no tenderness.  Abdominal: Soft. Bowel sounds are normal. There is no tenderness.  Musculoskeletal: Normal range of motion. He exhibits no edema or tenderness.  Lymphadenopathy:    He has no cervical adenopathy.  Neurological: He is alert and oriented to person, place, and time. He has normal reflexes. No cranial nerve deficit. He exhibits normal muscle tone. Coordination normal.  Skin: Skin is warm and dry. No rash noted. No erythema.     Psychiatric: He has a normal mood and affect. His behavior is normal. Judgment and thought content normal.    Filed Vitals:   08/22/14 1424  BP: 124/75  Pulse: 72  Temp: 97.8 F (36.6 C)  TempSrc: Tympanic  Resp: 16      Labs reviewed: Basic Metabolic Panel:  Recent Labs  12/07/13 03/22/14 07/05/14  NA 136* 138 137  K 4.4 4.4 4.6  BUN 62* 72* 91*  CREATININE 2.6* 2.7* 2.7*  TSH 3.06 4.43 4.30   Liver Function Tests:  Recent Labs  07/05/14  AST 19  ALT 18  ALKPHOS 94   No results for input(s): LIPASE, AMYLASE in the last 8760 hours. No results for input(s): AMMONIA in the last 8760 hours. CBC:  Recent Labs  03/22/14 07/05/14 07/19/14  WBC 7.1 6.2 8.0  HGB 9.9* 8.8* 9.9*  HCT 30* 26* 101*  PLT 178 210 194   Lipid Panel: No results for input(s): CHOL, HDL, LDLCALC, TRIG, CHOLHDL, LDLDIRECT in the last 8760 hours.  Past Procedures:  08/09/13 CXR minimal cardiomegaly appears new without pulmonary vascular congestion or  pleural effusion, mild elevation left  hemidiaphragm unchanged, no inflammatory consolidate or suspicious nodule, mild atelectasis right lung base and left perihilar region.  Assessment/Plan Chronic renal disease, stage III 03/22/14 Bun/creat 72/2.69 07/05/14 Hgb a1c 6.7, creatinine 2.69     Coronary atherosclerosis Continue risk reduction: ASA and Atorvastatin.     Anemia Chronic, Hgb 9.9 07/19/14    Memory loss Family is very aware of changes. SNF for care needs. May update CBC, CMP, UA c/s as indicated.      Vitamin B 12 deficiency Takes Vit B12 109mcg po daily.   GERD (gastroesophageal reflux disease) Stable. Continue Omeprazole     Paroxysmal supraventricular tachycardia Rate controlled. Takes Amiodarone 200mg  daily.      Infection of urinary tract 07/28/14 urine culture: Viridans streptococcus-Augment 875mg  bid x 7days.     Type II diabetes mellitus with renal manifestations 12/07/13 Hgb A1c 6.5. Continue Levemir 14 u qd, Novolog 8 u with lunch, 5 u with breakfast and dinner for CBG>100  07/05/14 Hgb a1c 6.7     Constipation Stable, takes Colace 100mg  bid    Hypothyroidism TSH 5.151 09/12/13, started Levothyroxine 46mcg 09/13/13 12/07/13 TSH 3.062 03/22/14 TSH 4.433 07/05/14 TSH 4.297   Osteoarthritis, multiple sites 08/13/14 leg pain-adding Tylenol prn in addition to Tylenol 500mg  bid.     Chronic fatigue and malaise Observe for s/s of UTI, otherwise multiple factorials.    Blepharitis of both eyes R+L--baby shampoo to scrub eyelashes qam.      Family/ Staff Communication: observe the patient  Goals of Care: SNF  Labs/tests ordered: CBC, CMP, UA C/S

## 2014-08-22 NOTE — Assessment & Plan Note (Signed)
Stable, takes Colace 100mg  bid

## 2014-08-22 NOTE — Assessment & Plan Note (Signed)
Rate controlled. Takes Amiodarone 200mg  daily.

## 2014-08-22 NOTE — Assessment & Plan Note (Signed)
12/07/13 Hgb A1c 6.5. Continue Levemir 14 u qd, Novolog 8 u with lunch, 5 u with breakfast and dinner for CBG>100  07/05/14 Hgb a1c 6.7

## 2014-08-22 NOTE — Assessment & Plan Note (Signed)
Family is very aware of changes. SNF for care needs. May update CBC, CMP, UA c/s as indicated.

## 2014-09-10 ENCOUNTER — Encounter: Payer: Self-pay | Admitting: Nurse Practitioner

## 2014-09-10 ENCOUNTER — Non-Acute Institutional Stay (SKILLED_NURSING_FACILITY): Payer: Medicare Other | Admitting: Nurse Practitioner

## 2014-09-10 DIAGNOSIS — H01003 Unspecified blepharitis right eye, unspecified eyelid: Secondary | ICD-10-CM | POA: Diagnosis not present

## 2014-09-10 DIAGNOSIS — I2510 Atherosclerotic heart disease of native coronary artery without angina pectoris: Secondary | ICD-10-CM

## 2014-09-10 DIAGNOSIS — M150 Primary generalized (osteo)arthritis: Secondary | ICD-10-CM | POA: Diagnosis not present

## 2014-09-10 DIAGNOSIS — R5381 Other malaise: Secondary | ICD-10-CM

## 2014-09-10 DIAGNOSIS — N183 Chronic kidney disease, stage 3 unspecified: Secondary | ICD-10-CM

## 2014-09-10 DIAGNOSIS — E039 Hypothyroidism, unspecified: Secondary | ICD-10-CM | POA: Diagnosis not present

## 2014-09-10 DIAGNOSIS — N189 Chronic kidney disease, unspecified: Secondary | ICD-10-CM | POA: Diagnosis not present

## 2014-09-10 DIAGNOSIS — H01006 Unspecified blepharitis left eye, unspecified eyelid: Secondary | ICD-10-CM

## 2014-09-10 DIAGNOSIS — D631 Anemia in chronic kidney disease: Secondary | ICD-10-CM

## 2014-09-10 DIAGNOSIS — R413 Other amnesia: Secondary | ICD-10-CM

## 2014-09-10 DIAGNOSIS — M159 Polyosteoarthritis, unspecified: Secondary | ICD-10-CM

## 2014-09-10 DIAGNOSIS — E1122 Type 2 diabetes mellitus with diabetic chronic kidney disease: Secondary | ICD-10-CM

## 2014-09-10 DIAGNOSIS — K5900 Constipation, unspecified: Secondary | ICD-10-CM

## 2014-09-10 DIAGNOSIS — I471 Supraventricular tachycardia: Secondary | ICD-10-CM

## 2014-09-10 DIAGNOSIS — K219 Gastro-esophageal reflux disease without esophagitis: Secondary | ICD-10-CM

## 2014-09-10 DIAGNOSIS — R5382 Chronic fatigue, unspecified: Secondary | ICD-10-CM

## 2014-09-10 DIAGNOSIS — I10 Essential (primary) hypertension: Secondary | ICD-10-CM

## 2014-09-10 NOTE — Assessment & Plan Note (Signed)
Rapidly decline with focal neurological deficits-multifactorials-comfort measures with Morphine and Lorazepam prn for now.

## 2014-09-10 NOTE — Assessment & Plan Note (Signed)
Worsening-comfort measures.

## 2014-09-10 NOTE — Assessment & Plan Note (Signed)
03/22/14 Bun/creat 72/2.69 07/05/14 Hgb a1c 6.7, creatinine 2.69

## 2014-09-10 NOTE — Assessment & Plan Note (Signed)
Standing order available to him

## 2014-09-10 NOTE — Assessment & Plan Note (Signed)
Low measurements

## 2014-09-10 NOTE — Assessment & Plan Note (Signed)
Dc Levothyroxine-no longer beneficial in his plan of care .

## 2014-09-10 NOTE — Assessment & Plan Note (Signed)
Dc Tylenol. Prn Morphine available to him

## 2014-09-10 NOTE — Assessment & Plan Note (Signed)
Dc Omeprazole-he is asymptomatic of GERD

## 2014-09-10 NOTE — Progress Notes (Signed)
Patient ID: DOMENIK TRICE, male   DOB: [birth year], 81 y.o.   MRN: [id number]   Code Status: DNR  Allergies  Allergen Reactions  . Celebrex [Celecoxib]     Chief Complaint  Patient presents with  . Medical Management of Chronic Issues  . Acute Visit    end of life care    HPI: Patient is a 81 y.o. male seen in the SNF at [facility] today for evaluation of chronic medical conditions.  Problem List Items Addressed This Visit    Anemia - Primary    Dc Vit B12 1040mcg po daily-plan of care is comfort measures only. 07/19/14 Hgb 9.9       Blepharitis of both eyes    Stable.       Chronic fatigue and malaise    Worsening-comfort measures.      Chronic renal disease, stage III (Chronic)    03/22/14 Bun/creat 72/2.69 07/05/14 Hgb a1c 6.7, creatinine 2.69       Constipation    Standing order available to him       Coronary atherosclerosis    Dc ASA and Lipitor-comfort measures only.       Essential hypertension    Low measurements      GERD (gastroesophageal reflux disease)    Dc Omeprazole-he is asymptomatic of GERD      Hypothyroidism    Dc Levothyroxine-no longer beneficial in his plan of care .      Memory loss    Rapidly decline with focal neurological deficits-multifactorials-comfort measures with Morphine and Lorazepam prn for now.       Osteoarthritis, multiple sites    Dc Tylenol. Prn Morphine available to him      Relevant Medications   morphine (ROXANOL) 20 MG/ML concentrated solution   Paroxysmal supraventricular tachycardia    Dc Amiodarone-HR in 33s.       Type 2 diabetes mellitus with diabetic chronic kidney disease    Dc Insulin and CBG checks-comfort purpose.          Review of Systems:  Review of Systems  Constitutional: Positive for activity change, appetite change, fatigue and unexpected weight change. Negative for fever, chills and diaphoresis.  HENT: Positive for hearing loss. Negative for congestion, ear discharge, ear  pain, nosebleeds, sore throat and tinnitus.   Eyes: Negative for photophobia, pain, discharge and redness.  Respiratory: Negative for cough, shortness of breath, wheezing and stridor.   Cardiovascular: Negative for chest pain, palpitations and leg swelling.  Gastrointestinal: Negative for nausea, vomiting, abdominal pain, diarrhea, constipation and blood in stool.  Endocrine: Negative for polydipsia.  Genitourinary: Positive for frequency. Negative for dysuria, urgency, hematuria and flank pain.  Musculoskeletal: Negative for myalgias, back pain and neck pain.  Skin: Negative for rash.  Allergic/Immunologic: Negative for environmental allergies.  Neurological: Negative for dizziness, tremors, seizures, weakness and headaches.  Hematological: Does not bruise/bleed easily.  Psychiatric/Behavioral: Negative for suicidal ideas and hallucinations. The patient is not nervous/anxious.      Past Medical History  Diagnosis Date  . CVA (cerebral infarction) 1981    MI  . Type II or unspecified type diabetes mellitus without mention of complication, not stated as uncontrolled   . Diverticulosis   . History of GI diverticular bleed 2007  . HTN (hypertension)   . AAA (abdominal aortic aneurysm)     CT 2006 3.2 cm  . Bladder cancer     Transitional cell carcinoma excised 2006  . Renal malignant tumor  transitional cell carcinoma lasered 2006  . Hyperlipidemia   . Hydrocele, left   . Inguinal hernia   . Coronary artery disease 05/05/2011    Echo EF 50%- 55% mild inferiof wall hypokinesis,mild dilatation left artium,mild mitral annular calcif. mild MR, mild TR, and mild calcif. /sclerosis trileaflet aortic valve w/ trace aortic insufficiency  . Dysrhythmia     af  . Anemia   . GERD (gastroesophageal reflux disease)   . Arthritis   . Myocardial infarction 1981    inferior-wall MI   . Atrial fibrillation 07/04/2010    cardioversion - successful  normal sinus rhythm; May 2012 discontinue  Coumadin  and increaseAspirin  . Peripheral vascular disease 11/13/2010    Abd aortic doppler mild abd aortic aneurysm 3.1 x 2.9cm  . Coronary artery disease 10/01/2009    Persantaine myview-EF 53% moderate perfusion d/t infaract/scar w/mild perinfaract ischemia seen Mid Anterior ,Mid Inferior, Apical Inferior , Basal and Mid inferolateral, Apical Lateral region  . Hypopotassemia 06/22/2012  . Edema 06/22/2012  . Congestive heart failure, unspecified 06/13/2012  . Olecranon bursitis 02/03/2012  . Pneumonia, organism unspecified 01/20/2012  . Neoplasm of uncertain behavior of skin 10/19/2011  . Unspecified venous (peripheral) insufficiency 08/26/2011  . Unspecified constipation 07/29/2011  . Lumbago 07/09/2011  . Personal history of fall 06/25/2011  . Unspecified malignant neoplasm of scalp and skin of neck 06/11/2011  . Other acquired deformity of ankle and foot(736.79) 06/11/2011  . Other alteration of consciousness 06/11/2011  . Hyposmolality and/or hyponatremia 05/07/2011  . Major depressive disorder, single episode, unspecified 05/07/2011  . Abnormality of gait 04/27/2011  . Other B-complex deficiencies 04/09/2011  . Senile dementia, uncomplicated 10/62/6948  . Phlebitis and thrombophlebitis of other deep vessels of lower extremities 04/09/2011  . Hemorrhage of rectum and anus 04/09/2011  . Loss of weight 04/09/2011  . Anemia, unspecified 9/8/209  . Malignant neoplasm of bladder, part unspecified 04/08/2008  . Malignant neoplasm of kidney, except pelvis 04/08/2005  . Venous stasis dermatitis 03/15/14   Past Surgical History  Procedure Laterality Date  . Coronary artery bypass graft  '94    LIMA-LAD, SVG-OM, PD, RCA  . Cataract extraction  2007  . Renal laser ablation of tumor  2006  . Coronary artery bypass graft  1994    LIMA to LAD, vein to the marginal, vein to the PDA, vein to the PLA   . Appendectomy    . Eye surgery     Social History:   reports that he has quit  smoking. He does not have any smokeless tobacco history on file. He reports that he does not drink alcohol or use illicit drugs.  Family History  Problem Relation Age of Onset  . Diabetes Mother   . Diabetes Daughter     Medications: Patient's Medications  New Prescriptions   No medications on file  Previous Medications   LORAZEPAM (ATIVAN) 1 MG TABLET    Take 1 mg by mouth every 2 (two) hours as needed for anxiety.   MORPHINE (ROXANOL) 20 MG/ML CONCENTRATED SOLUTION    Take 5 mg by mouth every 2 (two) hours as needed for severe pain.  Modified Medications   No medications on file  Discontinued Medications   ACETAMINOPHEN (TYLENOL) 500 MG TABLET    Take 500 mg by mouth 2 (two) times daily.   AMIODARONE (PACERONE) 200 MG TABLET    Take 200 mg by mouth daily. Take one tablet daily for heart.   ASPIRIN 81 MG TABLET  Take 81 mg by mouth daily.   ATORVASTATIN (LIPITOR) 10 MG TABLET    Take 10 mg by mouth every Monday, Wednesday, and Friday.    DOCUSATE SODIUM (COLACE) 100 MG CAPSULE    Take 100 mg by mouth 2 (two) times daily.   EYELID CLEANSERS (OCUSOFT LID SCRUB PLUS) PADS    Apply topically 2 (two) times daily. Use to eyes twice daily in morning and evening.   INSULIN ASPART (NOVOLOG) 100 UNIT/ML INJECTION    Inject 5-8 Units into the skin 3 (three) times daily before meals. 5units with breakfast and dinner, 8 units with lunch for CBG>150   INSULIN DETEMIR (LEVEMIR FLEXPEN) 100 UNIT/ML INJECTION    Inject 14 Units into the skin at bedtime.    LEVOTHYROXINE (SYNTHROID, LEVOTHROID) 25 MCG TABLET    Take 25 mcg by mouth daily before breakfast.   NUTRITIONAL SUPPLEMENTS (BOOST DIABETIC) LIQD    Take by mouth. Drink 1 can daily.   OMEPRAZOLE (PRILOSEC) 20 MG CAPSULE    Take 20 mg by mouth daily.     TRIAMCINOLONE (KENALOG) 0.025 % OINTMENT    Apply to rough skin on legs daily for 7 days, then stop   VITAMIN B-12 (CYANOCOBALAMIN) 1000 MCG TABLET    Take 1,000 mcg by mouth daily.        Physical Exam: Physical Exam  Constitutional: He is oriented to person, place, and time. He appears well-developed and well-nourished.  HENT:  Head: Normocephalic and atraumatic.  Eyes: Conjunctivae and EOM are normal. Pupils are equal, round, and reactive to light.  Crusty eyelashes.   Neck: Normal range of motion. Neck supple. No JVD present. No thyromegaly present.  Cardiovascular: Normal rate.  An irregular rhythm present.  Murmur heard.  Systolic murmur is present with a grade of 2/6  Pulmonary/Chest: Effort normal. He has no wheezes. He has rales (bibailar dry rales). He exhibits no tenderness.  Abdominal: Soft. Bowel sounds are normal. There is no tenderness.  Musculoskeletal: Normal range of motion. He exhibits no edema or tenderness.  Lymphadenopathy:    He has no cervical adenopathy.  Neurological: He is alert and oriented to person, place, and time. He has normal reflexes. No cranial nerve deficit. He exhibits normal muscle tone. Coordination normal.  Skin: Skin is warm and dry. No rash noted. No erythema.     Psychiatric: He has a normal mood and affect. His behavior is normal. Judgment and thought content normal.    Filed Vitals:   09/10/14 1115  BP: 104/65  Pulse: 50  Temp: 96.6 F (35.9 C)  TempSrc: Tympanic  Resp: 20      Labs reviewed: Basic Metabolic Panel:  Recent Labs  12/07/13 03/22/14 07/05/14  NA 136* 138 137  K 4.4 4.4 4.6  BUN 62* 72* 91*  CREATININE 2.6* 2.7* 2.7*  TSH 3.06 4.43 4.30   Liver Function Tests:  Recent Labs  07/05/14  AST 19  ALT 18  ALKPHOS 94   No results for input(s): LIPASE, AMYLASE in the last 8760 hours. No results for input(s): AMMONIA in the last 8760 hours. CBC:  Recent Labs  03/22/14 07/05/14 07/19/14  WBC 7.1 6.2 8.0  HGB 9.9* 8.8* 9.9*  HCT 30* 26* 101*  PLT 178 210 194   Lipid Panel: No results for input(s): CHOL, HDL, LDLCALC, TRIG, CHOLHDL, LDLDIRECT in the last 8760 hours.  Past  Procedures:  08/09/13 CXR minimal cardiomegaly appears new without pulmonary vascular congestion or pleural effusion, mild elevation left  hemidiaphragm unchanged, no inflammatory consolidate or suspicious nodule, mild atelectasis right lung base and left perihilar region.  Assessment/Plan Anemia Dc Vit B12 1063mcg po daily-plan of care is comfort measures only. 07/19/14 Hgb 9.9    Blepharitis of both eyes Stable.    Chronic fatigue and malaise Worsening-comfort measures.   Chronic renal disease, stage III 03/22/14 Bun/creat 72/2.69 07/05/14 Hgb a1c 6.7, creatinine 2.69    Constipation Standing order available to him    Coronary atherosclerosis Dc ASA and Lipitor-comfort measures only.    Essential hypertension Low measurements   GERD (gastroesophageal reflux disease) Dc Omeprazole-he is asymptomatic of GERD   Hypothyroidism Dc Levothyroxine-no longer beneficial in his plan of care .   Memory loss Rapidly decline with focal neurological deficits-multifactorials-comfort measures with Morphine and Lorazepam prn for now.    Osteoarthritis, multiple sites Dc Tylenol. Prn Morphine available to him   Paroxysmal supraventricular tachycardia Dc Amiodarone-HR in 60s.    Type 2 diabetes mellitus with diabetic chronic kidney disease Dc Insulin and CBG checks-comfort purpose.      Family/ Staff Communication: observe the patient  Goals of Care: SNF  Labs/tests ordered: no further diagnostic testing.

## 2014-09-10 NOTE — Assessment & Plan Note (Signed)
Stable

## 2014-09-10 NOTE — Assessment & Plan Note (Signed)
Dc Vit B12 1037mcg po daily-plan of care is comfort measures only. 07/19/14 Hgb 9.9

## 2014-09-10 NOTE — Assessment & Plan Note (Signed)
Dc Insulin and CBG checks-comfort purpose.

## 2014-09-10 NOTE — Assessment & Plan Note (Signed)
Dc ASA and Lipitor-comfort measures only.

## 2014-09-10 NOTE — Assessment & Plan Note (Signed)
Dc Amiodarone-HR in 41s.

## 2014-09-30 DEATH — deceased

## 2015-04-11 NOTE — Progress Notes (Signed)
This encounter was created in error - please disregard.
